# Patient Record
Sex: Female | Born: 1960
Health system: Southern US, Community
[De-identification: ages and names within clinical notes are randomized; demographics above are authoritative.]

## PROBLEM LIST (undated history)

## (undated) DIAGNOSIS — E785 Hyperlipidemia, unspecified: Secondary | ICD-10-CM

## (undated) DIAGNOSIS — I4891 Unspecified atrial fibrillation: Secondary | ICD-10-CM

## (undated) DIAGNOSIS — R202 Paresthesia of skin: Secondary | ICD-10-CM

## (undated) DIAGNOSIS — K219 Gastro-esophageal reflux disease without esophagitis: Secondary | ICD-10-CM

## (undated) DIAGNOSIS — M797 Fibromyalgia: Secondary | ICD-10-CM

## (undated) DIAGNOSIS — E119 Type 2 diabetes mellitus without complications: Secondary | ICD-10-CM

## (undated) DIAGNOSIS — R200 Anesthesia of skin: Secondary | ICD-10-CM

## (undated) DIAGNOSIS — K449 Diaphragmatic hernia without obstruction or gangrene: Secondary | ICD-10-CM

## (undated) DIAGNOSIS — F988 Other specified behavioral and emotional disorders with onset usually occurring in childhood and adolescence: Secondary | ICD-10-CM

## (undated) DIAGNOSIS — E079 Disorder of thyroid, unspecified: Secondary | ICD-10-CM

## (undated) DIAGNOSIS — E279 Disorder of adrenal gland, unspecified: Secondary | ICD-10-CM

## (undated) DIAGNOSIS — D509 Iron deficiency anemia, unspecified: Secondary | ICD-10-CM

## (undated) DIAGNOSIS — E669 Obesity, unspecified: Secondary | ICD-10-CM

## (undated) HISTORY — DX: Paresthesia of skin: R20.0

## (undated) HISTORY — DX: Obesity, unspecified: E66.9

## (undated) HISTORY — PX: KNEE SURGERY: SHX244

## (undated) HISTORY — PX: TONSILLECTOMY: SUR1361

## (undated) HISTORY — DX: Hyperlipidemia, unspecified: E78.5

## (undated) HISTORY — DX: Fibromyalgia: M79.7

## (undated) HISTORY — PX: ABDOMINAL HYSTERECTOMY: SHX81

## (undated) HISTORY — DX: Anesthesia of skin: R20.2

## (undated) HISTORY — PX: SHOULDER SURGERY: SHX246

## (undated) HISTORY — DX: Iron deficiency anemia, unspecified: D50.9

## (undated) HISTORY — DX: Gastro-esophageal reflux disease without esophagitis: K21.9

## (undated) HISTORY — PX: COLONOSCOPY: SHX174

## (undated) HISTORY — DX: Other specified behavioral and emotional disorders with onset usually occurring in childhood and adolescence: F98.8

## (undated) HISTORY — DX: Diaphragmatic hernia without obstruction or gangrene: K44.9

---

## 1999-02-17 ENCOUNTER — Emergency Department (HOSPITAL_COMMUNITY): Admission: EM | Admit: 1999-02-17 | Discharge: 1999-02-17 | Payer: Self-pay | Admitting: Internal Medicine

## 1999-10-19 ENCOUNTER — Emergency Department (HOSPITAL_COMMUNITY): Admission: EM | Admit: 1999-10-19 | Discharge: 1999-10-19 | Payer: Self-pay | Admitting: Emergency Medicine

## 1999-12-26 ENCOUNTER — Encounter: Admission: RE | Admit: 1999-12-26 | Discharge: 1999-12-26 | Payer: Self-pay | Admitting: Obstetrics & Gynecology

## 1999-12-26 ENCOUNTER — Encounter: Payer: Self-pay | Admitting: Obstetrics & Gynecology

## 2000-01-09 ENCOUNTER — Encounter: Payer: Self-pay | Admitting: Internal Medicine

## 2000-01-09 ENCOUNTER — Ambulatory Visit (HOSPITAL_COMMUNITY): Admission: RE | Admit: 2000-01-09 | Discharge: 2000-01-09 | Payer: Self-pay | Admitting: Internal Medicine

## 2000-02-06 ENCOUNTER — Encounter: Payer: Self-pay | Admitting: Internal Medicine

## 2000-02-06 ENCOUNTER — Encounter: Admission: RE | Admit: 2000-02-06 | Discharge: 2000-02-06 | Payer: Self-pay | Admitting: Internal Medicine

## 2000-02-13 ENCOUNTER — Encounter: Payer: Self-pay | Admitting: Internal Medicine

## 2000-02-13 ENCOUNTER — Encounter: Admission: RE | Admit: 2000-02-13 | Discharge: 2000-02-13 | Payer: Self-pay | Admitting: Internal Medicine

## 2000-06-09 ENCOUNTER — Encounter: Payer: Self-pay | Admitting: Emergency Medicine

## 2000-06-09 ENCOUNTER — Inpatient Hospital Stay (HOSPITAL_COMMUNITY): Admission: EM | Admit: 2000-06-09 | Discharge: 2000-06-11 | Payer: Self-pay | Admitting: Emergency Medicine

## 2000-07-19 ENCOUNTER — Emergency Department (HOSPITAL_COMMUNITY): Admission: EM | Admit: 2000-07-19 | Discharge: 2000-07-19 | Payer: Self-pay | Admitting: Emergency Medicine

## 2000-07-19 ENCOUNTER — Encounter: Payer: Self-pay | Admitting: Emergency Medicine

## 2000-12-17 ENCOUNTER — Other Ambulatory Visit: Admission: RE | Admit: 2000-12-17 | Discharge: 2000-12-17 | Payer: Self-pay | Admitting: Family Medicine

## 2004-12-05 ENCOUNTER — Encounter (INDEPENDENT_AMBULATORY_CARE_PROVIDER_SITE_OTHER): Payer: Self-pay | Admitting: Specialist

## 2004-12-05 ENCOUNTER — Observation Stay (HOSPITAL_COMMUNITY): Admission: RE | Admit: 2004-12-05 | Discharge: 2004-12-06 | Payer: Self-pay | Admitting: Obstetrics & Gynecology

## 2004-12-09 ENCOUNTER — Inpatient Hospital Stay (HOSPITAL_COMMUNITY): Admission: AD | Admit: 2004-12-09 | Discharge: 2004-12-09 | Payer: Self-pay | Admitting: Obstetrics and Gynecology

## 2005-02-04 ENCOUNTER — Ambulatory Visit: Payer: Self-pay | Admitting: Family Medicine

## 2005-03-13 ENCOUNTER — Encounter: Admission: RE | Admit: 2005-03-13 | Discharge: 2005-03-13 | Payer: Self-pay | Admitting: Family Medicine

## 2005-03-13 ENCOUNTER — Ambulatory Visit: Payer: Self-pay | Admitting: Family Medicine

## 2005-09-12 ENCOUNTER — Ambulatory Visit: Payer: Self-pay | Admitting: Family Medicine

## 2006-03-13 ENCOUNTER — Ambulatory Visit: Payer: Self-pay | Admitting: Family Medicine

## 2006-03-20 ENCOUNTER — Ambulatory Visit: Payer: Self-pay | Admitting: Family Medicine

## 2006-04-17 ENCOUNTER — Ambulatory Visit: Payer: Self-pay | Admitting: Family Medicine

## 2006-04-17 LAB — CONVERTED CEMR LAB
ALT: 10 units/L (ref 0–40)
AST: 15 units/L (ref 0–37)
Albumin: 3.7 g/dL (ref 3.5–5.2)
Alkaline Phosphatase: 73 units/L (ref 39–117)
BUN: 7 mg/dL (ref 6–23)
Basophils Absolute: 0 10*3/uL (ref 0.0–0.1)
Basophils Relative: 0.9 % (ref 0.0–1.0)
Bilirubin, Direct: 0.1 mg/dL (ref 0.0–0.3)
CO2: 30 meq/L (ref 19–32)
Calcium: 9.1 mg/dL (ref 8.4–10.5)
Chloride: 104 meq/L (ref 96–112)
Cholesterol: 208 mg/dL (ref 0–200)
Creatinine, Ser: 0.7 mg/dL (ref 0.4–1.2)
Direct LDL: 143.6 mg/dL
Eosinophils Absolute: 0.2 10*3/uL (ref 0.0–0.6)
Eosinophils Relative: 4 % (ref 0.0–5.0)
GFR calc Af Amer: 116 mL/min
GFR calc non Af Amer: 96 mL/min
Glucose, Bld: 96 mg/dL (ref 70–99)
HCT: 41.4 % (ref 36.0–46.0)
HDL: 43.9 mg/dL (ref >39.0)
Hemoglobin: 14.3 g/dL (ref 12.0–15.0)
Lymphocytes Relative: 36.2 % (ref 12.0–46.0)
MCHC: 34.5 g/dL (ref 30.0–36.0)
MCV: 88.5 fL (ref 78.0–100.0)
Monocytes Absolute: 0.3 10*3/uL (ref 0.2–0.7)
Monocytes Relative: 8.7 % (ref 3.0–11.0)
Neutro Abs: 2 10*3/uL (ref 1.4–7.7)
Neutrophils Relative %: 50.2 % (ref 43.0–77.0)
Platelets: 289 10*3/uL (ref 150–400)
Potassium: 3.6 meq/L (ref 3.5–5.1)
RBC: 4.68 M/uL (ref 3.87–5.11)
RDW: 13.2 % (ref 11.5–14.6)
Sodium: 139 meq/L (ref 135–145)
TSH: 1.94 microintl units/mL (ref 0.35–5.50)
Total Bilirubin: 0.8 mg/dL (ref 0.3–1.2)
Total CHOL/HDL Ratio: 4.7
Total Protein: 6.7 g/dL (ref 6.0–8.3)
Triglycerides: 74 mg/dL (ref 0–149)
VLDL: 15 mg/dL (ref 0–40)
WBC: 3.9 10*3/uL — ABNORMAL LOW (ref 4.5–10.5)

## 2006-06-03 ENCOUNTER — Ambulatory Visit: Payer: Self-pay | Admitting: Family Medicine

## 2006-06-25 ENCOUNTER — Ambulatory Visit: Payer: Self-pay | Admitting: Family Medicine

## 2006-12-15 ENCOUNTER — Telehealth: Payer: Self-pay | Admitting: Family Medicine

## 2006-12-18 ENCOUNTER — Encounter: Payer: Self-pay | Admitting: Family Medicine

## 2007-01-13 DIAGNOSIS — J4530 Mild persistent asthma, uncomplicated: Secondary | ICD-10-CM | POA: Insufficient documentation

## 2007-01-13 DIAGNOSIS — D509 Iron deficiency anemia, unspecified: Secondary | ICD-10-CM | POA: Insufficient documentation

## 2007-01-19 ENCOUNTER — Telehealth: Payer: Self-pay | Admitting: *Deleted

## 2007-11-09 ENCOUNTER — Telehealth: Payer: Self-pay | Admitting: *Deleted

## 2007-11-30 ENCOUNTER — Ambulatory Visit: Payer: Self-pay | Admitting: Family Medicine

## 2007-11-30 DIAGNOSIS — F988 Other specified behavioral and emotional disorders with onset usually occurring in childhood and adolescence: Secondary | ICD-10-CM | POA: Insufficient documentation

## 2007-12-07 ENCOUNTER — Telehealth: Payer: Self-pay | Admitting: Family Medicine

## 2007-12-23 ENCOUNTER — Encounter: Payer: Self-pay | Admitting: Family Medicine

## 2008-07-19 ENCOUNTER — Telehealth: Payer: Self-pay | Admitting: Family Medicine

## 2008-08-05 ENCOUNTER — Telehealth: Payer: Self-pay | Admitting: Family Medicine

## 2008-09-07 ENCOUNTER — Ambulatory Visit: Payer: Self-pay | Admitting: Family Medicine

## 2008-09-07 LAB — CONVERTED CEMR LAB
ALT: 10 units/L (ref 0–35)
AST: 17 units/L (ref 0–37)
Albumin: 3.6 g/dL (ref 3.5–5.2)
Alkaline Phosphatase: 69 units/L (ref 39–117)
BUN: 9 mg/dL (ref 6–23)
Basophils Absolute: 0 10*3/uL (ref 0.0–0.1)
Basophils Relative: 0.3 % (ref 0.0–3.0)
Bilirubin Urine: NEGATIVE
Bilirubin, Direct: 0.1 mg/dL (ref 0.0–0.3)
Blood in Urine, dipstick: NEGATIVE
CO2: 31 meq/L (ref 19–32)
Calcium: 8.9 mg/dL (ref 8.4–10.5)
Chloride: 105 meq/L (ref 96–112)
Cholesterol: 192 mg/dL (ref 0–200)
Creatinine, Ser: 0.7 mg/dL (ref 0.4–1.2)
Eosinophils Absolute: 0.2 10*3/uL (ref 0.0–0.7)
Eosinophils Relative: 4.9 % (ref 0.0–5.0)
GFR calc non Af Amer: 114.63 mL/min (ref >60)
Glucose, Bld: 93 mg/dL (ref 70–99)
Glucose, Urine, Semiquant: NEGATIVE
HCT: 40.2 % (ref 36.0–46.0)
HDL: 46.1 mg/dL (ref >39.00)
Hemoglobin: 13.8 g/dL (ref 12.0–15.0)
Ketones, urine, test strip: NEGATIVE
LDL Cholesterol: 131 mg/dL — ABNORMAL HIGH (ref 0–99)
Lymphocytes Relative: 39.8 % (ref 12.0–46.0)
Lymphs Abs: 2 10*3/uL (ref 0.7–4.0)
MCHC: 34.5 g/dL (ref 30.0–36.0)
MCV: 90.2 fL (ref 78.0–100.0)
Monocytes Absolute: 0.4 10*3/uL (ref 0.1–1.0)
Monocytes Relative: 7.4 % (ref 3.0–12.0)
Neutro Abs: 2.5 10*3/uL (ref 1.4–7.7)
Neutrophils Relative %: 47.6 % (ref 43.0–77.0)
Nitrite: NEGATIVE
Platelets: 249 10*3/uL (ref 150.0–400.0)
Potassium: 3.8 meq/L (ref 3.5–5.1)
RBC: 4.45 M/uL (ref 3.87–5.11)
RDW: 13.6 % (ref 11.5–14.6)
Sodium: 142 meq/L (ref 135–145)
Specific Gravity, Urine: 1.02
TSH: 1.73 microintl units/mL (ref 0.35–5.50)
Total Bilirubin: 1.1 mg/dL (ref 0.3–1.2)
Total CHOL/HDL Ratio: 4
Total Protein: 6.7 g/dL (ref 6.0–8.3)
Triglycerides: 76 mg/dL (ref 0.0–149.0)
Urobilinogen, UA: 4
VLDL: 15.2 mg/dL (ref 0.0–40.0)
WBC Urine, dipstick: NEGATIVE
WBC: 5.1 10*3/uL (ref 4.5–10.5)
pH: 7

## 2008-09-20 ENCOUNTER — Ambulatory Visit: Payer: Self-pay | Admitting: Internal Medicine

## 2008-10-05 ENCOUNTER — Ambulatory Visit: Payer: Self-pay | Admitting: Family Medicine

## 2008-10-11 ENCOUNTER — Ambulatory Visit: Payer: Self-pay | Admitting: Internal Medicine

## 2008-10-20 ENCOUNTER — Telehealth: Payer: Self-pay | Admitting: *Deleted

## 2008-11-04 ENCOUNTER — Ambulatory Visit: Payer: Self-pay | Admitting: Internal Medicine

## 2008-11-04 ENCOUNTER — Encounter: Payer: Self-pay | Admitting: Family Medicine

## 2009-01-06 ENCOUNTER — Encounter: Payer: Self-pay | Admitting: *Deleted

## 2009-03-03 ENCOUNTER — Encounter (INDEPENDENT_AMBULATORY_CARE_PROVIDER_SITE_OTHER): Payer: Self-pay | Admitting: *Deleted

## 2009-03-06 ENCOUNTER — Telehealth: Payer: Self-pay | Admitting: Family Medicine

## 2009-04-21 DIAGNOSIS — M5020 Other cervical disc displacement, unspecified cervical region: Secondary | ICD-10-CM | POA: Insufficient documentation

## 2009-04-25 ENCOUNTER — Encounter: Payer: Self-pay | Admitting: Family Medicine

## 2009-04-26 ENCOUNTER — Ambulatory Visit: Payer: Self-pay | Admitting: Family Medicine

## 2009-04-26 ENCOUNTER — Telehealth: Payer: Self-pay | Admitting: Family Medicine

## 2009-05-01 ENCOUNTER — Telehealth: Payer: Self-pay | Admitting: Family Medicine

## 2009-05-02 ENCOUNTER — Telehealth: Payer: Self-pay | Admitting: Family Medicine

## 2009-05-03 ENCOUNTER — Ambulatory Visit: Payer: Self-pay | Admitting: Family Medicine

## 2009-05-15 ENCOUNTER — Telehealth (INDEPENDENT_AMBULATORY_CARE_PROVIDER_SITE_OTHER): Payer: Self-pay | Admitting: *Deleted

## 2009-06-06 ENCOUNTER — Encounter: Payer: Self-pay | Admitting: Family Medicine

## 2009-06-23 ENCOUNTER — Telehealth: Payer: Self-pay | Admitting: Family Medicine

## 2009-07-13 ENCOUNTER — Emergency Department (HOSPITAL_BASED_OUTPATIENT_CLINIC_OR_DEPARTMENT_OTHER): Admission: EM | Admit: 2009-07-13 | Discharge: 2009-07-13 | Payer: Self-pay | Admitting: Emergency Medicine

## 2009-07-28 ENCOUNTER — Ambulatory Visit: Payer: Self-pay | Admitting: Family Medicine

## 2009-07-28 ENCOUNTER — Telehealth: Payer: Self-pay | Admitting: Internal Medicine

## 2009-07-31 ENCOUNTER — Telehealth: Payer: Self-pay | Admitting: Family Medicine

## 2009-08-01 ENCOUNTER — Ambulatory Visit: Payer: Self-pay | Admitting: Family Medicine

## 2009-10-26 ENCOUNTER — Telehealth: Payer: Self-pay | Admitting: Family Medicine

## 2009-11-24 ENCOUNTER — Ambulatory Visit: Payer: Self-pay | Admitting: Family Medicine

## 2009-11-24 DIAGNOSIS — R1013 Epigastric pain: Secondary | ICD-10-CM | POA: Insufficient documentation

## 2009-12-05 ENCOUNTER — Encounter: Admission: RE | Admit: 2009-12-05 | Discharge: 2009-12-05 | Payer: Self-pay | Admitting: Family Medicine

## 2009-12-07 ENCOUNTER — Telehealth: Payer: Self-pay | Admitting: Family Medicine

## 2009-12-12 ENCOUNTER — Ambulatory Visit: Payer: Self-pay | Admitting: Family Medicine

## 2010-01-03 ENCOUNTER — Telehealth: Payer: Self-pay | Admitting: Family Medicine

## 2010-01-13 ENCOUNTER — Emergency Department (HOSPITAL_BASED_OUTPATIENT_CLINIC_OR_DEPARTMENT_OTHER): Admission: EM | Admit: 2010-01-13 | Discharge: 2010-01-13 | Payer: Self-pay | Admitting: Emergency Medicine

## 2010-01-13 ENCOUNTER — Ambulatory Visit: Payer: Self-pay | Admitting: Diagnostic Radiology

## 2010-02-01 ENCOUNTER — Ambulatory Visit: Payer: Self-pay | Admitting: Family Medicine

## 2010-02-01 DIAGNOSIS — M2390 Unspecified internal derangement of unspecified knee: Secondary | ICD-10-CM | POA: Insufficient documentation

## 2010-02-05 ENCOUNTER — Encounter (INDEPENDENT_AMBULATORY_CARE_PROVIDER_SITE_OTHER): Payer: Self-pay | Admitting: *Deleted

## 2010-02-05 ENCOUNTER — Encounter: Payer: Self-pay | Admitting: Family Medicine

## 2010-02-23 ENCOUNTER — Encounter: Payer: Self-pay | Admitting: Family Medicine

## 2010-03-02 ENCOUNTER — Encounter: Payer: Self-pay | Admitting: Family Medicine

## 2010-04-10 ENCOUNTER — Telehealth: Payer: Self-pay | Admitting: Family Medicine

## 2010-04-11 ENCOUNTER — Telehealth: Payer: Self-pay | Admitting: Family Medicine

## 2010-04-17 NOTE — Assessment & Plan Note (Signed)
Summary: asthma/dm   Vital Signs:  Patient profile:   50 year old female Height:      63 inches Weight:      172 pounds BMI:     30.58 Temp:     98.2 degrees F oral BP sitting:   110 / 78  (left arm) Cuff size:   regular  Vitals Entered By: Kern Reap CMA (September 07, 2008 9:35 AM)  Reason for Visit asthma  History of Present Illness: Carrie [name] is a 50 year old female, nonsmoker, with a history of severe asthma requiring multiple admissions in her teenage years with 3 or more episodes of ventilator care.  The comes in today with a 7-day history of wheezing.  Her current medications are Singulair 10 mg daily and Provera, one or two puffs b.i.d., p.r.n.  She, says she's not changed her medicine, except she we started Ventolin.  She feels much better than she did last week.  Her last severe episode was over a year ago.  That episode.  We treated with oral prednisone.  She is due for complete physical examination.  Allergies: 1)  Penicillin G Potassium (Penicillin G Potassium) 2)  Aspirin (Aspirin) 3)  Codeine Phosphate (Codeine Phosphate)  Past History:  Past medical, surgical, family and social histories (including risk factors) reviewed, and no changes noted (except as noted below).  Past Medical History: Reviewed history from 01/13/2007 and no changes required. ADD Fibromyalgia MHA Obese Anemia-iron deficiency Asthma  Past Surgical History: Reviewed history from 01/13/2007 and no changes required. CB x2 Colonoscopy  Family History: Reviewed history from 01/13/2007 and no changes required. Family History of Asthma Family History of Colon CA 1st degree relative <60 Family History Lung cancer Family History of Respiratory disease  Social History: Reviewed history from 01/13/2007 and no changes required. Occupation: Married Never Smoked Alcohol use-yes  Review of Systems      See HPI  Physical Exam  General:  Well-developed,well-nourished,in no acute distress;  alert,appropriate and cooperative throughout examination Head:  Normocephalic and atraumatic without obvious abnormalities. No apparent alopecia or balding. Eyes:  No corneal or conjunctival inflammation noted. EOMI. Perrla. Funduscopic exam benign, without hemorrhages, exudates or papilledema. Vision grossly normal. Ears:  External ear exam shows no significant lesions or deformities.  Otoscopic examination reveals clear canals, tympanic membranes are intact bilaterally without bulging, retraction, inflammation or discharge. Hearing is grossly normal bilaterally. Nose:  External nasal examination shows no deformity or inflammation. Nasal mucosa are pink and moist without lesions or exudates. Mouth:  Oral mucosa and oropharynx without lesions or exudates.  Teeth in good repair. Neck:  No deformities, masses, or tenderness noted. Chest Wall:  No deformities, masses, or tenderness noted. Lungs:  symmetrical breath sounds, late expiratory wheezing   Impression & Recommendations:  Problem # 1:  ASTHMA (ICD-493.90) Assessment Deteriorated  Her updated medication list for this problem includes:    Singulair 10 Mg Tabs (Montelukast sodium) .Marland Kitchen... Take 1 tablet by mouth every morning    Proair Hfa 108 (90 Base) Mcg/act Aers (Albuterol sulfate) .Marland Kitchen..Marland Kitchen Two puffs as needed for asthma    Qvar 80 Mcg/act Aers (Beclomethasone dipropionate) .Marland Kitchen... 2 ps two times a day  Orders: Prescription Created Electronically [phone]) Venipuncture (60454) UA Dipstick w/o Micro (automated)  (81003) TLB-Lipid Panel (80061-LIPID) TLB-BMP (Basic Metabolic Panel-BMET) (80048-METABOL) TLB-CBC Platelet - w/Differential (85025-CBCD) TLB-Hepatic/Liver Function Pnl (80076-HEPATIC) TLB-TSH (Thyroid Stimulating Hormone) (84443-TSH)  Complete Medication List: 1)  Detrol La 2 Mg Cp24 (Tolterodine tartrate) .... Take 1 capsule by mouth  once a day 2)  Hydrochlorothiazide 25 Mg Tabs (Hydrochlorothiazide) .... Take 1 tablet by mouth  every morning 3)  Adderall 10 Mg Tabs (Amphetamine-dextroamphetamine) .... Take by mouth @ 1 pm 4)  Adderall Xr 20 Mg Cp24 (Amphetamine-dextroamphetamine) .... Take 2 tabs every am 5)  Adderall Xr 20 Mg Cp24 (Amphetamine-dextroamphetamine) .... Take 2 tabs every am  fill in one month 6)  Singulair 10 Mg Tabs (Montelukast sodium) .... Take 1 tablet by mouth every morning 7)  Proair Hfa 108 (90 Base) Mcg/act Aers (Albuterol sulfate) .... Two puffs as needed for asthma 8)  Adderall 10 Mg Tabs (Amphetamine-dextroamphetamine) .... Take one tab at 1 pm  fill in one month 9)  Adderall 10 Mg Tabs (Amphetamine-dextroamphetamine) .... Take one tab at 1 pm  fill in two months 10)  Adderall Xr 20 Mg Xr24h-cap (Amphetamine-dextroamphetamine) .... Take 2 tabs every morning  fill in two months 11)  Qvar 80 Mcg/act Aers (Beclomethasone dipropionate) .... 2 ps two times a day  Other Orders: Gastroenterology Referral (GI)  Patient Instructions: 1)  add Qvar two puffs b.i.d.Marland Kitchen 2)  Call Dr. Reather Converse for an appointment for consultation. 3)  We will get your lab work today.  Set up a time, the third or fourth week in July for a 30 minute appointment. 4)  I will also put in a request for GI concerning your colonoscopy  Prescriptions: QVAR 80 MCG/ACT AERS (BECLOMETHASONE DIPROPIONATE) 2 ps two times a day  #3 x 3   Entered and Authorized by:   Roderick Pee MD   Signed by:   Roderick Pee MD on 09/07/2008   Method used:   Electronically to        [facility]       [address]       [place], [place]  [address]       Ph: [phone] or [phone]       Fax: [phone]   RxID:   [id number]   Laboratory Results   Urine Tests    Routine Urinalysis   Color: orange Appearance: Cloudy Glucose: negative   (Normal Range: Negative) Bilirubin: negative   (Normal Range: Negative) Ketone: negative   (Normal Range: Negative) Spec. Gravity: 1.020   (Normal Range: 1.003-1.035) Blood:  negative   (Normal Range: Negative) pH: 7.0   (Normal Range: 5.0-8.0) Protein: 1+   (Normal Range: Negative) Urobilinogen: 4.0   (Normal Range: 0-1) Nitrite: negative   (Normal Range: Negative) Leukocyte Esterace: negative   (Normal Range: Negative)    Comments: Joanne Chars CMA  September 07, 2008 1:32 PM

## 2010-04-17 NOTE — Miscellaneous (Signed)
Summary: mammogram update  Clinical Lists Changes  Observations: Added new observation of MAMMOGRAM: normal (01/06/2009 16:43)      Preventive Care Screening  Mammogram:    Date:  01/06/2009    Results:  normal

## 2010-04-17 NOTE — Letter (Signed)
Summary: Generic Letter  [facility]  [address] [place], [place] [address]   Phone: [phone]  Fax: [phone]    02/05/2010  [facility] [address] [place], [place]  [address]  To Whom it May Concern,  Our patient was seen in our office 02-01-2010.  She has been medically cleared for surgery. If you have any questions or concerns please feel free to call our office.           Sincerely,   Kelle Darting, MD

## 2010-04-17 NOTE — Progress Notes (Signed)
Summary: Call-A-Nurse Report    Call-A-Nurse Triage Call Report Triage Record Num: [id number] Operator: Jacques Navy Patient Name: Lynda Wanninger Call Date & Time: 07/28/2009 7:09:05PM Patient Phone: [phone] PCP: Eugenio Hoes. Tayra Dawe Patient Gender: Female PCP Fax : [phone] Patient DOB: Nov 21, 1960 [facility] Name: [facility] Reason for Call: Seen Dr. Tawanna Cooler 07-28-09 and 4 scripts were suppose to be called to pharmacy and one was missed. Needs the QVar inhaler 2puffs daily still. Paged Dr. Alphonsus Sias @ [phone] 07-28-09.Orders received to fill. [facility]ses [facility] [phone] @ [phone] 07-28-09 and spoke to Liborio Nixon, Teacher, early years/pre. Protocol(s) Used: PCP Calls, No Triage (Adult) Recommended Outcome per Protocol: Call Provider Immediately Reason for Outcome: [1] Follow-up call from parent regarding patient's clinical status AND [2] information urgent Care Advice:  ~ 07/28/2009 7:19:25PM Page 1 of 1 [email or web address]

## 2010-04-17 NOTE — Progress Notes (Signed)
Summary: please call  Phone Note Call from Patient   Caller: Patient Call For: Roderick Pee MD Summary of Call: pt is requesting rachel to call her at cell # [phone] Initial call taken by: Heron Sabins,  May 02, 2009 5:08 PM  Follow-up for Phone Call        Called pt at [phone] and left msg to call back..... WIll attempt to contact pt again,  later today. Follow-up by: Debbra Riding,  May 03, 2009 9:34 AM  Additional Follow-up for Phone Call Additional follow up Details #1::        Phone Call Completed-----Pt called and wanted to ck status of disability paperwork to [facility] Claims.... Pt wanted to know about her RTW date... Pt was adv that same was being left open at this time.... Pt acknowledged same. Additional Follow-up by: Debbra Riding,  May 03, 2009 10:03 AM

## 2010-04-17 NOTE — Letter (Signed)
Summary: Physician's Certification for Disability Benefits  Physician's Certification for Disability Benefits   Imported By: Maryln Gottron 05/09/2009 12:32:07  _____________________________________________________________________  External Attachment:    Type:   Image     Comment:   External Document

## 2010-04-17 NOTE — Progress Notes (Signed)
  Phone Note Call from Patient Call back at Work Phone [phone]   Caller: Patient Call For: Roderick Pee MD Reason for Call: Lab or Test Results Action Taken: Provider Notified Summary of Call: Pt is asking for [place] results, please. [phone] Initial call taken by: Lynann Beaver CMA,  December 07, 2009 3:11 PM  Follow-up for Phone Call        I reviewed her test results.  Ultrasound the gallbladder, and the abdomen is normal except for a cystic lesion left kidney.  Recommend follow-up in 12 months.  Patient informed of this fact.  Return next week for follow-up on the asthma Follow-up by: Roderick Pee MD,  December 08, 2009 1:42 PM

## 2010-04-17 NOTE — Progress Notes (Signed)
Summary: rxs pt will be out of meds monday  Phone Note Call from Patient Call back at Home Phone [phone] Call back at [phone]   Caller: Patient Call For: dr todd Summary of Call: pt will be out of meds on monday. adderall xr 20 mg and adderall 10 mg Initial call taken by: Heron Sabins,  Aug 05, 2008 10:58 AM  Follow-up for Phone Call        rx ready. left message on machine for patient  Follow-up by: Kern Reap CMA,  Aug 08, 2008 9:32 AM    New/Updated Medications: ADDERALL XR 20 MG  CP24 (AMPHETAMINE-DEXTROAMPHETAMINE) Take 2 tabs every AM  fill in one month ADDERALL 10 MG TABS (AMPHETAMINE-DEXTROAMPHETAMINE) take one tab at 1 pm  fill in one month ADDERALL 10 MG TABS (AMPHETAMINE-DEXTROAMPHETAMINE) take one tab at 1 pm  fill in two months ADDERALL XR 20 MG XR24H-CAP (AMPHETAMINE-DEXTROAMPHETAMINE) take 2 tabs every morning  fill in two months   Prescriptions: ADDERALL XR 20 MG XR24H-CAP (AMPHETAMINE-DEXTROAMPHETAMINE) take 2 tabs every morning  fill in two months  #60 x 0   Entered by:   Kern Reap CMA   Authorized by:   Roderick Pee MD   Signed by:   Kern Reap CMA on 08/08/2008   Method used:   Print then Give to Patient   RxID:   [id number] ADDERALL XR 20 MG  CP24 (AMPHETAMINE-DEXTROAMPHETAMINE) Take 2 tabs every AM  fill in one month  #60 x 0   Entered by:   Kern Reap CMA   Authorized by:   Roderick Pee MD   Signed by:   Kern Reap CMA on 08/08/2008   Method used:   Print then Give to Patient   RxID:   [id number] ADDERALL XR 20 MG  CP24 (AMPHETAMINE-DEXTROAMPHETAMINE) Take 2 tabs every AM  #60 x 0   Entered by:   Kern Reap CMA   Authorized by:   Roderick Pee MD   Signed by:   Kern Reap CMA on 08/08/2008   Method used:   Print then Give to Patient   RxID:   [id number] ADDERALL 10 MG TABS (AMPHETAMINE-DEXTROAMPHETAMINE) take one tab at 1 pm  fill in two months  #30 x 0   Entered by:   Kern Reap CMA  Authorized by:   Roderick Pee MD   Signed by:   Kern Reap CMA on 08/08/2008   Method used:   Print then Give to Patient   RxID:   [id number] ADDERALL 10 MG TABS (AMPHETAMINE-DEXTROAMPHETAMINE) take one tab at 1 pm  fill in one month  #30 x 0   Entered by:   Kern Reap CMA   Authorized by:   Roderick Pee MD   Signed by:   Kern Reap CMA on 08/08/2008   Method used:   Print then Give to Patient   RxID:   [id number] ADDERALL 10 MG  TABS (AMPHETAMINE-DEXTROAMPHETAMINE) TAKE by mouth @ 1 PM  #30 x 0   Entered by:   Kern Reap CMA   Authorized by:   Roderick Pee MD   Signed by:   Kern Reap CMA on 08/08/2008   Method used:   Print then Give to Patient   RxID:   [id number]

## 2010-04-17 NOTE — Progress Notes (Signed)
  Phone Note Other Incoming   [facility] Summary of Call: concentrated albuterol ordered that they don't stock okay to change to nebulizer vials Initial call taken by: Cindee Salt MD,  Jul 28, 2009 5:43 PM    New/Updated Medications: ALBUTEROL SULFATE (2.5 MG/3ML) 0.083% NEBU (ALBUTEROL SULFATE) 1 via nebulizer up to 4 times daily as needed Prescriptions: ALBUTEROL SULFATE (2.5 MG/3ML) 0.083% NEBU (ALBUTEROL SULFATE) 1 via nebulizer up to 4 times daily as needed  #120 x 0   Entered and Authorized by:   Cindee Salt MD   Signed by:   Cindee Salt MD on 07/28/2009   Method used:   Telephoned to ...       [facility]       [address]       [place], [place]  [address]       Ph: [phone] or [phone]       Fax: [phone]   RxID:   [id number]

## 2010-04-17 NOTE — Assessment & Plan Note (Signed)
Summary: cpx/mhf perdrtodde   Vital Signs:  Patient profile:   50 year old female Menstrual status:  hysterectomy Height:      64.5 inches Weight:      173 pounds Temp:     98.2 degrees F oral BP sitting:   110 / 80  (left arm)  Vitals Entered By: Kern Reap CMA (October 05, 2008 8:28 AM)  Reason for Visit cpx  History of Present Illness: Carrie [name] is a 50 year old female, who comes in today for physical evaluation of many issues.  She has a long-standing history of severe asthma.  She said it over 100 hospitalizations for asthma.  She currently takes Singulair 10 mg daily Qvar 80 2 ps  b.i.d. and states she is doing well.  She was counseled to see Dr. Tenny Craw in [place] for an immunological evaluation.  She hasn't gone yet.  Again, encouraged to see Dr. Reather Converse, ASAP.  She also has a history of underlying urinary incontinence.  She takes Detrol 2 mg daily working well.  She also has a history of venous insufficiency.  She is on HCTZ 25 mg daily.  No edema.  BP good 110/80.  She also has a history of ADD.  She currently takes 40 mg of long-acting Adderall in the morning and 10 mg at 1 p.m.Marland Kitchen  She gets routine eye care, yearly excess of the long-term steroid use.  She currently has braces on.  She does BSE monthly and gets annual mammography.  She had her uterus removed in 2006 for fibroids.  Ovaries were left intact.  Tetanus Versed 2003, seasonal flu every fall, pneumonia shot, 08,  She has a colonoscopy set up next week because her mother died of colon cancer.  Allergies: 1)  Penicillin G Potassium (Penicillin G Potassium) 2)  Aspirin (Aspirin) 3)  Codeine Phosphate (Codeine Phosphate)  Past History:  Past medical, surgical, family and social histories (including risk factors) reviewed, and no changes noted (except as noted below).  Past Medical History: Reviewed history from 01/13/2007 and no changes required. ADD Fibromyalgia MHA Obese Anemia-iron  deficiency Asthma  Past Surgical History: Reviewed history from 01/13/2007 and no changes required. CB x2 Colonoscopy  Family History: Reviewed history from 01/13/2007 and no changes required. Family History of Asthma Family History of Colon CA 1st degree relative <60 Family History Lung cancer Family History of Respiratory disease  Social History: Reviewed history from 01/13/2007 and no changes required. Occupation: Married Never Smoked Alcohol use-yes  Review of Systems      See HPI  Physical Exam  General:  Well-developed,well-nourished,in no acute distress; alert,appropriate and cooperative throughout examination Head:  Normocephalic and atraumatic without obvious abnormalities. No apparent alopecia or balding. Eyes:  No corneal or conjunctival inflammation noted. EOMI. Perrla. Funduscopic exam benign, without hemorrhages, exudates or papilledema. Vision grossly normal. Ears:  External ear exam shows no significant lesions or deformities.  Otoscopic examination reveals clear canals, tympanic membranes are intact bilaterally without bulging, retraction, inflammation or discharge. Hearing is grossly normal bilaterally. Nose:  External nasal examination shows no deformity or inflammation. Nasal mucosa are pink and moist without lesions or exudates. Mouth:  Oral mucosa and oropharynx without lesions or exudates.  Teeth in good repair. Neck:  No deformities, masses, or tenderness noted. Chest Wall:  No deformities, masses, or tenderness noted. Breasts:  No mass, nodules, thickening, tenderness, bulging, retraction, inflamation, nipple discharge or skin changes noted.   Lungs:  Normal respiratory effort, chest expands symmetrically. Lungs are clear to  auscultation, no crackles or wheezes. Heart:  Normal rate and regular rhythm. S1 and S2 normal without gallop, murmur, click, rub or other extra sounds. Abdomen:  Bowel sounds positive,abdomen soft and non-tender without masses,  organomegaly or hernias noted. Genitalia:  Pelvic Exam:        External: normal female genitalia without lesions or masses        Vagina: normal without lesions or masses        Cervix: normal without lesions or masses        Adnexa: normal bimanual exam without masses or fullness        Uterus: normal by palpation        Pap smear: not performed Msk:  No deformity or scoliosis noted of thoracic or lumbar spine.   Pulses:  R and L carotid,radial,femoral,dorsalis pedis and posterior tibial pulses are full and equal bilaterally Extremities:  No clubbing, cyanosis, edema, or deformity noted with normal full range of motion of all joints.   Neurologic:  No cranial nerve deficits noted. Station and gait are normal. Plantar reflexes are down-going bilaterally. DTRs are symmetrical throughout. Sensory, motor and coordinative functions appear intact. Skin:  Intact without suspicious lesions or rashestattoo posterior upper left shoulder Cervical Nodes:  No lymphadenopathy noted Axillary Nodes:  No palpable lymphadenopathy Inguinal Nodes:  No significant adenopathy Psych:  Cognition and judgment appear intact. Alert and cooperative with normal attention span and concentration. No apparent delusions, illusions, hallucinations   Impression & Recommendations:  Problem # 1:  ATTENTION DEFICIT DISORDER, ADULT (ICD-314.00) Assessment Improved  Orders: Prescription Created Electronically [phone])  Problem # 2:  ASTHMA (ICD-493.90) Assessment: Improved  Her updated medication list for this problem includes:    Singulair 10 Mg Tabs (Montelukast sodium) .Marland Kitchen... Take 1 tablet by mouth every morning    Proair Hfa 108 (90 Base) Mcg/act Aers (Albuterol sulfate) .Marland Kitchen..Marland Kitchen Two puffs as needed for asthma    Qvar 80 Mcg/act Aers (Beclomethasone dipropionate) .Marland Kitchen... 2 ps two times a day  Orders: Prescription Created Electronically [id number]) T-Bone Densitometry [id number])  Problem # 3:  Preventive Health Care  (ICD-V70.0) Assessment: Comment Only  Complete Medication List: 1)  Detrol La 2 Mg Cp24 (Tolterodine tartrate) .... Take 1 capsule by mouth once a day 2)  Hydrochlorothiazide 25 Mg Tabs (Hydrochlorothiazide) .... Take 1 tablet by mouth every morning 3)  Adderall 10 Mg Tabs (Amphetamine-dextroamphetamine) .... Take by mouth @ 1 pm 4)  Adderall Xr 20 Mg Cp24 (Amphetamine-dextroamphetamine) .... Take 2 tabs every am 5)  Adderall Xr 20 Mg Cp24 (Amphetamine-dextroamphetamine) .... Take 2 tabs every am  fill in one month 6)  Singulair 10 Mg Tabs (Montelukast sodium) .... Take 1 tablet by mouth every morning 7)  Proair Hfa 108 (90 Base) Mcg/act Aers (Albuterol sulfate) .... Two puffs as needed for asthma 8)  Adderall 10 Mg Tabs (Amphetamine-dextroamphetamine) .... Take one tab at 1 pm  fill in one month 9)  Adderall 10 Mg Tabs (Amphetamine-dextroamphetamine) .... Take one tab at 1 pm  fill in two months 10)  Adderall Xr 20 Mg Xr24h-cap (Amphetamine-dextroamphetamine) .... Take 2 tabs every morning  fill in two months 11)  Qvar 80 Mcg/act Aers (Beclomethasone dipropionate) .... 2 ps two times a day 12)  Miralax Powd (Polyethylene glycol 3350) .... As per prep  instructions. 13)  Reglan 10 Mg Tabs (Metoclopramide hcl) .... As per prep instructions. 14)  Dulcolax 5 Mg Tbec (Bisacodyl) .... Day before procedure take 2 at 3pm and 2 at  8pm.  Other Orders: EKG w/ Interpretation (93000)  Patient Instructions: 1)  Please schedule a follow-up appointment in 1 year. 2)  Schedule your mammogram. 3)  Schedule a colonoscopy/sigmoidoscopy to help detect colon cancer. 4)  Take calcium +Vitamin D daily. 5)  Take an Aspirin every day. Prescriptions: ADDERALL XR 20 MG  CP24 (AMPHETAMINE-DEXTROAMPHETAMINE) Take 2 tabs every AM  #60 x 0   Entered and Authorized by:   Roderick Pee MD   Signed by:   Roderick Pee MD on 10/05/2008   Method used:   Print then Give to Patient   RxID:   [id number] ADDERALL  XR 20 MG  CP24 (AMPHETAMINE-DEXTROAMPHETAMINE) Take 2 tabs every AM  #60 x 0   Entered and Authorized by:   Roderick Pee MD   Signed by:   Roderick Pee MD on 10/05/2008   Method used:   Print then Give to Patient   RxID:   [id number] ADDERALL XR 20 MG  CP24 (AMPHETAMINE-DEXTROAMPHETAMINE) Take 2 tabs every AM  #60 x 0   Entered and Authorized by:   Roderick Pee MD   Signed by:   Roderick Pee MD on 10/05/2008   Method used:   Print then Give to Patient   RxID:   [id number] ADDERALL 10 MG  TABS (AMPHETAMINE-DEXTROAMPHETAMINE) TAKE by mouth @ 1 PM  #30 x 0   Entered and Authorized by:   Roderick Pee MD   Signed by:   Roderick Pee MD on 10/05/2008   Method used:   Print then Give to Patient   RxID:   [id number] ADDERALL 10 MG  TABS (AMPHETAMINE-DEXTROAMPHETAMINE) TAKE by mouth @ 1 PM  #30 x 0   Entered and Authorized by:   Roderick Pee MD   Signed by:   Roderick Pee MD on 10/05/2008   Method used:   Print then Give to Patient   RxID:   [id number] ADDERALL 10 MG  TABS (AMPHETAMINE-DEXTROAMPHETAMINE) TAKE by mouth @ 1 PM  #30 x 0   Entered and Authorized by:   Roderick Pee MD   Signed by:   Roderick Pee MD on 10/05/2008   Method used:   Print then Give to Patient   RxID:   [id number] PROAIR HFA 108 (90 BASE) MCG/ACT AERS (ALBUTEROL SULFATE) two puffs as needed for asthma  #1 x 2   Entered and Authorized by:   Roderick Pee MD   Signed by:   Roderick Pee MD on 10/05/2008   Method used:   Electronically to        [facility]       [address]       [place], [place]  [address]       Ph: [phone] or [phone]       Fax: [phone]   RxID:   [id number] SINGULAIR 10 MG TABS (MONTELUKAST SODIUM) Take 1 tablet by mouth every morning  #100 x 3   Entered and Authorized by:   Roderick Pee MD   Signed by:   Roderick Pee MD on 10/05/2008   Method used:   Electronically to        [facility]        [address]       [place], [place]  [address]       Ph: [phone] or [phone]       Fax: [phone]  RxID:   [id number] HYDROCHLOROTHIAZIDE 25 MG TABS (HYDROCHLOROTHIAZIDE) Take 1 tablet by mouth every morning  #100 x 3   Entered and Authorized by:   Roderick Pee MD   Signed by:   Roderick Pee MD on 10/05/2008   Method used:   Electronically to        [facility]       [address]       [place], [place]  [address]       Ph: [phone] or [phone]       Fax: [phone]   RxID:   [id number] DETROL LA 2 MG CP24 (TOLTERODINE TARTRATE) Take 1 capsule by mouth once a day  #100 x 3   Entered and Authorized by:   Roderick Pee MD   Signed by:   Roderick Pee MD on 10/05/2008   Method used:   Electronically to        [facility]       [address]       [place], [place]  [address]       Ph: [phone] or [phone]       Fax: [phone]   RxID:   [id number]

## 2010-04-17 NOTE — Progress Notes (Signed)
Summary: rx refill adderall  Phone Note Call from Patient Call back at [phone]   Caller: patient live Call For: Tawanna Cooler Summary of Call: Patient needs Rx Refill for adderall XR 40mg , adderall 5 or 10mg .   Please call when ready to pickup. Initial call taken by: Celine Ahr,  November 09, 2007 11:24 AM  Follow-up for Phone Call        left message on machine for patient.  patient has not been seen in over a year. she will need an office visit and then be able to pick up rx Follow-up by: Kern Reap CMA,  November 09, 2007 12:30 PM

## 2010-04-17 NOTE — Progress Notes (Signed)
Summary: PT or neurosurgeon?  Phone Note Call from Patient   Summary of Call: patient is calling to see if dr Dhilan Brauer is aware that she is scheduled for PT before she goes to the neurosurgeon.  is this okay?  [phone] Initial call taken by: Kern Reap CMA Duncan Dull),  May 01, 2009 3:56 PM  Follow-up for Phone Call        yes Follow-up by: Roderick Pee MD,  May 01, 2009 5:10 PM  Additional Follow-up for Phone Call Additional follow up Details #1::        left message on machine for patient to return call Additional Follow-up by: Kern Reap CMA Duncan Dull),  May 02, 2009 8:42 AM    Additional Follow-up for Phone Call Additional follow up Details #2::    patient is aware Follow-up by: Kern Reap CMA Duncan Dull),  May 02, 2009 5:09 PM

## 2010-04-17 NOTE — Assessment & Plan Note (Signed)
Summary: medical clearance for knee surgery/njr   Vital Signs:  Patient profile:   50 year old female Menstrual status:  hysterectomy Weight:      201 pounds Temp:     98.3 degrees F oral BP sitting:   120 / 80  (left arm) Cuff size:   regular  Vitals Entered By: Kern Reap CMA Duncan Dull) (February 01, 2010 4:35 PM) CC: surgical clearance   CC:  surgical clearance.  History of Present Illness: Carrie [name] is a 50 year old, married female, nonsmoker, with a history of chronic allergic rhinitis and asthma who comes in for preop clearance because of impending surgery November the 29th by Dr. Thomasena Edis for torn cartilage in her left knee.  As a teenager and a young girl.  She had multiple hospital admissions, including 3 or 4 episodes where she had to be on a ventilator for severe asthma.  She currently takes Singulair 10 mg daily, Provera, p.r.n., Qvar 80 2 , puffs b.i.d..  She also takes Mevacor, thiazide q.a.m., 25 mg Detrol 4 mg because of some urinary leakage, and because of her underlying ADD.  She is on Adderall, however, there is a back order, and she is not been able to purchase it.  We discussed options will give her a prescription for vyvanse because of the severe childhood, asthma she's been on large doses of steroids in the past.  She also has an appointment to see Dr. Willa Rough.......... immunologist, November, the 23rd  Allergies: 1)  Penicillin G Potassium (Penicillin G Potassium) 2)  Aspirin (Aspirin) 3)  Codeine Phosphate (Codeine Phosphate)  Past History:  Past medical, surgical, family and social histories (including risk factors) reviewed, and no changes noted (except as noted below).  Past Medical History: Reviewed history from 01/13/2007 and no changes required. ADD Fibromyalgia MHA Obese Anemia-iron deficiency Asthma  Past Surgical History: Reviewed history from 11/24/2009 and no changes required. CB x2 Colonoscopy Hysterectomy  Family History: Reviewed history  from 01/13/2007 and no changes required. Family History of Asthma Family History of Colon CA 1st degree relative <60 Family History Lung cancer Family History of Respiratory disease  Social History: Reviewed history from 08/01/2009 and no changes required. 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 No apparent alopecia or balding. Eyes:  No corneal or conjunctival inflammation noted. EOMI. Perrla. Funduscopic exam benign, without hemorrhages, exudates or papilledema. Vision grossly normal. Ears:  External ear exam shows no significant lesions or deformities.  Otoscopic examination reveals clear canals, tympanic membranes are intact bilaterally without bulging, retraction, inflammation or discharge. Hearing is grossly normal bilaterally. Nose:  External nasal examination shows no deformity or inflammation. Nasal mucosa are pink and moist without lesions or exudates. Mouth:  Oral mucosa and oropharynx without lesions or exudates.  Teeth in good repair. Neck:  No deformities, masses, or tenderness noted. Chest Wall:  No deformities, masses, or tenderness noted. Breasts:  No mass, nodules, thickening, tenderness, bulging, retraction, inflamation, nipple discharge or skin changes noted.   Lungs:  Normal respiratory effort, chest expands symmetrically. Lungs are clear to auscultation, no crackles or wheezes. Heart:  Normal rate and regular rhythm. S1 and S2 normal without gallop, murmur, click, rub or other extra sounds. Abdomen:  Bowel sounds positive,abdomen soft and non-tender without masses, organomegaly or hernias noted. Msk:  No deformity  or scoliosis noted of thoracic or lumbar spine............Marland Kitchenexcept for swelling and tenderness of her left  knee   Pulses:  R and L carotid,radial,femoral,dorsalis pedis and posterior tibial pulses are full and equal bilaterally Extremities:  No clubbing, cyanosis, edema, or deformity noted with normal full range of motion of all joints.   Neurologic:  No cranial nerve deficits noted. Station and gait are normal. Plantar reflexes are down-going bilaterally. DTRs are symmetrical throughout. Sensory, motor and coordinative functions appear intact. Skin:  Intact without suspicious lesions or rashestattoo posterior upper left shoulder Cervical Nodes:  No lymphadenopathy noted Axillary Nodes:  No palpable lymphadenopathy Inguinal Nodes:  No significant adenopathy Psych:  Cognition and judgment appear intact. Alert and cooperative with normal attention span and concentration. No apparent delusions, illusions, hallucinations   Problems:  Medical Problems Added: 1)  Dx of Unspecified Internal Derangement of Knee  (ICD-717.9)  Impression & Recommendations:  Problem # 1:  UNSPECIFIED INTERNAL DERANGEMENT OF KNEE (ICD-717.9) Assessment New  Problem # 2:  ATTENTION DEFICIT DISORDER, ADULT (ICD-314.00) Assessment: Improved  Problem # 3:  ASTHMA (ICD-493.90) Assessment: Improved  Her updated medication list for this problem includes:    Singulair 10 Mg Tabs (Montelukast sodium) .Marland Kitchen... Take 1 tablet by mouth every morning    Proair Hfa 108 (90 Base) Mcg/act Aers (Albuterol sulfate) .Marland Kitchen..Marland Kitchen Two puffs as needed for asthma    Albuterol Sulfate (2.5 Mg/48ml) 0.083% Nebu (Albuterol sulfate) .Marland Kitchen... 1 via nebulizer up to 4 times daily as needed    Prednisone 20 Mg Tabs (Prednisone) ..... Uad    Qvar 80 Mcg/act Aers (Beclomethasone dipropionate) .Marland Kitchen... 2 ps two times a day  Complete Medication List: 1)  Hydrochlorothiazide 25 Mg Tabs (Hydrochlorothiazide) .... Take 1 tablet by mouth every morning 2)  Adderall 10 Mg Tabs (Amphetamine-dextroamphetamine) .... Take by mouth @ 1 pm 3)  Adderall Xr 20 Mg Cp24  (Amphetamine-dextroamphetamine) .... Take 2 tabs every am 4)  Adderall Xr 20 Mg Cp24 (Amphetamine-dextroamphetamine) .... Take 2 tabs every am  fill in one month 5)  Singulair 10 Mg Tabs (Montelukast sodium) .... Take 1 tablet by mouth every morning 6)  Proair Hfa 108 (90 Base) Mcg/act Aers (Albuterol sulfate) .... Two puffs as needed for asthma 7)  Adderall 10 Mg Tabs (Amphetamine-dextroamphetamine) .... Take one tab at 1 pm  fill in one month 8)  Adderall 10 Mg Tabs (Amphetamine-dextroamphetamine) .... Take one tab at 1 pm  fill in two months 9)  Adderall Xr 20 Mg Xr24h-cap (Amphetamine-dextroamphetamine) .... Take 2 tabs every morning  fill in two months 10)  Detrol La 4 Mg Xr24h-cap (Tolterodine tartrate) .... Take one tab once daily 11)  Albuterol Sulfate (2.5 Mg/53ml) 0.083% Nebu (Albuterol sulfate) .Marland Kitchen.. 1 via nebulizer up to 4 times daily as needed 12)  Prednisone 20 Mg Tabs (Prednisone) .... Uad 13)  Qvar 80 Mcg/act Aers (Beclomethasone dipropionate) .... 2 ps two times a day 14)  Concerta 27 Mg Cr-tabs (Methylphenidate hcl) .... Take one tab by mouth once daily 15)  Vyvanse 50 Mg Caps (Lisdexamfetamine dimesylate) .... Take one tab by mouth once daily  Patient Instructions: 1)  let's try the vynanse one daily.  Call in two weeks to give [place] some feedback on how is working for you Prescriptions: VYVANSE 50 MG CAPS (LISDEXAMFETAMINE DIMESYLATE) take one tab by mouth once daily  #30 x 0   Entered by:   Kern Reap CMA (AAMA)   Authorized by:   Roderick Pee MD  Signed by:   Kern Reap CMA (AAMA) on 02/01/2010   Method used:   Print then Give to Patient   RxID:   [id number]    Orders Added: 1)  Est. Patient Level III [13086]   Immunization History:  Influenza Immunization History:    Influenza:  historical (12/16/2009)   Immunization History:  Influenza Immunization History:    Influenza:  Historical (12/16/2009)

## 2010-04-17 NOTE — Assessment & Plan Note (Signed)
Summary: follow up/cjr   Vital Signs:  Patient profile:   50 year old female Menstrual status:  hysterectomy Weight:      193 pounds Temp:     98.6 degrees F oral BP sitting:   120 / 80  (left arm) Cuff size:   regular  Vitals Entered By: Kern Reap CMA Duncan Dull) (December 12, 2009 2:45 PM) CC: follow-up visit   CC:  follow-up visit.  History of Present Illness: Carrie [name] is a 50 year old, married female, nonsmoker, who comes in today for follow-up of abdominal pain.  We saw her last week for evaluation of abdominal pain.  Examination at the time was negative.  Her ultrasound showed no evidence of gallstones et Karie Soda.  She says her discomfort, bloating, is still the same however, she thinks is related to her weight.  She gained 20 pounds in the last couple months.  In the past when she gains weight.  She feels this way and then when she loses weight.  She feels better.  We discussed further diagnostic studies this time.  However, she likes to try the diet and exercise and weight loss.  First.  I also emphasized the fact she needs to see Dr. Rollene Fare  evaluation of her asthma.  Her  Allergies: 1)  Penicillin G Potassium (Penicillin G Potassium) 2)  Aspirin (Aspirin) 3)  Codeine Phosphate (Codeine Phosphate)  Past History:  Past medical, surgical, family and social histories (including risk factors) reviewed, and no changes noted (except as noted below).  Past Medical History: Reviewed history from 01/13/2007 and no changes required. ADD Fibromyalgia MHA Obese Anemia-iron deficiency Asthma  Past Surgical History: Reviewed history from 11/24/2009 and no changes required. CB x2 Colonoscopy Hysterectomy  Family History: Reviewed history from 01/13/2007 and no changes required. Family History of Asthma Family History of Colon CA 1st degree relative <60 Family History Lung cancer Family History of Respiratory disease  Social History: Reviewed history from 08/01/2009 and no  changes required. 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 Take 1 tablet by mouth every morning 2)  Adderall 10 Mg Tabs (Amphetamine-dextroamphetamine) .... Take by mouth @ 1 pm 3)  Adderall Xr 20 Mg Cp24 (Amphetamine-dextroamphetamine) .... Take 2 tabs every am 4)  Adderall Xr 20 Mg Cp24 (Amphetamine-dextroamphetamine) .... Take 2 tabs every am  fill in one month 5)  Singulair 10 Mg Tabs (Montelukast sodium) .... Take 1 tablet by mouth every morning 6)  Proair Hfa 108 (90 Base) Mcg/act Aers (Albuterol sulfate) .... Two puffs as needed for asthma 7)  Adderall 10 Mg Tabs (Amphetamine-dextroamphetamine) .... Take one tab at 1 pm  fill in one month 8)  Adderall 10 Mg Tabs (Amphetamine-dextroamphetamine) .... Take one tab at 1 pm  fill in two months 9)  Adderall Xr 20 Mg Xr24h-cap (Amphetamine-dextroamphetamine) .... Take 2 tabs every morning  fill in two months 10)  Detrol La 4 Mg Xr24h-cap (Tolterodine tartrate) .... Take one tab once daily 11)  Albuterol Sulfate (2.5 Mg/54ml) 0.083% Nebu (Albuterol sulfate) .Marland Kitchen.. 1 via nebulizer up to 4 times daily as needed 12)  Prednisone 20 Mg Tabs (Prednisone) .... Uad 13)  Qvar 80 Mcg/act Aers (Beclomethasone dipropionate) .Marland KitchenMarland KitchenMarland Kitchen  2 ps two times a day  Patient Instructions: 1)  called Dr. Reather Converse to make an appointment to be seen. 2)  Let's taper off the prednisone ASAP Prescriptions: SINGULAIR 10 MG TABS (MONTELUKAST SODIUM) Take 1 tablet by mouth every morning  #100 x 3   Entered and Authorized by:   Roderick Pee MD   Signed by:   Roderick Pee MD on 12/12/2009    Method used:   Print then Give to Patient   RxID:   [id number]

## 2010-04-17 NOTE — Letter (Signed)
Summary: Medical Clearance for Surgery  Medical Clearance for Surgery   Imported By: Maryln Gottron 02/07/2010 09:57:42  _____________________________________________________________________  External Attachment:    Type:   Image     Comment:   External Document

## 2010-04-17 NOTE — Assessment & Plan Note (Signed)
Summary: 4 day rov/njr   Vital Signs:  Patient profile:   50 year old female Menstrual status:  hysterectomy Temp:     98.4 degrees F oral BP sitting:   124 / 88  (left arm) Cuff size:   regular  Vitals Entered By: Kern Reap CMA Duncan Dull) (Aug 01, 2009 4:42 PM) CC: follow-up visit   CC:  follow-up visit.  History of Present Illness: Carrie [name] is a 50 year old, married female, nonsmoker, who comes in today for reevaluation of asthma.  She is currently on 40 mg of prednisone and daily Qvar 82 puffs b.i.d. program two puffs b.i.d., and she has the albuterol nebulizer to use q.i.d., p.r.n.  She states she feels much much better.  She can sleep at night.  As noted in a previous note.  She said multiple, multiple admissions for asthma when she was a teenager.  She's been on a ventilator.  Many times.  The question at this point in time.  This is just a flare when she has had a problem in many years or as is the beginning of her asthma acting up.  We discussed options.  We will taper the prednisone as quickly as possible and I will ask her to call Reather Converse for consult.  Allergies: 1)  Penicillin G Potassium (Penicillin G Potassium) 2)  Aspirin (Aspirin) 3)  Codeine Phosphate (Codeine Phosphate)  Social History: Reviewed history from 01/13/2007 and no changes required. 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 Take 1 tablet by mouth every morning    Proair Hfa 108 (90 Base) Mcg/act Aers (Albuterol sulfate) .Marland Kitchen..Marland Kitchen Two puffs as needed for  asthma    Qvar 80 Mcg/act Aers (Beclomethasone dipropionate) .Marland Kitchen... 2 ps two times a day    Prednisone 20 Mg Tabs (Prednisone) ..... Uad    Albuterol Sulfate (2.5 Mg/27ml) 0.083% Nebu (Albuterol sulfate) .Marland Kitchen... 1 via nebulizer up to 4 times daily as needed  Complete Medication List: 1)  Hydrochlorothiazide 25 Mg Tabs (Hydrochlorothiazide) .... Take 1 tablet by mouth every morning 2)  Adderall 10 Mg Tabs (Amphetamine-dextroamphetamine) .... Take by mouth @ 1 pm 3)  Adderall Xr 20 Mg Cp24 (Amphetamine-dextroamphetamine) .... Take 2 tabs every am 4)  Adderall Xr 20 Mg Cp24 (Amphetamine-dextroamphetamine) .... Take 2 tabs every am  fill in one month 5)  Singulair 10 Mg Tabs (Montelukast sodium) .... Take 1 tablet by mouth every morning 6)  Proair Hfa 108 (90 Base) Mcg/act Aers (Albuterol sulfate) .... Two puffs as needed for asthma 7)  Adderall 10 Mg Tabs (Amphetamine-dextroamphetamine) .... Take one tab at 1 pm  fill in one month 8)  Adderall 10 Mg Tabs (Amphetamine-dextroamphetamine) .... Take one tab at 1 pm  fill in two months 9)  Adderall Xr 20 Mg Xr24h-cap (Amphetamine-dextroamphetamine) .... Take 2 tabs every morning  fill in two months 10)  Qvar 80 Mcg/act Aers (Beclomethasone dipropionate) .... 2 ps two times a day 11)  Detrol La 4 Mg Xr24h-cap (Tolterodine tartrate) .... Take one tab once daily 12)  Prednisone 20 Mg Tabs (Prednisone) .... Uad 13)  Albuterol Sulfate (  2.5 Mg/63ml) 0.083% Nebu (Albuterol sulfate) .Marland Kitchen.. 1 via nebulizer up to 4 times daily as needed  Other Orders: Nutrition Referral (Nutrition)  Patient Instructions: 1)  stop the nebulizer, and the pro air.  Continue the Qvar two puffs twice daily. 2)  Taper the prednisone as we discussed. 3)  Call Dr. Reather Converse, allergist immunologist for consult

## 2010-04-17 NOTE — Miscellaneous (Signed)
Summary: mammogram  Clinical Lists Changes  Observations: Added new observation of MAMMOGRAM: normal (06/15/2002 11:23)      Preventive Care Screening  Mammogram:    Date:  06/15/2002    Results:  normal

## 2010-04-17 NOTE — Medication Information (Signed)
Summary: PRIOR AUTHORIZATION REQUEST  PRIOR AUTHORIZATION REQUEST Provider: This provider was preselected by the workflow.  Signature: The signature status of this document was preset by the workflow  Processed by [employer] Local Indexer Client @ Thursday, March 09, 2009 11:30:19 AM using version:2010.1.2.11(2.4)   Manually Indexed By: [id number]  idlBatchDetail: [id number]   _____________________________________________________________________  External Attachment:    Type:   Image     Comment:   External Document

## 2010-04-17 NOTE — Assessment & Plan Note (Signed)
Summary: fu on meds/njr   Vital Signs:  Patient Profile:   50 Years Old Female Weight:      180 pounds Temp:     98.5 degrees F oral Pulse rate:   64 / minute Pulse rhythm:   regular BP sitting:   124 / 84  (left arm) Cuff size:   regular  Vitals Entered By: Kern Reap CMA (November 30, 2007 4:26 PM)                 Chief Complaint:  follow up meds.  History of Present Illness: Carrie [name] is a 50 year old female, who comes in today for yearly evaluation because of underlying allergic rhinitis, asthma, and a history of ADD.  Allergic rhinitis is quiet.  She is currently not taking her Singulair 10 mg daily for chronic asthma.  She takes albuterol p.r.n.  Uses less than two canisters a year.  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  She takes Adderall 20 mg long acting two tablets daily, along with a regular Adderall 10 mg tablet around 1 p.m. this controls her ADD.  She is up on all her health maintenance.  Activities.  Last tetanus shot 2003.  Last Pneumovax 08.  Last colonoscopy by Dr. Dickie La in 04 they will give her a recall card    Current Allergies (reviewed today): PENICILLIN G POTASSIUM (PENICILLIN G POTASSIUM) ASPIRIN (ASPIRIN) CODEINE PHOSPHATE (CODEINE PHOSPHATE)  Past Medical History:    Reviewed history from 01/13/2007 and no changes required:       ADD       Fibromyalgia       MHA       Obese       Anemia-iron deficiency       Asthma   Social History:    Reviewed history from 01/13/2007 and no changes required:       Occupation:       Married       Never Smoked       Alcohol use-yes    Review of Systems      See HPI   Physical Exam  General:     Well-developed,well-nourished,in no acute distress; alert,appropriate and cooperative throughout examination Head:     Normocephalic and atraumatic without obvious abnormalities. No apparent alopecia or balding. Eyes:     No corneal or conjunctival inflammation noted. EOMI. Perrla. Funduscopic exam benign,  without hemorrhages, exudates or papilledema. Vision grossly normal. Ears:     External ear exam shows no significant lesions or deformities.  Otoscopic examination reveals clear canals, tympanic membranes are intact bilaterally without bulging, retraction, inflammation or discharge. Hearing is grossly normal bilaterally. Nose:     External nasal examination shows no deformity or inflammation. Nasal mucosa are pink and moist without lesions or exudates. Mouth:     Oral mucosa and oropharynx without lesions or exudates.  Teeth in good repair. Neck:     No deformities, masses, or tenderness noted. Chest Wall:     No deformities, masses, or tenderness noted. Lungs:     Normal respiratory effort, chest expands symmetrically. Lungs are clear to auscultation, no crackles or wheezes. Heart:     Normal rate and regular rhythm. S1 and S2 normal without gallop, murmur, click, rub or other extra sounds. Psych:     Cognition and judgment appear intact. Alert and cooperative with normal attention span and concentration. No apparent delusions, illusions, hallucinations    Impression & Recommendations:  Problem # 1:  ASTHMA (ICD-493.90) Assessment: Improved  The following medications were removed from the medication list:    Singulair 10 Mg Tabs (Montelukast sodium) .Marland Kitchen... Take 1 tablet by mouth once a day  Her updated medication list for this problem includes:    Albuterol 90 Mcg/act Aers (Albuterol)    Singulair 10 Mg Tabs (Montelukast sodium) .Marland Kitchen... Take 1 tablet by mouth every morning   Problem # 2:  ATTENTION DEFICIT DISORDER, ADULT (ICD-314.00) Assessment: Improved  Complete Medication List: 1)  Albuterol 90 Mcg/act Aers (Albuterol) 2)  Detrol La 2 Mg Cp24 (Tolterodine tartrate) .... Take 1 capsule by mouth once a day 3)  Hydrochlorothiazide 25 Mg Tabs (Hydrochlorothiazide) .... Take 1 tablet by mouth every morning 4)  Adderall 10 Mg Tabs (Amphetamine-dextroamphetamine) .... Take by  mouth @ 1 pm 5)  Adderall Xr 20 Mg Cp24 (Amphetamine-dextroamphetamine) .... Take 2 tabs every am 6)  Adderall Xr 20 Mg Cp24 (Amphetamine-dextroamphetamine) .... Take 2 tabs every am 7)  Singulair 10 Mg Tabs (Montelukast sodium) .... Take 1 tablet by mouth every morning   Patient Instructions: 1)  Please schedule a follow-up appointment in 1 year.   Prescriptions: ADDERALL XR 20 MG  CP24 (AMPHETAMINE-DEXTROAMPHETAMINE) Take 2 tabs every AM  #60 x 0   Entered and Authorized by:   Roderick Pee MD   Signed by:   Roderick Pee MD on 11/30/2007   Method used:   Print then Give to Patient   RxID:   [id number] ADDERALL 10 MG  TABS (AMPHETAMINE-DEXTROAMPHETAMINE) TAKE by mouth @ 1 PM  #30 x 0   Entered and Authorized by:   Roderick Pee MD   Signed by:   Roderick Pee MD on 11/30/2007   Method used:   Print then Give to Patient   RxID:   [id number] ADDERALL XR 20 MG  CP24 (AMPHETAMINE-DEXTROAMPHETAMINE) Take 2 tabs every AM  #60 x 0   Entered and Authorized by:   Roderick Pee MD   Signed by:   Roderick Pee MD on 11/30/2007   Method used:   Print then Give to Patient   RxID:   [id number] ADDERALL 10 MG  TABS (AMPHETAMINE-DEXTROAMPHETAMINE) TAKE by mouth @ 1 PM  #30 x 0   Entered and Authorized by:   Roderick Pee MD   Signed by:   Roderick Pee MD on 11/30/2007   Method used:   Print then Give to Patient   RxID:   [id number] ADDERALL XR 20 MG  CP24 (AMPHETAMINE-DEXTROAMPHETAMINE) Take 2 tabs every AM  #60 x 0   Entered and Authorized by:   Roderick Pee MD   Signed by:   Roderick Pee MD on 11/30/2007   Method used:   Print then Give to Patient   RxID:   [id number] ADDERALL 10 MG  TABS (AMPHETAMINE-DEXTROAMPHETAMINE) TAKE by mouth @ 1 PM  #30 x 0   Entered and Authorized by:   Roderick Pee MD   Signed by:   Roderick Pee MD on 11/30/2007   Method used:   Print then Give to Patient   RxID:   [id number] HYDROCHLOROTHIAZIDE 25 MG TABS  (HYDROCHLOROTHIAZIDE) Take 1 tablet by mouth every morning  #100 x 3   Entered and Authorized by:   Roderick Pee MD   Signed by:   Roderick Pee MD on 11/30/2007   Method used:   Print then Give to Patient   RxID:   [id number] DETROL LA 2 MG CP24 (TOLTERODINE  TARTRATE) Take 1 capsule by mouth once a day  #100 x 3   Entered and Authorized by:   Roderick Pee MD   Signed by:   Roderick Pee MD on 11/30/2007   Method used:   Print then Give to Patient   RxID:   [id number] ALBUTEROL 90 MCG/ACT AERS (ALBUTEROL)   #2 x 1   Entered and Authorized by:   Roderick Pee MD   Signed by:   Roderick Pee MD on 11/30/2007   Method used:   Print then Give to Patient   RxID:   [id number] SINGULAIR 10 MG TABS (MONTELUKAST SODIUM) Take 1 tablet by mouth every morning  #100 x 3   Entered and Authorized by:   Roderick Pee MD   Signed by:   Roderick Pee MD on 11/30/2007   Method used:   Print then Give to Patient   RxID:   [id number]  ]
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
## 2010-04-17 NOTE — Progress Notes (Signed)
  Phone Note Outgoing Call   Call placed by: jat Call placed to: Patient Summary of Call: I called and left a message with Carrie [name], recommending she see Dr. Reather Converse, allergy immunology for evaluation because of her severe asthma Initial call taken by: Roderick Pee MD,  December 07, 2007 8:28 AM

## 2010-04-17 NOTE — Miscellaneous (Signed)
Summary: BONE DENSITY  Clinical Lists Changes  Orders: Added new Test order of T-Bone Densitometry (77080) - Signed Added new Test order of T-Lumbar Vertebral Assessment (77082) - Signed 
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
## 2010-04-17 NOTE — Progress Notes (Signed)
Summary: RF  Medications Added ALBUTEROL 90 MCG/ACT AERS (ALBUTEROL)  DETROL LA 2 MG CP24 (TOLTERODINE TARTRATE) Take 1 capsule by mouth once a day HYDROCHLOROTHIAZIDE 25 MG TABS (HYDROCHLOROTHIAZIDE) Take 1 tablet by mouth every morning MEPERIDINE HCL 50 MG TABS (MEPERIDINE HCL) Take 1 tablet by mouth three times a day SINGULAIR 10 MG TABS (MONTELUKAST SODIUM) Take 1 tablet by mouth once a day ZOMIG ZMT 2.5 MG TBDP (ZOLMITRIPTAN) Take 1 tablet by mouth ADDERALL 20 MG  TABS (AMPHETAMINE-DEXTROAMPHETAMINE) two times a day ADDERALL 10 MG  TABS (AMPHETAMINE-DEXTROAMPHETAMINE) TAKE by mouth @ 1 PM      Allergies Added: PENICILLIN G POTASSIUM (PENICILLIN G POTASSIUM) ASPIRIN (ASPIRIN) CODEINE PHOSPHATE (CODEINE PHOSPHATE) Phone Note Call from Patient Call back at Work Phone [phone]   Caller: Patient Call For: DR TODD Reason for Call: Refill Medication Summary of Call: RF ADDERALL 20MG  two times a day RF ADDERALL 10MG  1PM Initial call taken by: Warnell Forester,  December 15, 2006 2:07 PM  Follow-up for Phone Call        Phone Call Completed, Rx Called In, Provider Notified  PT SAYS THAT SHE TAKES 40MG (2 20MG  TABS) IN THE AM AND 10MG  @ 1PM Follow-up by: Arcola Jansky, RN,  December 16, 2006 12:07 PM   New Allergies: PENICILLIN G POTASSIUM (PENICILLIN G POTASSIUM) ASPIRIN (ASPIRIN) CODEINE PHOSPHATE (CODEINE PHOSPHATE) New/Updated Medications: ALBUTEROL 90 MCG/ACT AERS (ALBUTEROL)  DETROL LA 2 MG CP24 (TOLTERODINE TARTRATE) Take 1 capsule by mouth once a day HYDROCHLOROTHIAZIDE 25 MG TABS (HYDROCHLOROTHIAZIDE) Take 1 tablet by mouth every morning MEPERIDINE HCL 50 MG TABS (MEPERIDINE HCL) Take 1 tablet by mouth three times a day SINGULAIR 10 MG TABS (MONTELUKAST SODIUM) Take 1 tablet by mouth once a day ZOMIG ZMT 2.5 MG TBDP (ZOLMITRIPTAN) Take 1 tablet by mouth ADDERALL 20 MG  TABS (AMPHETAMINE-DEXTROAMPHETAMINE) two times a day ADDERALL 10 MG  TABS  (AMPHETAMINE-DEXTROAMPHETAMINE) TAKE by mouth @ 1 PM New Allergies: PENICILLIN G POTASSIUM (PENICILLIN G POTASSIUM) ASPIRIN (ASPIRIN) CODEINE PHOSPHATE (CODEINE PHOSPHATE)  Prescriptions: ADDERALL 10 MG  TABS (AMPHETAMINE-DEXTROAMPHETAMINE) TAKE by mouth @ 1 PM  #30 x 0   Entered by:   Arcola Jansky, RN   Authorized by:   Roderick Pee MD   Signed by:   Arcola Jansky, RN on 12/16/2006   Method used:   Print then Give to Patient   RxID:   [id number] ADDERALL 10 MG  TABS (AMPHETAMINE-DEXTROAMPHETAMINE) TAKE by mouth @ 1 PM  #30 x 0   Entered by:   Arcola Jansky, RN   Authorized by:   Roderick Pee MD   Signed by:   Arcola Jansky, RN on 12/16/2006   Method used:   Print then Give to Patient   RxID:   [id number] ADDERALL 10 MG  TABS (AMPHETAMINE-DEXTROAMPHETAMINE) TAKE by mouth @ 1 PM  #30 x 0   Entered by:   Arcola Jansky, RN   Authorized by:   Roderick Pee MD   Signed by:   Arcola Jansky, RN on 12/16/2006   Method used:   Print then Give to Patient   RxID:   [id number] ADDERALL 20 MG  TABS (AMPHETAMINE-DEXTROAMPHETAMINE) two times a day  #60 x 0   Entered by:   Arcola Jansky, RN   Authorized by:   Roderick Pee MD   Signed by:   Arcola Jansky, RN on 12/15/2006   Method used:   Print then [employer]  to Patient   RxID:   [id number] ADDERALL 20 MG  TABS (AMPHETAMINE-DEXTROAMPHETAMINE) two times a day  #60 x 0   Entered by:   Arcola Jansky, RN   Authorized by:   Roderick Pee MD   Signed by:   Arcola Jansky, RN on 12/15/2006   Method used:   Print then Give to Patient   RxID:   [id number] ADDERALL 20 MG  TABS (AMPHETAMINE-DEXTROAMPHETAMINE) two times a day  #60 x 0   Entered by:   Arcola Jansky, RN   Authorized by:   Roderick Pee MD   Signed by:   Arcola Jansky, RN on 12/15/2006   Method used:   Print then Give to Patient   RxID:    [id number]

## 2010-04-17 NOTE — Miscellaneous (Signed)
Summary: [facility]  Clinical Lists Changes  Medications: Added new medication of MIRALAX   POWD (POLYETHYLENE GLYCOL 3350) As per prep  instructions. - Signed Added new medication of REGLAN 10 MG  TABS (METOCLOPRAMIDE HCL) As per prep instructions. - Signed Added new medication of DULCOLAX 5 MG  TBEC (BISACODYL) Day before procedure take 2 at 3pm and 2 at 8pm. - Signed Rx of MIRALAX   POWD (POLYETHYLENE GLYCOL 3350) As per prep  instructions.;  #255gm x 0;  Signed;  Entered by: Ezra Sites RN;  Authorized by: Hart Carwin;  Method used: Electronically to [facility] [address] [place], [place]  [address], Ph: [phone] or [phone], Fax: [phone] Rx of REGLAN 10 MG  TABS (METOCLOPRAMIDE HCL) As per prep instructions.;  #2 x 0;  Signed;  Entered by: Ezra Sites RN;  Authorized by: Hart Carwin;  Method used: Electronically to [facility] [address], [place], [place]  [address], Ph: [phone] or [phone], Fax: [phone] Rx of DULCOLAX 5 MG  TBEC (BISACODYL) Day before procedure take 2 at 3pm and 2 at 8pm.;  #4 x 0;  Signed;  Entered by: Ezra Sites RN;  Authorized by: Hart Carwin;  Method used: Electronically to [facility] [address] [place], [place]  [address], Ph: [phone] or [phone], Fax: [phone] Allergies: Changed allergy or adverse reaction from PENICILLIN G POTASSIUM (PENICILLIN G POTASSIUM) to PENICILLIN G POTASSIUM (PENICILLIN G POTASSIUM) Changed allergy or adverse reaction from ASPIRIN (ASPIRIN) to ASPIRIN (ASPIRIN) Changed allergy or adverse reaction from CODEINE PHOSPHATE (CODEINE PHOSPHATE) to CODEINE PHOSPHATE (CODEINE PHOSPHATE)    Prescriptions: DULCOLAX 5 MG  TBEC (BISACODYL) Day before procedure take 2 at 3pm and 2 at 8pm.  #4 x 0   Entered by:   Ezra Sites RN   Authorized by:   Hart Carwin   Signed by:   Ezra Sites RN on 09/20/2008   Method used:   Electronically to        [facility] (retail)       [address]       [place], [place]  [address]       Ph: [phone] or [phone]       Fax: [phone]   RxID:   [id number] REGLAN 10 MG  TABS (METOCLOPRAMIDE HCL) As per prep instructions.  #2 x 0   Entered by:   Ezra Sites RN   Authorized by:   Hart Carwin   Signed by:   Ezra Sites RN on 09/20/2008   Method used:   Electronically to        [facility] (retail)       [address]       [place], [place]  [address]       Ph: [phone] or [phone]       Fax: [phone]   RxID:   [id number] MIRALAX   POWD (POLYETHYLENE GLYCOL 3350) As per prep  instructions.  #255gm x 0   Entered by:   Ezra Sites RN   Authorized by:   Hart Carwin   Signed by:   Ezra Sites RN on 09/20/2008   Method used:   Electronically to        [facility] (retail)       [address]       [place], [place]  [address]       Ph: [phone] or [phone]  Fax: [phone]   RxID:   [id number]

## 2010-04-17 NOTE — Letter (Signed)
Summary: medical information  medical information   Imported By: Kassie Mends 12/08/2007 10:41:36  _____________________________________________________________________  External Attachment:    Type:   Image     Comment:   medical information

## 2010-04-17 NOTE — Miscellaneous (Signed)
Summary: colonoscopy  Clinical Lists Changes  Observations: Added new observation of COLONOSCOPY: normal (07/01/2002 11:22)      Preventive Care Screening  Colonoscopy:    Date:  07/01/2002    Results:  normal

## 2010-04-17 NOTE — Progress Notes (Signed)
Summary: Adderall XR  (2 rx) picked up.  Phone Note Call from Patient Call back at [phone]   Caller: Patient Call For: todd Summary of Call: Dr Tawanna Cooler wrote rx for  adderall 20mg   tablets take 2 times a day  this should have been XR has 2 rx that have not yet been filled  needs two correct rx  Initial call taken by: Roselle Locus,  January 19, 2007 9:32 AM  Follow-up for Phone Call        call patient apologized .  I signed the prescriptions as they were written for me.  Tell her to drain the prescriptions and tomorrow Thursday or Friday and I will rewrite them in her presence.  Daughter bring back your prescription, so we can dispose of them. Follow-up by: Roderick Pee MD,  January 19, 2007 6:03 PM  Additional Follow-up for Phone Call Additional follow up Details #1::        Left a detailed message on pt personal phone to return to our office, bring back incorrect Adderall prescriptions and Dr Tawanna Cooler will write new RX for her when she returns. Additional Follow-up by: Sid Falcon LPN,  January 21, 2007 9:13 AM    Additional Follow-up for Phone Call Additional follow up Details #2::    Pt called back and asked if her husband Carrie [name] could pick up new meds as he works nearby and she works in [employer].  Two new RX written for Adderall XR 20 mg, 2 every AM, #60 RF 0.  Pt husband has the 2 Adderall incorrect prescriptions for [place] to obtain and discard.   ..................................................................Marland KitchenSid Falcon LPN  January 21, 2007 9:36 AM  Earlyne Iba stopped by office with 2 plain Adderall RX, these were shreded.  New RX Adderall XR picked up, one can be filled right away, 2nd on can be filled in one month.  Follow-up by: Sid Falcon LPN,  January 21, 2007 10:01 AM

## 2010-04-17 NOTE — Progress Notes (Signed)
Summary: status of meds to [facility] Note Call from Patient Call back at [phone] cell   Caller: Patient Call For: Roderick Pee MD Summary of Call: Pt called and said the the scripts you wrote during her cpx were suppose to be sent electronically to [facility]. Pt said she hasnt recvd any of her meds.  Initial call taken by: Lucy Antigua,  October 20, 2008 4:39 PM  Follow-up for Phone Call        patient is aware that rx were sent to [facility] Follow-up by: Kern Reap CMA Duncan Dull),  October 21, 2008 8:46 AM    Prescriptions: PROAIR HFA 108 (90 BASE) MCG/ACT AERS (ALBUTEROL SULFATE) two puffs as needed for asthma  #1 x 2   Entered by:   Kern Reap CMA (AAMA)   Authorized by:   Roderick Pee MD   Signed by:   Kern Reap CMA (AAMA) on 10/20/2008   Method used:   Electronically to        [facility]* (mail-order)             ,          Ph: [phone]       Fax: [phone]   RxID:   [id number] SINGULAIR 10 MG TABS (MONTELUKAST SODIUM) Take 1 tablet by mouth every morning  #90 x 3   Entered by:   Kern Reap CMA (AAMA)   Authorized by:   Roderick Pee MD   Signed by:   Kern Reap CMA (AAMA) on 10/20/2008   Method used:   Electronically to        [facility]* (mail-order)             ,          Ph: [phone]       Fax: [phone]   RxID:   [id number] HYDROCHLOROTHIAZIDE 25 MG TABS (HYDROCHLOROTHIAZIDE) Take 1 tablet by mouth every morning  #90 x 3   Entered by:   Kern Reap CMA (AAMA)   Authorized by:   Roderick Pee MD   Signed by:   Kern Reap CMA (AAMA) on 10/20/2008   Method used:   Electronically to        [facility]* (mail-order)             ,          Ph: [phone]       Fax: [phone]   RxID:   [id number] DETROL LA 2 MG CP24 (TOLTERODINE TARTRATE) Take 1 capsule by mouth once a day  #90 x 3   Entered by:   Kern Reap CMA (AAMA)   Authorized by:   Roderick Pee MD   Signed by:   Kern Reap CMA (AAMA) on 10/20/2008   Method used:   Electronically to        [facility]* (mail-order)             ,          Ph: [phone]       Fax: [phone]   RxID:   [id number]

## 2010-04-17 NOTE — Progress Notes (Signed)
Summary: [employer] Forms  Phone Note Call from Patient   Caller: Patient [phone] Reason for Call: Talk to Nurse Summary of Call: Pt called to adv that employer is stating that someone from [employer] will need to call their Short-term Disability Rep... ([employer]) at [phone].... Pt was adv that paperwork would need to be sent to [employer] to be completed and pt adv that she was told someone would have to call [employer] Rep to give info concerning claim.... Pt can be reached at [phone] with any questions or concerns.  Initial call taken by: Debbra Riding,  April 26, 2009 4:07 PM  Follow-up for Phone Call        called pt and told her that they needed to the forms over and I would fill out and fax back.  pt was concerned that they were [employer] was waiting on a call.  I called [employer] and asked them to fax forms and gave them our fax number. Follow-up by: Alfred Levins, CMA,  April 26, 2009 4:26 PM  Additional Follow-up for Phone Call Additional follow up Details #1::        Forms received via fax, given to [facility], CMA. Additional Follow-up by: Debbra Riding,  April 26, 2009 4:52 PM

## 2010-04-17 NOTE — Medication Information (Signed)
Summary: Coverage Approval for Prescription Drug  Coverage Approval for Prescription Drug Keena Dinse: This Chimamanda Siegfried was preselected by the workflow.  Signature: The signature status of this document was preset by the workflow  Processed by [employer] Local Indexer Client @ Thursday, March 09, 2009 10:19:05 AM using version:2010.1.2.11(2.4)   Manually Indexed By: [id number]  idlBatchDetail: [id number]   _____________________________________________________________________  External Attachment:    Type:   Image     Comment:   External Document

## 2010-04-17 NOTE — Progress Notes (Signed)
Summary: RESCUE INHALER  Phone Note Call from Patient Call back at [phone]   Caller: PT LIVE Call For: TODD Summary of Call: HER ASHTMA IS UNDER CONTROL.  SHE IS GOING TO SOFTBALL GAMES TO WATCH HER DAUGHTER PLAY.  SHE NOTICES THAT SITTING IN THE POLLEN AIR AT THE BALL FIELD FOR SO LONG SHE FEELS A LITTLE SHORT OF BREATH.  COULD YOU CALL IN A RESCUE INHALER FOR HER JUST IN CASE.  [facility]  Initial call taken by: Roselle Locus,  Jul 19, 2008 12:04 PM  Follow-up for Phone Call        rx sent Follow-up by: Kern Reap CMA,  Jul 19, 2008 12:42 PM

## 2010-04-17 NOTE — Miscellaneous (Signed)
Summary: Progress Note/[facility]  Progress Note/[facility]   Imported By: Maryln Gottron 06/13/2009 13:06:07  _____________________________________________________________________  External Attachment:    Type:   Image     Comment:   External Document

## 2010-04-17 NOTE — Procedures (Signed)
Summary: Colonoscopy   Colonoscopy  Procedure date:  10/11/2008  Findings:      Location:  [facility].    Procedures Next Due Date:    Colonoscopy: 10/2013 COLONOSCOPY PROCEDURE REPORT  PATIENT:  Carrie [name], Carrie [name]  MR#:  [id number] BIRTHDATE:   1960/12/29, 48 yrs. old   GENDER:   female  ENDOSCOPIST:   Hedwig Morton. Juanda Chance, MD Referred by:   PROCEDURE DATE:  10/11/2008 PROCEDURE:  Higher-risk screening colonoscopy  colonoscopy via colostomy ASA CLASS:   Class I INDICATIONS: family history of colon cancer in pt's mother  last colonoscopy 2005 was normal  MEDICATIONS:    Versed 10 mg, Fentanyl 100 mcg  DESCRIPTION OF PROCEDURE:   After the risks benefits and alternatives of the procedure were thoroughly explained, informed consent was obtained.  Digital rectal exam was performed and revealed no rectal masses.   The LB [id number] endoscope was introduced through the anus and advanced to the cecum, which was identified by the ileocecal valve, unable to see the appendiceal opening  The quality of the prep was good, using MiraLax.  The instrument was then slowly withdrawn as the colon was fully examined. <<PROCEDUREIMAGES>>      <<OLD IMAGES>>  FINDINGS:  No polyps or cancers were seen (see image1, image2, and image3).   Retroflexed views in the rectum revealed no abnormalities.    The scope was then withdrawn from the patient and the procedure completed.  COMPLICATIONS:   None  ENDOSCOPIC IMPRESSION:  1) No polyps or cancers  2) Normal colonoscopy RECOMMENDATIONS:  1) high fiber diet  REPEAT EXAM:   In 5 year(s) for.   _______________________________ Hedwig Morton. Juanda Chance, MD  CC: Roderick Pee, MD

## 2010-04-17 NOTE — Assessment & Plan Note (Signed)
Summary: fu on asthma /njr   Vital Signs:  Patient profile:   50 year old female Menstrual status:  hysterectomy Weight:      193 pounds BMI:     32.73 Temp:     98.6 degrees F oral BP sitting:   120 / 80  (left arm) Cuff size:   regular  Vitals Entered By: Kern Reap CMA Duncan Dull) (November 24, 2009 5:04 PM) CC: cough, asthma   CC:  cough and asthma.  History of Present Illness: Carrie [name] is a 50 year old female, who comes in today for a flareup of her asthma and abdominal discomfort unrelated to the asthma.  She has a history of severe asthma and has had multiple hospitalizations as a teenager, young, adult.  We had set her up to see Dr. Reather Converse, however, they never got together.  I encouraged him to go back and see Dr. Willa Rough.  For the last couple, weeks.  She's had a flareup of her asthma and has been using Gerlach, but it hasn't helped.  She stopped her Singulair, and she stopped her inhaled steroid.  The last couple, weeks.  She's also had some fullness and points to the midepigastric area.  Some of her symptoms occur at night and make her think she may have a hiatal hernia.  Some of her symptoms are related to meals.  Three of her family members have had gallbladder disease.  She does not have the classic right upper quadrant pain p.c.  No urinary or bowel problems.  Uterus removed for nonmalignant reasons  Allergies: 1)  Penicillin G Potassium (Penicillin G Potassium) 2)  Aspirin (Aspirin) 3)  Codeine Phosphate (Codeine Phosphate)  Past History:  Past medical, surgical, family and social histories (including risk factors) reviewed for relevance to current acute and chronic problems.  Past Medical History: Reviewed history from 01/13/2007 and no changes required. ADD Fibromyalgia MHA Obese Anemia-iron deficiency Asthma  Past Surgical History: CB x2 Colonoscopy Hysterectomy  Family History: Reviewed history from 01/13/2007 and no changes required. Family  History of Asthma Family History of Colon CA 1st degree relative <60 Family History Lung cancer Family History of Respiratory disease  Social History: Reviewed history from 08/01/2009 and no changes required. 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 No apparent alopecia or balding. Eyes:  No corneal or conjunctival inflammation noted. EOMI. Perrla. Funduscopic exam benign, without hemorrhages, exudates or papilledema. Vision grossly normal. Ears:  External ear exam shows no significant lesions or deformities.  Otoscopic examination reveals clear canals, tympanic membranes are intact bilaterally without bulging, retraction, inflammation or discharge. Hearing is grossly normal bilaterally. Nose:  External nasal examination shows no deformity or inflammation. Nasal mucosa are pink and moist without lesions or exudates. Mouth:  Oral mucosa and oropharynx without lesions or exudates.  Teeth in good repair. Neck:  No deformities, masses, or tenderness noted. Chest Wall:  No deformities, masses, or tenderness noted. Lungs:  symmetrical breath sounds delayed expiratory 1+ wheezing Abdomen:  Bowel sounds positive,abdomen soft and non-tender without masses, organomegaly or hernias noted.   Problems:  Medical Problems Added: 1)  Dx of Abdominal Pain, Epigastric  (ICD-789.06)  Impression & Recommendations:  Problem # 1:  ABDOMINAL PAIN, EPIGASTRIC (ICD-789.06) Assessment New  Orders: Radiology Referral (Radiology) Prescription Created Electronically [phone])  Problem # 2:  ASTHMA (ICD-493.90) Assessment: Comment Only  The following medications were removed from the medication list:    Qvar 80 Mcg/act Aers (Beclomethasone dipropionate)  .Marland Kitchen... 2 ps two times a day    Prednisone 20 Mg Tabs (Prednisone) ..... Uad Her updated medication list for this problem includes:    Singulair 10 Mg Tabs (Montelukast sodium) .Marland Kitchen... Take 1 tablet by mouth every morning    Proair Hfa 108 (90 Base) Mcg/act Aers (Albuterol sulfate) .Marland Kitchen..Marland Kitchen Two puffs as needed for asthma    Albuterol Sulfate (2.5 Mg/89ml) 0.083% Nebu (Albuterol sulfate) .Marland Kitchen... 1 via nebulizer up to 4 times daily as needed    Prednisone 20 Mg Tabs (Prednisone) ..... Uad    Qvar 80 Mcg/act Aers (Beclomethasone dipropionate) .Marland Kitchen... 2 ps two times a day  Orders: Prescription Created Electronically [phone])  Complete Medication List: 1)  Hydrochlorothiazide 25 Mg Tabs (Hydrochlorothiazide) .... Take 1 tablet by mouth every morning 2)  Adderall 10 Mg Tabs (Amphetamine-dextroamphetamine) .... Take by mouth @ 1 pm 3)  Adderall Xr 20 Mg Cp24 (Amphetamine-dextroamphetamine) .... Take 2 tabs every am 4)  Adderall Xr 20 Mg Cp24 (Amphetamine-dextroamphetamine) .... Take 2 tabs every am  fill in one month 5)  Singulair 10 Mg Tabs (Montelukast sodium) .... Take 1 tablet by mouth every morning 6)  Proair Hfa 108 (90 Base) Mcg/act Aers (Albuterol sulfate) .... Two puffs as needed for asthma 7)  Adderall 10 Mg Tabs (Amphetamine-dextroamphetamine) .... Take one tab at 1 pm  fill in one month 8)  Adderall 10 Mg Tabs (Amphetamine-dextroamphetamine) .... Take one tab at 1 pm  fill in two months 9)  Adderall Xr 20 Mg Xr24h-cap (Amphetamine-dextroamphetamine) .... Take 2 tabs every morning  fill in two months 10)  Detrol La 4 Mg Xr24h-cap (Tolterodine tartrate) .... Take one tab once daily 11)  Albuterol Sulfate (2.5 Mg/53ml) 0.083% Nebu (Albuterol sulfate) .Marland Kitchen.. 1 via nebulizer up to 4 times daily as needed 12)  Prednisone 20 Mg Tabs (Prednisone) .... Uad 13)  Qvar 80 Mcg/act Aers (Beclomethasone dipropionate) .... 2 ps two times a day  Patient Instructions: 1)  take 10 mg of Singulair daily, Qvar 80..2  puffs b.i.d., prednisone two tabs x 3 days, one x 3 days, a half x 3 days, then half a tablet Monday, Wednesday, Friday, for two week taper and 30 ounces of water daily. 2)  Stay on a complete fat free diet and start D. Prilosec 20 mg b.i.d. pain.  We will get u  set up for an ultrasound of y  gallbladder.  re- turned next Friday for follow-up Prescriptions: QVAR 80 MCG/ACT AERS (BECLOMETHASONE DIPROPIONATE) 2 ps two times a day  #2 x 6   Entered and Authorized by:   Roderick Pee MD   Signed by:   Roderick Pee MD on 11/24/2009   Method used:   Electronically to        [facility] ([facility]       [address]       [place], [place]  [address]       Ph: [phone] or [phone]       Fax: [phone]   RxID:   [id number] PREDNISONE 20 MG TABS (PREDNISONE) UAD  #50 x 1   Entered and Authorized by:   Roderick Pee MD   Signed by:   Roderick Pee MD on 11/24/2009   Method used:   Electronically to  [facility] (retail)       [address]       [place], [place]  [address]       Ph: [phone] or [phone]       Fax: [phone]   RxID:   [id number] UVOZ 36 MCG/ACT AERS (BECLOMETHASONE DIPROPIONATE) 2 ps two times a day  #2 x 6   Entered and Authorized by:   Roderick Pee MD   Signed by:   Roderick Pee MD on 11/24/2009   Method used:   Print then Give to Patient   RxID:   [id number] PREDNISONE 20 MG TABS (PREDNISONE) UAD  #50 x 1   Entered and Authorized by:   Roderick Pee MD   Signed by:   Roderick Pee MD on 11/24/2009   Method used:   Print then Give to Patient   RxID:   [id number] SINGULAIR 10 MG TABS (MONTELUKAST SODIUM) Take 1 tablet by mouth every morning  #100 x 3   Entered and Authorized by:   Roderick Pee MD   Signed by:   Roderick Pee MD on 11/24/2009   Method used:   Print then Give to Patient   RxID:   [id number] ADDERALL XR 20 MG XR24H-CAP (AMPHETAMINE-DEXTROAMPHETAMINE) take 2 tabs every morning  fill in  two months  #60 x 0   Entered by:   Kern Reap CMA (AAMA)   Authorized by:   Roderick Pee MD   Signed by:   Kern Reap CMA (AAMA) on 11/24/2009   Method used:   Print then Give to Patient   RxID:   [id number] ADDERALL 10 MG TABS (AMPHETAMINE-DEXTROAMPHETAMINE) take one tab at 1 pm  fill in two months  #30 x 0   Entered by:   Kern Reap CMA (AAMA)   Authorized by:   Roderick Pee MD   Signed by:   Kern Reap CMA (AAMA) on 11/24/2009   Method used:   Print then Give to Patient   RxID:   [id number] ADDERALL 10 MG TABS (AMPHETAMINE-DEXTROAMPHETAMINE) take one tab at 1 pm  fill in one month  #30 x 0   Entered by:   Kern Reap CMA (AAMA)   Authorized by:   Roderick Pee MD   Signed by:   Kern Reap CMA (AAMA) on 11/24/2009   Method used:   Print then Give to Patient   RxID:   [id number] ADDERALL XR 20 MG  CP24 (AMPHETAMINE-DEXTROAMPHETAMINE) Take 2 tabs every AM  fill in one month  #60 x 0   Entered by:   Kern Reap CMA (AAMA)   Authorized by:   Roderick Pee MD   Signed by:   Kern Reap CMA (AAMA) on 11/24/2009   Method used:   Print then Give to Patient   RxID:   [id number] ADDERALL XR 20 MG  CP24 (AMPHETAMINE-DEXTROAMPHETAMINE) Take 2 tabs every AM  #60 x 0   Entered by:   Kern Reap CMA (AAMA)   Authorized by:   Roderick Pee MD   Signed by:   Kern Reap CMA (AAMA) on 11/24/2009   Method used:   Print then Give to Patient   RxID:   [id number] ADDERALL 10 MG  TABS (AMPHETAMINE-DEXTROAMPHETAMINE) TAKE by mouth @ 1 PM  #30 x 0   Entered by:   Kern Reap CMA (AAMA)   Authorized by:   Roderick Pee MD   Signed by:   Fleet Contras  Vereen CMA (AAMA) on 11/24/2009   Method used:   Print then Give to Patient   RxID:   [id number]

## 2010-04-17 NOTE — Progress Notes (Signed)
Summary: Records request from Attorney Estil Daft  Request for records received from [facility]. Request forwarded to [employer]. Wilder Glade  May 15, 2009 12:22 PM

## 2010-04-17 NOTE — Progress Notes (Signed)
Summary: adderall refill  Phone Note Call from Patient   Summary of Call: pt request adderall refill Initial call taken by: Kern Reap CMA Duncan Dull),  March 06, 2009 11:27 AM  Follow-up for Phone Call        rx ready for pick up Follow-up by: Kern Reap CMA Duncan Dull),  March 06, 2009 2:16 PM    Prescriptions: ADDERALL XR 20 MG XR24H-CAP (AMPHETAMINE-DEXTROAMPHETAMINE) take 2 tabs every morning  fill in two months  #60 x 0   Entered by:   Kern Reap CMA (AAMA)   Authorized by:   Roderick Pee MD   Signed by:   Kern Reap CMA (AAMA) on 03/06/2009   Method used:   Print then Give to Patient   RxID:   [id number] ADDERALL XR 20 MG  CP24 (AMPHETAMINE-DEXTROAMPHETAMINE) Take 2 tabs every AM  fill in one month  #60 x 0   Entered by:   Kern Reap CMA (AAMA)   Authorized by:   Roderick Pee MD   Signed by:   Kern Reap CMA (AAMA) on 03/06/2009   Method used:   Print then Give to Patient   RxID:   [id number] ADDERALL XR 20 MG  CP24 (AMPHETAMINE-DEXTROAMPHETAMINE) Take 2 tabs every AM  #60 x 0   Entered by:   Kern Reap CMA (AAMA)   Authorized by:   Roderick Pee MD   Signed by:   Kern Reap CMA (AAMA) on 03/06/2009   Method used:   Print then Give to Patient   RxID:   [id number] ADDERALL 10 MG TABS (AMPHETAMINE-DEXTROAMPHETAMINE) take one tab at 1 pm  fill in two months  #30 x 0   Entered by:   Kern Reap CMA (AAMA)   Authorized by:   Roderick Pee MD   Signed by:   Kern Reap CMA (AAMA) on 03/06/2009   Method used:   Print then Give to Patient   RxID:   [id number] ADDERALL 10 MG TABS (AMPHETAMINE-DEXTROAMPHETAMINE) take one tab at 1 pm  fill in one month  #30 x 0   Entered by:   Kern Reap CMA (AAMA)   Authorized by:   Roderick Pee MD   Signed by:   Kern Reap CMA (AAMA) on 03/06/2009   Method used:   Print then Give to Patient   RxID:   [id number] ADDERALL 10 MG  TABS (AMPHETAMINE-DEXTROAMPHETAMINE) TAKE by  mouth @ 1 PM  #30 x 0   Entered by:   Kern Reap CMA (AAMA)   Authorized by:   Roderick Pee MD   Signed by:   Kern Reap CMA (AAMA) on 03/06/2009   Method used:   Print then Give to Patient   RxID:   [id number]

## 2010-04-17 NOTE — Progress Notes (Signed)
Summary: Pt req higher dosage for Detrol  Phone Note Call from Patient Call back at [phone] cell   Caller: Patient Summary of Call: Pt called and is req change of dosage to Detrol. Pls change from 2mg  to higher dosage amount and call in to [facility]   Initial call taken by: Lucy Antigua,  June 23, 2009 11:15 AM  Follow-up for Phone Call        increase detroll 4 mg daily, dispense 100 tablets, directions one daily refills x 2 Follow-up by: Roderick Pee MD,  June 23, 2009 3:45 PM  Additional Follow-up for Phone Call Additional follow up Details #1::        Phone Call Completed, Rx Called In Additional Follow-up by: Kern Reap CMA Duncan Dull),  June 23, 2009 5:28 PM    New/Updated Medications: DETROL LA 4 MG XR24H-CAP (TOLTERODINE TARTRATE) take one tab once daily Prescriptions: DETROL LA 4 MG XR24H-CAP (TOLTERODINE TARTRATE) take one tab once daily  #100 x 2   Entered by:   Kern Reap CMA (AAMA)   Authorized by:   Roderick Pee MD   Signed by:   Kern Reap CMA (AAMA) on 06/23/2009   Method used:   Electronically to        [id number] (retail)       [address]       [place], [place]  [address]       Ph: [phone] or [phone]       Fax: [phone]   RxID:   [id number]

## 2010-04-17 NOTE — Progress Notes (Signed)
Summary: Refill Proair HFA  Phone Note Refill Request Message from:  Pharmacy  Refills Requested: Medication #1:  PROAIR HFA 108 (90 BASE) MCG/ACT AERS two puffs as needed for asthma   Dosage confirmed as above?Dosage Confirmed Initial call taken by: Kathrynn Speed CMA,  October 26, 2009 3:02 PM    Prescriptions: PROAIR HFA 108 (90 BASE) MCG/ACT AERS (ALBUTEROL SULFATE) two puffs as needed for asthma  #1 x 2   Entered by:   Kathrynn Speed CMA   Authorized by:   Roderick Pee MD   Signed by:   Kathrynn Speed CMA on 10/26/2009   Method used:   Electronically to        [id number] (retail)       [address]       [place], [place]  [address]       Ph: [phone] or [phone]       Fax: [phone]   RxID:   [id number]

## 2010-04-17 NOTE — Progress Notes (Signed)
Summary: info & adderall not avail   Phone Note Call from Patient Call back at [phone]   Caller: vm Summary of Call: 1)Re ruptured disc neck - is it considered soft tissue?   2) Rx that is no longer available, generic adderall.  What to do?  Initial call taken by: Rudy Jew, RN,  January 03, 2010 5:12 PM  Follow-up for Phone Call         Fleet Contras please call Follow-up by: Roderick Pee MD,  January 04, 2010 8:13 AM  Additional Follow-up for Phone Call Additional follow up Details #1::        the cost is to much for name brand adderall can she try something else?   Additional Follow-up by: Kern Reap CMA Duncan Dull),  January 04, 2010 10:36 AM    Additional Follow-up for Phone Call Additional follow up Details #2::    have her call her insurance company and find out from their pharmacy benefit manager, what medications they will cover Follow-up by: Roderick Pee MD,  January 04, 2010 1:01 PM  Additional Follow-up for Phone Call Additional follow up Details #3:: Details for Additional Follow-up Action Taken: patient states insurance will pay for anything generic. daughter takes concerta.list I can certainly 27 mg, dispense 30, 1 daily Additional Follow-up by: Kern Reap CMA Duncan Dull),  January 04, 2010 3:09 PM  New/Updated Medications: CONCERTA 27 MG CR-TABS (METHYLPHENIDATE HCL) take one tab by mouth once daily Prescriptions: CONCERTA 27 MG CR-TABS (METHYLPHENIDATE HCL) take one tab by mouth once daily  #30 x 0   Entered by:   Kern Reap CMA (AAMA)   Authorized by:   Roderick Pee MD   Signed by:   Kern Reap CMA (AAMA) on 01/04/2010   Method used:   Print then Mail to Patient   RxID:   [id number]

## 2010-04-19 NOTE — Progress Notes (Signed)
Summary: refill request  Phone Note From Pharmacy   Summary of Call: refill request     New/Updated Medications: ALBUTEROL SULFATE (2.5 MG/3ML) 0.083% NEBU (ALBUTEROL SULFATE) inhale contents of 1 nebule 4 times a day as needed Prescriptions: ALBUTEROL SULFATE (2.5 MG/3ML) 0.083% NEBU (ALBUTEROL SULFATE) inhale contents of 1 nebule 4 times a day as needed  #300.0 ml x 3   Entered by:   Kern Reap CMA (AAMA)   Authorized by:   Roderick Pee MD   Signed by:   Kern Reap CMA (AAMA) on 04/11/2010   Method used:   Electronically to        [id number] (retail)       [address]       [place], [place]  [address]       Ph: [phone] or [phone]       Fax: [phone]   RxID:   [id number]

## 2010-04-19 NOTE — Medication Information (Signed)
Summary: Prior Authorization & Approval for Vyvanse/[employer]  Prior Authorization & Approval for Vyvanse/[employer]   Imported By: Lanelle Bal 03/09/2010 09:44:37  _____________________________________________________________________  External Attachment:    Type:   Image     Comment:   External Document

## 2010-04-19 NOTE — Progress Notes (Signed)
Summary: refill request  Phone Note Refill Request Message from:  Fax from Pharmacy on April 10, 2010 5:11 PM  Refills Requested: Medication #1:  PROAIR HFA 108 (90 BASE) MCG/ACT AERS two puffs as needed for asthma Initial call taken by: Kern Reap CMA Duncan Dull),  April 10, 2010 5:11 PM    Prescriptions: PROAIR HFA 108 (90 BASE) MCG/ACT AERS (ALBUTEROL SULFATE) two puffs as needed for asthma  #90 days x 2   Entered by:   Kern Reap CMA (AAMA)   Authorized by:   Roderick Pee MD   Signed by:   Kern Reap CMA (AAMA) on 04/10/2010   Method used:   Electronically to        [id number] (retail)       [address]       [place], [place]  [address]       Ph: [phone] or [phone]       Fax: [phone]   RxID:   [id number]

## 2010-07-04 ENCOUNTER — Other Ambulatory Visit: Payer: Self-pay | Admitting: *Deleted

## 2010-07-04 DIAGNOSIS — F988 Other specified behavioral and emotional disorders with onset usually occurring in childhood and adolescence: Secondary | ICD-10-CM

## 2010-07-04 MED ORDER — AMPHETAMINE-DEXTROAMPHETAMINE 10 MG PO TABS: 10.0000 mg | ORAL_TABLET | Freq: Every day | ORAL | Status: DC

## 2010-07-04 MED ORDER — AMPHETAMINE-DEXTROAMPHET ER 20 MG PO CP24: 20.0000 mg | ORAL_CAPSULE | Freq: Two times a day (BID) | ORAL | Status: DC

## 2010-07-04 MED ORDER — TOLTERODINE TARTRATE ER 4 MG PO CP24: 4.0000 mg | ORAL_CAPSULE | Freq: Every day | ORAL | Status: DC

## 2010-07-04 MED ORDER — BECLOMETHASONE DIPROPIONATE 80 MCG/ACT IN AERS: 1.0000 | INHALATION_SPRAY | RESPIRATORY_TRACT | Status: DC | PRN

## 2010-07-04 MED ORDER — MONTELUKAST SODIUM 10 MG PO TABS: 10.0000 mg | ORAL_TABLET | Freq: Every day | ORAL | Status: DC

## 2010-07-04 MED ORDER — PREDNISONE 20 MG PO TABS: 20.0000 mg | ORAL_TABLET | Freq: Every day | ORAL | Status: DC

## 2010-07-04 MED ORDER — HYDROCHLOROTHIAZIDE 25 MG PO TABS: 25.0000 mg | ORAL_TABLET | Freq: Every day | ORAL | Status: DC

## 2010-07-04 MED ORDER — ALBUTEROL SULFATE (2.5 MG/3ML) 0.083% IN NEBU: 2.5000 mg | INHALATION_SOLUTION | RESPIRATORY_TRACT | Status: DC | PRN

## 2010-07-04 MED ORDER — AMPHETAMINE-DEXTROAMPHET ER 20 MG PO CP24: 20.0000 mg | ORAL_CAPSULE | ORAL | Status: DC

## 2010-07-04 MED ORDER — ALBUTEROL SULFATE HFA 108 (90 BASE) MCG/ACT IN AERS: 2.0000 | INHALATION_SPRAY | Freq: Four times a day (QID) | RESPIRATORY_TRACT | Status: DC | PRN

## 2010-08-03 NOTE — Consult Note (Signed)
NAMEMIRTIE, BASTYR              ACCOUNT NO.:  [id number]   MEDICAL RECORD NO.:  [id number]          PATIENT TYPE:  MAT   LOCATION:  [facility]                          FACILITY:  [facility]   PHYSICIAN:  Lenoard Aden, M.D.DATE OF BIRTH:  [birth year]   DATE OF CONSULTATION:  12/09/2004  DATE OF DISCHARGE:                                   CONSULTATION   CHIEF COMPLAINT:  Abdominal pain.   HISTORY OF PRESENT ILLNESS:  The patient is a 50 year old African-American  female, gravida 2, para 2, status post laparoscopic supracervical  hysterectomy on December 05, 2004, who presents with 12-hour onset of  abdominal discomfort.   ALLERGIES:  PENICILLIN, CODEINE, ASPIRIN.   MEDICATIONS:  1.  Singulair.  2.  Adoral.  3.  Albuterol inhaler.  4.  Advair inhaler.  5.  Darvocet.  6.  Tylenol.   She stated that the Darvocet made her feel nauseous.  She tried Tylenol and  did not have any pain relief.  Had nausea with taking the Darvocet on one  occasion, but otherwise has felt well.   PHYSICAL EXAMINATION:  GENERAL:  She is a well-developed, well-nourished,  African-American female in no acute distress.  VITAL SIGNS:  Temperature 98.5, pulse 95, respirations 20, blood pressure  84/41.  HEENT:  Normal.  LUNGS:  Clear.  HEART:  Regular rate and rhythm.  ABDOMEN:  Soft and nontender.  Incision is well-healed.  No evidence of  rebound or guarding.  No evidence of any ecchymosis or wound dehiscence.  No  evidence of erythema or induration.  PELVIC:  Deferred.  EXTREMITIES:  No cords.  NEUROLOGY:  Nonfocal.   IMPRESSION:  Normal postoperative, post surgical discomfort, no evidence of  acute abdomen, no evidence of obstruction.   PLAN:  She will be discharged home and will be given a prescription for  Zofran that she can take in conjunction with her Darvocet.  She has follow-  up scheduled in the office within the next 5 days.  She is to follow up  sooner if no improvement.      Lenoard Aden, M.D.  Electronically Signed     RJT/MEDQ  D:  12/09/2004  T:  12/09/2004  Job:  [id number]

## 2010-08-03 NOTE — Op Note (Signed)
NAMEDODY, SMARTT              ACCOUNT NO.:  [id number]   MEDICAL RECORD NO.:  [id number]          PATIENT TYPE:  OBV   LOCATION:  9399                          [facility]ILITY:  [facility]   PHYSICIAN:  Gerri Spore B. Earlene Plater, M.D.  DATE OF BIRTH:  Sep 25, 1960   DATE OF PROCEDURE:  12/05/2004  DATE OF DISCHARGE:                                 OPERATIVE REPORT   PREOPERATIVE DIAGNOSIS:  Uterine fibroids.   POSTOPERATIVE DIAGNOSIS:  Uterine fibroids.   PROCEDURE:  Laparoscopic supracervical hysterectomy.   ANESTHESIA:  General.   SPECIMENS:  Uterus and upper portion of the cervix.   BLOOD LOSS:  100.   COMPLICATIONS:  None.   FINDINGS:  230 g fibroid uterus.  Normal-appearing tubes, ovaries, and upper  abdomen.   INDICATION:  A patient with a history of heavy menstrual bleeding, not  responding to medical management or endometrial ablation.  Preoperative  evaluation included history of normal Paps and a normal preoperative  endometrial biopsy.  The patient was advised of the risks of surgery  including infection, bleeding, damage to the surrounding organs as well as a  7% occasional staining or spotting rate and a 1% regular menstrual rate  after this procedure.  In addition, she was advised she will need to  continue to have yearly Pap smears.   PROCEDURE:  The patient was taken to the operating room and general  anesthesia obtained.  She was prepped and draped in the standard fashion.  A  Foley catheter was inserted into the bladder.   A 10 mm incision was placed in the umbilicus and carried sharply to the  fascia.  The fascia was divided sharply.  The posterior peritoneum was  elevated and entered sharply.  Purse-string suture of 0 Vicryl was placed in  the fascial defect.  The Hasson cannula was inserted and secured.  Pneumoperitoneum was obtained with CO2 gas.  Abdominal incision obtained and  11 mm port placed in the left lower quadrant under direct laparoscopic  visualization.  A  5 mm port was placed in the right lower quadrant in a  similar manner.   The bowel was mobilized superiorly and pelvis inspected.  The uterus was  very mobile and consisted of fibroids.  The course of each ureter was  identified and found to be well away from the area of interest.   The left round ligament was identified, sealed, and divided with the  Harmonic ACE.  The utero-ovarian ligament and the tube were similarly sealed  and divided with the Harmonic.  The bladder flap was created with a Harmonic  and the peritoneum skeletonized around the uterine artery. The bladder was  mobilized inferiorly bluntly.  The uterine artery was then grasped with the  Harmonic, sealed, and divided.  Hemostasis obtained.  The procedure was  repeated on the right side in the exact same manner.  The cervix was then  entered in a reversed cone fashion with the Harmonic.  This mobilized the  uterus and upper cervix.   The cervical canal was cauterized with the Harmonic on maximal setting for  20 seconds.  Gas was taken down to below 6 mm and the cervical stump was  hemostatic as were the uretero=ovarian pedicles and remaining areas of  resection.   The uterus was then morcellated in standard fashion.  At one point, the very  calcified fibroids were encountered.  This obstructed the tube of the  morcellator.  It was removed to be cleared, and on reinsertion, apparently a  branch of the epigastric artery was lacerated.  This was identified  immediately and ligated with a suture passer with 0 Vicryl with hemostasis  obtained.  The remainder of the morcellation went without incident.  The  debris was cleared from the abdomen and pelvis and the pelvis irrigated and  inspected.  It was hemostatic and no fragments were identified.  Interceed  was placed over the cervical stump.    The morcellator was removed and the fascial defect reapproximated with the  suture that had been placed previously.  The area was  inspected  laparoscopically and was hemostatic.  With deflation of the abdomen, again  hemostasis was noted.  The other port was removed and the site hemostatic.   The scope was removed, gas released, and Hasson cannula removed.  The  abdominal wall was elevated at the incision of the umbilicus without any  traction.  The purse-string suture was snugged down.  This closed the  fascial defect.  No intra-abdominal contents herniated through prior to  closure.  The skin was closed at the umbilicus with subcuticular 4-0 Vicryl.  The subcutaneous tissues were reapproximated with a single stitch of 4-0  Vicryl in the left lower quadrant and the skin reapproximated in each lower  quadrant with Dermabond.   The patient tolerated the procedure well, no complications.  She was taken  to the recovery room awake, alert, and in stable condition.  All counts were  correct per the operating room staff.      Gerri Spore B. Earlene Plater, M.D.  Electronically Signed     WBD/MEDQ  D:  12/05/2004  T:  12/05/2004  Job:  [id number]

## 2011-01-06 ENCOUNTER — Other Ambulatory Visit: Payer: Self-pay | Admitting: Family Medicine

## 2011-01-09 ENCOUNTER — Telehealth: Payer: Self-pay | Admitting: *Deleted

## 2011-01-09 NOTE — Telephone Encounter (Signed)
Requesting refills of adderall

## 2011-01-10 MED ORDER — AMPHETAMINE-DEXTROAMPHETAMINE 10 MG PO TABS: 10.0000 mg | ORAL_TABLET | Freq: Every day | ORAL | Status: DC

## 2011-01-10 NOTE — Telephone Encounter (Signed)
rx ready for pickup 

## 2011-01-18 ENCOUNTER — Other Ambulatory Visit: Payer: Self-pay | Admitting: Family Medicine

## 2011-01-18 MED ORDER — MONTELUKAST SODIUM 10 MG PO TABS: 10.0000 mg | ORAL_TABLET | Freq: Every day | ORAL | Status: DC

## 2011-01-22 NOTE — Telephone Encounter (Signed)
Walk in-----refill Adderall. Thanks. Call [phone]. Also checking on PA status of her Detrol.

## 2011-01-23 NOTE — Telephone Encounter (Signed)
rx ready for pickup 

## 2011-02-03 ENCOUNTER — Other Ambulatory Visit: Payer: Self-pay | Admitting: Family Medicine

## 2011-02-05 ENCOUNTER — Other Ambulatory Visit: Payer: Self-pay | Admitting: Family Medicine

## 2011-02-05 ENCOUNTER — Encounter: Payer: Self-pay | Admitting: *Deleted

## 2011-02-05 DIAGNOSIS — R32 Unspecified urinary incontinence: Secondary | ICD-10-CM | POA: Insufficient documentation

## 2011-02-05 MED ORDER — AMPHETAMINE-DEXTROAMPHET ER 20 MG PO CP24: 20.0000 mg | ORAL_CAPSULE | ORAL | Status: DC

## 2011-02-05 NOTE — Telephone Encounter (Signed)
Pt called and said that she needs to get refills on amphetamine-dextroamphetamine (ADDERALL XR, 20MG ,) 20 MG 24 hr capsule. Pls call when ready for pick up. Pt is completely out and needs to pick up script today.

## 2011-02-06 NOTE — Telephone Encounter (Signed)
rx ready for pick up. Left message on machine for patient. 

## 2011-03-22 ENCOUNTER — Telehealth: Payer: Self-pay | Admitting: Family Medicine

## 2011-03-22 NOTE — Telephone Encounter (Signed)
Pt picked up her 3 rxs for generic Adderall 20mg  1qd. Patient stated that she should be taking either 40mg  1qd or 20mg  bid. Since she was out of meds, she took 1 of the rxs to be filled now and left the 2 others here. Please call patient when new rxs are ready. Thanks.

## 2011-03-25 MED ORDER — AMPHETAMINE-DEXTROAMPHETAMINE 20 MG PO TABS: 20.0000 mg | ORAL_TABLET | Freq: Two times a day (BID) | ORAL | Status: DC

## 2011-03-25 MED ORDER — AMPHETAMINE-DEXTROAMPHETAMINE 20 MG PO TABS: 20.0000 mg | ORAL_TABLET | Freq: Every day | ORAL | Status: DC

## 2011-03-25 NOTE — Telephone Encounter (Signed)
rx ready for pickup 

## 2011-03-29 ENCOUNTER — Ambulatory Visit (INDEPENDENT_AMBULATORY_CARE_PROVIDER_SITE_OTHER): Payer: BC Managed Care – PPO | Admitting: Family Medicine

## 2011-03-29 ENCOUNTER — Encounter: Payer: Self-pay | Admitting: Family Medicine

## 2011-03-29 DIAGNOSIS — J40 Bronchitis, not specified as acute or chronic: Secondary | ICD-10-CM

## 2011-03-29 DIAGNOSIS — J45909 Unspecified asthma, uncomplicated: Secondary | ICD-10-CM

## 2011-03-29 MED ORDER — HYDROCODONE-HOMATROPINE 5-1.5 MG/5ML PO SYRP: 5.0000 mL | ORAL_SOLUTION | ORAL | Status: AC | PRN

## 2011-03-29 MED ORDER — METHYLPREDNISOLONE ACETATE 80 MG/ML IJ SUSP
160.0000 mg | Freq: Once | INTRAMUSCULAR | Status: AC
  Administered 2011-03-29: 160 mg via INTRAMUSCULAR

## 2011-03-29 MED ORDER — AZITHROMYCIN 250 MG PO TABS: ORAL_TABLET | ORAL | Status: AC

## 2011-03-29 MED ORDER — ALBUTEROL SULFATE (2.5 MG/3ML) 0.083% IN NEBU: 2.5000 mg | INHALATION_SOLUTION | RESPIRATORY_TRACT | Status: DC | PRN

## 2011-03-29 MED ORDER — IPRATROPIUM-ALBUTEROL 0.5-2.5 (3) MG/3ML IN SOLN
3.0000 mL | Freq: Once | RESPIRATORY_TRACT | Status: AC
  Administered 2011-03-29: 3 mL via RESPIRATORY_TRACT

## 2011-03-29 NOTE — Progress Notes (Signed)
  Subjective:    Patient ID: Carrie [name], female    DOB: May 22, 1960, 51 y.o.   MRN: [id number]  HPI Here for 2 days of coughing up green sputum, SOB, wheezing, ST , and PND. No fever. She has used her nebulizer at home twice, last night and this am. She has not used her Qvar for a long while.    Review of Systems  Constitutional: Negative.   HENT: Positive for postnasal drip.   Eyes: Negative.   Respiratory: Positive for cough, chest tightness, shortness of breath and wheezing.   Cardiovascular: Negative.        Objective:   Physical Exam  Constitutional: She appears well-developed and well-nourished. No distress.  HENT:  Right Ear: External ear normal.  Left Ear: External ear normal.  Nose: Nose normal.  Mouth/Throat: Oropharynx is clear and moist. No oropharyngeal exudate.  Eyes: Conjunctivae are normal.  Pulmonary/Chest: Effort normal. She has no rales.       Scattered wheezes and rhonchi  Lymphadenopathy:    She has no cervical adenopathy.          Assessment & Plan:  Acute bronchitis with an asthma exacerbation. Out of work today until 04-01-11.

## 2011-03-29 NOTE — Progress Notes (Signed)
Addended by: Aniceto Boss A on: 03/29/2011 01:01 PM   Modules accepted: Orders

## 2011-04-04 ENCOUNTER — Ambulatory Visit: Payer: Self-pay | Admitting: Family Medicine

## 2011-09-20 ENCOUNTER — Other Ambulatory Visit: Payer: Self-pay | Admitting: Family Medicine

## 2011-09-20 NOTE — Telephone Encounter (Signed)
Pt called req 3 month scripts for amphetamine-dextroamphetamine (ADDERALL XR, 20MG ,) 20 MG 24 hr capsule and amphetamine-dextroamphetamine (ADDERALL) 10 MG tablet

## 2011-09-23 MED ORDER — AMPHETAMINE-DEXTROAMPHETAMINE 10 MG PO TABS: 10.0000 mg | ORAL_TABLET | Freq: Every day | ORAL | Status: DC

## 2011-09-23 MED ORDER — AMPHETAMINE-DEXTROAMPHETAMINE 20 MG PO TABS: 20.0000 mg | ORAL_TABLET | Freq: Two times a day (BID) | ORAL | Status: DC

## 2011-09-23 NOTE — Telephone Encounter (Signed)
rx ready for pick up and Left message on machine for patient 

## 2011-10-01 ENCOUNTER — Encounter: Payer: Self-pay | Admitting: Family Medicine

## 2011-10-01 ENCOUNTER — Ambulatory Visit (INDEPENDENT_AMBULATORY_CARE_PROVIDER_SITE_OTHER): Payer: BC Managed Care – PPO | Admitting: Family Medicine

## 2011-10-01 VITALS — BP 118/78 | Temp 98.6°F | Wt 184.0 lb

## 2011-10-01 DIAGNOSIS — J45909 Unspecified asthma, uncomplicated: Secondary | ICD-10-CM

## 2011-10-01 MED ORDER — PREDNISONE 20 MG PO TABS: ORAL_TABLET | ORAL | Status: DC

## 2011-10-01 NOTE — Patient Instructions (Signed)
Take the prednisone as directed return in 3 weeks for followup

## 2011-10-01 NOTE — Progress Notes (Signed)
  Subjective:    Patient ID: Carrie [name], female    DOB: 1960-07-28, 51 y.o.   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 It sugar asthma over the last couple weeks. A week ago it got worse. She's been using her nebulizer at bedtime and sleeping okay. She uses the inhaler Qvar 80 one puff twice a day Singulair 10 mg daily albuterol when necessary    Review of Systems    general and pulmonary review of systems otherwise negative Objective:   Physical Exam  Well-developed well-nourished female no acute distress HEENT negative neck was supple lungs showed symmetrical breath sounds and mild late expiratory wheezing      Assessment & Plan:

## 2011-11-28 ENCOUNTER — Other Ambulatory Visit: Payer: BC Managed Care – PPO

## 2011-12-12 ENCOUNTER — Encounter: Payer: BC Managed Care – PPO | Admitting: Family Medicine

## 2012-02-08 ENCOUNTER — Other Ambulatory Visit: Payer: Self-pay | Admitting: Family Medicine

## 2012-02-09 ENCOUNTER — Other Ambulatory Visit: Payer: Self-pay | Admitting: Family Medicine

## 2012-03-20 ENCOUNTER — Other Ambulatory Visit: Payer: Self-pay | Admitting: Family Medicine

## 2012-03-20 NOTE — Telephone Encounter (Signed)
Pt needs new rxs generic adderall 10 mg  Once a day and generic adderall 20 mg twice a day.

## 2012-03-20 NOTE — Telephone Encounter (Signed)
Please refill her Adderall prescriptions for 3 months

## 2012-04-04 ENCOUNTER — Other Ambulatory Visit: Payer: Self-pay | Admitting: Family Medicine

## 2012-04-06 ENCOUNTER — Other Ambulatory Visit: Payer: Self-pay | Admitting: Family Medicine

## 2012-04-06 MED ORDER — AMPHETAMINE-DEXTROAMPHETAMINE 20 MG PO TABS: 20.0000 mg | ORAL_TABLET | Freq: Two times a day (BID) | ORAL | Status: DC

## 2012-04-06 MED ORDER — AMPHETAMINE-DEXTROAMPHETAMINE 10 MG PO TABS: 10.0000 mg | ORAL_TABLET | Freq: Every day | ORAL | Status: DC

## 2012-05-04 ENCOUNTER — Other Ambulatory Visit (INDEPENDENT_AMBULATORY_CARE_PROVIDER_SITE_OTHER): Payer: BC Managed Care – PPO

## 2012-05-04 DIAGNOSIS — Z0000 Encounter for general adult medical examination without abnormal findings: Secondary | ICD-10-CM

## 2012-05-04 LAB — POCT URINALYSIS DIPSTICK
Bilirubin, UA: NEGATIVE
Glucose, UA: NEGATIVE
Ketones, UA: NEGATIVE
Leukocytes, UA: NEGATIVE
Nitrite, UA: NEGATIVE
Protein, UA: NEGATIVE
Spec Grav, UA: 1.025
Urobilinogen, UA: 1
pH, UA: 6.5

## 2012-05-04 LAB — BASIC METABOLIC PANEL
BUN: 12 mg/dL (ref 6–23)
CO2: 31 mEq/L (ref 19–32)
Calcium: 8.7 mg/dL (ref 8.4–10.5)
Chloride: 104 mEq/L (ref 96–112)
Creatinine, Ser: 0.7 mg/dL (ref 0.4–1.2)
GFR: 120.89 mL/min (ref >60.00)
Glucose, Bld: 83 mg/dL (ref 70–99)
Potassium: 3.3 mEq/L — ABNORMAL LOW (ref 3.5–5.1)
Sodium: 142 mEq/L (ref 135–145)

## 2012-05-04 LAB — HEPATIC FUNCTION PANEL
ALT: 19 U/L (ref 0–35)
AST: 15 U/L (ref 0–37)
Albumin: 3.7 g/dL (ref 3.5–5.2)
Alkaline Phosphatase: 61 U/L (ref 39–117)
Bilirubin, Direct: 0.1 mg/dL (ref 0.0–0.3)
Total Bilirubin: 0.8 mg/dL (ref 0.3–1.2)
Total Protein: 6.3 g/dL (ref 6.0–8.3)

## 2012-05-04 LAB — CBC WITH DIFFERENTIAL/PLATELET
Basophils Absolute: 0 10*3/uL (ref 0.0–0.1)
Basophils Relative: 0.4 % (ref 0.0–3.0)
Eosinophils Absolute: 0.2 10*3/uL (ref 0.0–0.7)
Eosinophils Relative: 2.2 % (ref 0.0–5.0)
HCT: 40.2 % (ref 36.0–46.0)
Hemoglobin: 13.3 g/dL (ref 12.0–15.0)
Lymphocytes Relative: 38.8 % (ref 12.0–46.0)
Lymphs Abs: 2.8 10*3/uL (ref 0.7–4.0)
MCHC: 33.1 g/dL (ref 30.0–36.0)
MCV: 91.7 fl (ref 78.0–100.0)
Monocytes Absolute: 0.4 10*3/uL (ref 0.1–1.0)
Monocytes Relative: 5.9 % (ref 3.0–12.0)
Neutro Abs: 3.8 10*3/uL (ref 1.4–7.7)
Neutrophils Relative %: 52.7 % (ref 43.0–77.0)
Platelets: 254 10*3/uL (ref 150.0–400.0)
RBC: 4.38 Mil/uL (ref 3.87–5.11)
RDW: 14 % (ref 11.5–14.6)
WBC: 7.2 10*3/uL (ref 4.5–10.5)

## 2012-05-04 LAB — LIPID PANEL
Cholesterol: 195 mg/dL (ref 0–200)
HDL: 63.4 mg/dL (ref >39.00)
LDL Cholesterol: 120 mg/dL — ABNORMAL HIGH (ref 0–99)
Total CHOL/HDL Ratio: 3
Triglycerides: 57 mg/dL (ref 0.0–149.0)
VLDL: 11.4 mg/dL (ref 0.0–40.0)

## 2012-05-04 LAB — TSH: TSH: 2.47 u[IU]/mL (ref 0.35–5.50)

## 2012-05-11 ENCOUNTER — Encounter: Payer: Self-pay | Admitting: Family Medicine

## 2012-05-11 ENCOUNTER — Ambulatory Visit (INDEPENDENT_AMBULATORY_CARE_PROVIDER_SITE_OTHER): Payer: BC Managed Care – PPO | Admitting: Family Medicine

## 2012-05-11 VITALS — BP 120/80 | Temp 98.3°F | Ht 63.0 in | Wt 197.0 lb

## 2012-05-11 DIAGNOSIS — M5020 Other cervical disc displacement, unspecified cervical region: Secondary | ICD-10-CM

## 2012-05-11 DIAGNOSIS — F988 Other specified behavioral and emotional disorders with onset usually occurring in childhood and adolescence: Secondary | ICD-10-CM

## 2012-05-11 DIAGNOSIS — R32 Unspecified urinary incontinence: Secondary | ICD-10-CM

## 2012-05-11 DIAGNOSIS — R319 Hematuria, unspecified: Secondary | ICD-10-CM

## 2012-05-11 DIAGNOSIS — J45909 Unspecified asthma, uncomplicated: Secondary | ICD-10-CM

## 2012-05-11 DIAGNOSIS — H6123 Impacted cerumen, bilateral: Secondary | ICD-10-CM

## 2012-05-11 DIAGNOSIS — R9389 Abnormal findings on diagnostic imaging of other specified body structures: Secondary | ICD-10-CM

## 2012-05-11 DIAGNOSIS — Z23 Encounter for immunization: Secondary | ICD-10-CM

## 2012-05-11 DIAGNOSIS — H6120 Impacted cerumen, unspecified ear: Secondary | ICD-10-CM

## 2012-05-11 DIAGNOSIS — R93429 Abnormal radiologic findings on diagnostic imaging of unspecified kidney: Secondary | ICD-10-CM

## 2012-05-11 LAB — POCT URINALYSIS DIPSTICK
Bilirubin, UA: NEGATIVE
Glucose, UA: NEGATIVE
Ketones, UA: NEGATIVE
Leukocytes, UA: NEGATIVE
Nitrite, UA: NEGATIVE
Spec Grav, UA: 1.01
Urobilinogen, UA: 2
pH, UA: 6

## 2012-05-11 MED ORDER — PREDNISONE 5 MG PO TABS: 5.0000 mg | ORAL_TABLET | Freq: Every day | ORAL | Status: DC

## 2012-05-11 MED ORDER — TOLTERODINE TARTRATE ER 4 MG PO CP24: ORAL_CAPSULE | ORAL | Status: DC

## 2012-05-11 MED ORDER — HYDROCHLOROTHIAZIDE 25 MG PO TABS: ORAL_TABLET | ORAL | Status: DC

## 2012-05-11 MED ORDER — PREDNISONE 5 MG PO TABS: ORAL_TABLET | ORAL | Status: DC

## 2012-05-11 MED ORDER — PREDNISONE 10 MG PO TABS: 10.0000 mg | ORAL_TABLET | Freq: Every day | ORAL | Status: DC

## 2012-05-11 MED ORDER — MONTELUKAST SODIUM 10 MG PO TABS: ORAL_TABLET | ORAL | Status: DC

## 2012-05-11 MED ORDER — PREDNISONE 20 MG PO TABS: ORAL_TABLET | ORAL | Status: DC

## 2012-05-11 NOTE — Patient Instructions (Addendum)
Taper your prednisone by 1 mg per month,,,,,,,,,,,,9 milligrams in the morning,,,,,,, 10 mg at bedtime  In one month go to 9 mg in the morning and 9 mg at bedtime  Return in 3 months for followup sooner if any problems

## 2012-05-11 NOTE — Progress Notes (Signed)
  Subjective:    Patient ID: Carrie [name], female    DOB: 15-Oct-1960, 52 y.o.   MRN: [id number]  HPI Carrie [name] is a 52 year old married female nonsmoker who comes in today for general physical examination because of a history of adult ADD, asthma, and urinary incontinence. Her current dose of prednisone is 20 mg twice a day. When she drops lower than that her asthma flares up. She's also on Qvar 82 puffs twice a day, Singulair 10 mg daily, albuterol when necessary, and nebulizer when necessary  She'll to takes Detrol 4 mg daily for urinary incontinence.  She takes Adderall 20 mg twice a day for adult ADD  She gets routine eye care, dental care, BSE monthly, and you mammography, colonoscopy x3 because her mother died of colon cancer. She's had no history of polyps however she continues to go every 5 years  She'll uterus removed for fibroids ovaries were left intact   Review of Systems  Constitutional: Negative.   HENT: Negative.   Eyes: Negative.   Respiratory: Negative.   Cardiovascular: Negative.   Gastrointestinal: Negative.   Genitourinary: Negative.   Musculoskeletal: Negative.   Neurological: Negative.   Psychiatric/Behavioral: Negative.        Objective:   Physical Exam  Constitutional: She appears well-developed and well-nourished.  HENT:  Head: Normocephalic and atraumatic.  Right Ear: External ear normal.  Left Ear: External ear normal.  Nose: Nose normal.  Mouth/Throat: Oropharynx is clear and moist.  Eyes: EOM are normal. Pupils are equal, round, and reactive to light.  Neck: Normal range of motion. Neck supple. No thyromegaly present.  Cardiovascular: Normal rate, regular rhythm, normal heart sounds and intact distal pulses.  Exam reveals no gallop and no friction rub.   No murmur heard. Pulmonary/Chest: Effort normal and breath sounds normal.  Abdominal: Soft. Bowel sounds are normal. She exhibits no distension and no mass. There is no tenderness. There is no  rebound.  Genitourinary: Vagina normal. Guaiac negative stool. No vaginal discharge found.  Bilateral breast exam normal  Musculoskeletal: Normal range of motion.  Lymphadenopathy:    She has no cervical adenopathy.  Neurological: She is alert. She has normal reflexes. No cranial nerve deficit. She exhibits normal muscle tone. Coordination normal.  Skin: Skin is warm and dry.  Psychiatric: She has a normal mood and affect. Her behavior is normal. Judgment and thought content normal.          Assessment & Plan:  Healthy female  Adult ADD continue Adderall  Urinary incontinence continue Detrol  Asthma taper prednisone by 1 mg per month for followup in 3 months  Overweight encouraged diet exercise and weight loss

## 2012-05-15 ENCOUNTER — Telehealth: Payer: Self-pay | Admitting: Family Medicine

## 2012-05-15 MED ORDER — ALBUTEROL SULFATE (2.5 MG/3ML) 0.083% IN NEBU: INHALATION_SOLUTION | RESPIRATORY_TRACT | Status: DC

## 2012-05-15 MED ORDER — ALBUTEROL SULFATE HFA 108 (90 BASE) MCG/ACT IN AERS: INHALATION_SPRAY | RESPIRATORY_TRACT | Status: DC

## 2012-05-15 NOTE — Telephone Encounter (Signed)
Pt was seen recently and proventil inhaler was never received. [facility]

## 2012-05-20 ENCOUNTER — Telehealth: Payer: Self-pay | Admitting: Family Medicine

## 2012-05-20 NOTE — Telephone Encounter (Signed)
Insurance Co will not cover the albuterol (PROVENTIL HFA) 108 (90 BASE) MCG/ACT inhaler. But they will cover 2 alternates 1. Ventolin inhaler (by Glasco).  2.  ProAir inhaler (by teva) Time sensitive. Pls respond ASAP. [phone]  Ref no: [id number]

## 2012-05-21 NOTE — Telephone Encounter (Signed)
Sent new Rx -fax

## 2012-06-09 ENCOUNTER — Ambulatory Visit
Admission: RE | Admit: 2012-06-09 | Discharge: 2012-06-09 | Disposition: A | Discharge status: Home / Self Care | Payer: BC Managed Care – PPO | Source: Ambulatory Visit | Attending: Family Medicine | Admitting: Family Medicine

## 2012-06-09 DIAGNOSIS — R93429 Abnormal radiologic findings on diagnostic imaging of unspecified kidney: Secondary | ICD-10-CM

## 2012-06-10 ENCOUNTER — Telehealth: Payer: Self-pay | Admitting: Family Medicine

## 2012-06-10 DIAGNOSIS — R932 Abnormal findings on diagnostic imaging of liver and biliary tract: Secondary | ICD-10-CM

## 2012-06-10 NOTE — Telephone Encounter (Signed)
Pt would like abd ultrasound results done yesterday

## 2012-06-10 NOTE — Telephone Encounter (Signed)
Patient states that when she has had MRI's in the past she has an asthma flare up.  Can something for anxiety be called into pharmacy?

## 2012-06-10 NOTE — Telephone Encounter (Signed)
Message copied by Trenton Gammon on Wed Jun 10, 2012  2:57 PM ------      Message from: TODD, JEFFREY A      Created: Wed Jun 10, 2012  1:04 PM       Fleet Contras please call,,,,,,,,,,, her abdominal ultrasound looks normal except very is a report of a new cystic type lesion that was not previously present. The radiologist is recommending a MRI for followup. Tell her that this will be arranged and she will be called with the date and time ------

## 2012-06-10 NOTE — Telephone Encounter (Signed)
MRI ordered

## 2012-06-11 ENCOUNTER — Other Ambulatory Visit: Payer: Self-pay | Admitting: Family Medicine

## 2012-06-11 DIAGNOSIS — R932 Abnormal findings on diagnostic imaging of liver and biliary tract: Secondary | ICD-10-CM

## 2012-06-11 NOTE — Telephone Encounter (Signed)
Fleet Contras please have not call set up him for the open MRI that we'll not trigger her asthma

## 2012-06-18 ENCOUNTER — Ambulatory Visit
Admission: RE | Admit: 2012-06-18 | Discharge: 2012-06-18 | Disposition: A | Discharge status: Home / Self Care | Payer: BC Managed Care – PPO | Source: Ambulatory Visit | Attending: Family Medicine | Admitting: Family Medicine

## 2012-06-18 DIAGNOSIS — R932 Abnormal findings on diagnostic imaging of liver and biliary tract: Secondary | ICD-10-CM

## 2012-06-18 MED ORDER — GADOBENATE DIMEGLUMINE 529 MG/ML IV SOLN
18.0000 mL | Freq: Once | INTRAVENOUS | Status: AC | PRN
  Administered 2012-06-18: 18 mL via INTRAVENOUS

## 2012-07-07 ENCOUNTER — Ambulatory Visit (INDEPENDENT_AMBULATORY_CARE_PROVIDER_SITE_OTHER): Payer: BC Managed Care – PPO | Admitting: Family Medicine

## 2012-07-07 DIAGNOSIS — K7689 Other specified diseases of liver: Secondary | ICD-10-CM

## 2012-07-07 NOTE — Progress Notes (Signed)
  Subjective:    Patient ID: Arneta Cliche, female    DOB: 1960/04/21, 52 y.o.   MRN: [id number]  Carrie [name] is a 52 year old married female nonsmoker who comes in today for evaluation of abdominal pain  We saw her recently for evaluation of abdominal pain. Wheezes were suspicious it might be her gallbladder therefore ultrasound of her gallbladder was done which was normal however she had some lesions in her liver and one lesion or daily. Followup MRI was done and she's here to review the data    Review of Systems Review of systems negative    Objective:   Physical Exam Well-developed and nourished female no acute distress reviewing the MRI the abdomen shows the 2 lesions are benign       Assessment & Plan:  Benign lesions in the abdomen reassured return when necessary

## 2012-07-07 NOTE — Patient Instructions (Addendum)
The lesions that we see on the MRI are benign return when necessary

## 2012-07-23 ENCOUNTER — Encounter: Payer: Self-pay | Admitting: Family Medicine

## 2012-09-14 ENCOUNTER — Telehealth: Payer: Self-pay | Admitting: Family Medicine

## 2012-09-14 MED ORDER — AMPHETAMINE-DEXTROAMPHETAMINE 10 MG PO TABS: 10.0000 mg | ORAL_TABLET | Freq: Every day | ORAL | Status: DC

## 2012-09-14 MED ORDER — AMPHETAMINE-DEXTROAMPHETAMINE 20 MG PO TABS: 20.0000 mg | ORAL_TABLET | Freq: Two times a day (BID) | ORAL | Status: DC

## 2012-09-14 NOTE — Telephone Encounter (Signed)
Rx ready for pick up and patient is aware 

## 2012-09-14 NOTE — Telephone Encounter (Signed)
Pt requesting new rx on her Adderall 20mg  and Adderall 10mg . Please call when ready for pick up

## 2012-11-18 ENCOUNTER — Ambulatory Visit (INDEPENDENT_AMBULATORY_CARE_PROVIDER_SITE_OTHER): Payer: BC Managed Care – PPO

## 2012-11-18 DIAGNOSIS — Z23 Encounter for immunization: Secondary | ICD-10-CM

## 2013-04-01 ENCOUNTER — Telehealth: Payer: Self-pay | Admitting: Family Medicine

## 2013-04-01 MED ORDER — AMPHETAMINE-DEXTROAMPHETAMINE 20 MG PO TABS: 20.0000 mg | ORAL_TABLET | Freq: Two times a day (BID) | ORAL | Status: DC

## 2013-04-01 MED ORDER — AMPHETAMINE-DEXTROAMPHETAMINE 10 MG PO TABS: 10.0000 mg | ORAL_TABLET | Freq: Every day | ORAL | Status: DC

## 2013-04-01 NOTE — Telephone Encounter (Signed)
Rx ready for pick up.  Left message on machine for patient.

## 2013-04-01 NOTE — Telephone Encounter (Signed)
Pt needs new rxs generic adderall 20 mg and generic adderall 10 mg

## 2013-05-16 ENCOUNTER — Other Ambulatory Visit: Payer: Self-pay | Admitting: Family Medicine

## 2013-06-03 ENCOUNTER — Encounter: Payer: Self-pay | Admitting: Family Medicine

## 2013-06-03 ENCOUNTER — Ambulatory Visit (INDEPENDENT_AMBULATORY_CARE_PROVIDER_SITE_OTHER): Payer: BC Managed Care – PPO | Admitting: Family Medicine

## 2013-06-03 VITALS — BP 120/78 | Temp 98.4°F | Wt 198.0 lb

## 2013-06-03 DIAGNOSIS — M79609 Pain in unspecified limb: Secondary | ICD-10-CM

## 2013-06-03 DIAGNOSIS — M25512 Pain in left shoulder: Secondary | ICD-10-CM | POA: Insufficient documentation

## 2013-06-03 MED ORDER — HYDROCODONE-ACETAMINOPHEN 10-325 MG PO TABS: 1.0000 | ORAL_TABLET | Freq: Three times a day (TID) | ORAL | Status: DC | PRN

## 2013-06-03 NOTE — Patient Instructions (Signed)
Ice on your left shoulder tonight  Vicodin....... one half to one tablet every 4-6 hours for severe pain  Call the orthopedic office,,,,,,,,,,,,,,,, [phone]...... asked to see Dr. Lennette Bihari supple or one of his associates tomorrow

## 2013-06-03 NOTE — Progress Notes (Signed)
   Subjective:    Patient ID: Carrie [name], female    DOB: 1961/02/12, 53 y.o.   MRN: [id number]  HPI Carrie [name] is a 53 year old married female nonsmoker who comes in today for evaluation of pain in her left shoulder  She says at exactly 55 yesterday she had the sudden onset of severe pain in her left shoulder. It felt sharp like a knife. She had difficulty sleeping last night because of the pain. She said no history of trauma although she had that shoulder manipulated about 5 years ago under anesthesia when she had treatment to her right shoulder for frozen shoulder.   Review of Systems    review of systems otherwise negative well-developed well nourished Objective:   Physical Exam Well-developed and nourished female no acute distress vital signs stable she is afebrile examination of the shoulder shows tenderness to palpation superiorly and marked decreased external range of motion       Assessment & Plan:  Severe pain left shoulder.......... cover tonight with Vicodin.....Marland Kitchen or so consult tomorrow

## 2013-07-07 ENCOUNTER — Encounter: Payer: Self-pay | Admitting: Internal Medicine

## 2013-07-27 ENCOUNTER — Telehealth: Payer: Self-pay | Admitting: Family Medicine

## 2013-07-27 NOTE — Telephone Encounter (Signed)
Pt needs new rx generic adderall 10 mg °

## 2013-07-29 MED ORDER — AMPHETAMINE-DEXTROAMPHETAMINE 10 MG PO TABS: 10.0000 mg | ORAL_TABLET | Freq: Every day | ORAL | Status: DC

## 2013-07-29 NOTE — Telephone Encounter (Signed)
Rx ready for pick up and left message on machine 

## 2013-07-30 ENCOUNTER — Other Ambulatory Visit: Payer: Self-pay | Admitting: *Deleted

## 2013-07-30 MED ORDER — AMPHETAMINE-DEXTROAMPHETAMINE 20 MG PO TABS: 20.0000 mg | ORAL_TABLET | Freq: Two times a day (BID) | ORAL | Status: DC

## 2013-08-14 ENCOUNTER — Other Ambulatory Visit: Payer: Self-pay | Admitting: Family Medicine

## 2013-09-21 ENCOUNTER — Encounter: Payer: Self-pay | Admitting: Internal Medicine

## 2013-09-27 ENCOUNTER — Telehealth: Payer: Self-pay | Admitting: Family Medicine

## 2013-09-27 NOTE — Telephone Encounter (Signed)
Pt has an  appt 09/28/13 at 11:45 and she has a conflict and work that day and would like to know if she can call in at that time and talk to Dr Sherren Mocha and if so what number can she call him at.. She also would like to know if her FMLA paperwork has been filled out.

## 2013-09-28 ENCOUNTER — Encounter: Payer: Self-pay | Admitting: Family Medicine

## 2013-09-28 ENCOUNTER — Ambulatory Visit (INDEPENDENT_AMBULATORY_CARE_PROVIDER_SITE_OTHER): Payer: BC Managed Care – PPO | Admitting: Family Medicine

## 2013-09-28 VITALS — BP 130/90 | Temp 98.9°F | Wt 194.0 lb

## 2013-09-28 DIAGNOSIS — F988 Other specified behavioral and emotional disorders with onset usually occurring in childhood and adolescence: Secondary | ICD-10-CM

## 2013-09-28 DIAGNOSIS — J45909 Unspecified asthma, uncomplicated: Secondary | ICD-10-CM

## 2013-09-28 MED ORDER — METHYLPHENIDATE HCL ER (OSM) 36 MG PO TBCR: 36.0000 mg | EXTENDED_RELEASE_TABLET | Freq: Every day | ORAL | Status: DC

## 2013-09-28 NOTE — Telephone Encounter (Signed)
Patient coming in today for an office visit 

## 2013-09-28 NOTE — Patient Instructions (Signed)
Stopped the Adderall  Begin Concerta one daily  If your symptoms persist or the medication doesn't help I would recommend a consult with Dr. Letta Moynahan  Continue the current treatment program for your asthma,,,,,,,,,,,, I feel strongly that you should have a FMLA form

## 2013-09-28 NOTE — Progress Notes (Signed)
Pre visit review using our clinic review tool, if applicable. No additional management support is needed unless otherwise documented below in the visit note. 

## 2013-09-28 NOTE — Progress Notes (Signed)
   Subjective:    Patient ID: Carrie [name], female    DOB: 11-Jan-1961, 53 y.o.   MRN: [id number]  HPI Carrie [name] is a 53 year old married female nonsmoker who comes in today for evaluation 2 problems  She has a history of chronic asthma and and takes her medication faithfully. Despite that she occasionally has a flare. She therefore brings in FMLA form however the questions on the form don't make a lot of sense. She's to work with the HR people to see what we need to do about this form.  She also has history of adult ADD is taken 53 mg of Adderall daily and it doesn't seem to help. She would like to discuss her options   Review of Systems    review of systems otherwise negative Objective:   Physical Exam  Well-developed and nourished female no acute distress vital signs stable she is afebrile      Assessment & Plan:  Impression asthma under good control,,,,,,, chronic,,,,,,, lifelong,,,,,,,,,, patient compliant with medication,,,,,,,,,, I think she deserves an FMLA form  Adult ADD,,,,,,,,,,, trial of Concerta,,,,,,,,

## 2013-10-13 ENCOUNTER — Encounter: Payer: BC Managed Care – PPO | Admitting: Internal Medicine

## 2013-10-31 ENCOUNTER — Other Ambulatory Visit: Payer: Self-pay | Admitting: Family Medicine

## 2014-01-04 ENCOUNTER — Telehealth: Payer: Self-pay | Admitting: Family Medicine

## 2014-01-04 MED ORDER — METHYLPHENIDATE HCL ER (OSM) 54 MG PO TBCR: 54.0000 mg | EXTENDED_RELEASE_TABLET | ORAL | Status: DC

## 2014-01-04 NOTE — Telephone Encounter (Signed)
Pt needs new rx generic concerta 36mg  is not working and per pt md said he will go up to next mg. This med will need another PA

## 2014-01-04 NOTE — Telephone Encounter (Signed)
30 day Rx ready for pick up and patient is aware.

## 2014-01-29 ENCOUNTER — Other Ambulatory Visit: Payer: Self-pay | Admitting: Family Medicine

## 2014-02-04 ENCOUNTER — Telehealth: Payer: Self-pay | Admitting: Family Medicine

## 2014-02-04 MED ORDER — ALBUTEROL SULFATE (2.5 MG/3ML) 0.083% IN NEBU: INHALATION_SOLUTION | RESPIRATORY_TRACT | Status: DC

## 2014-02-04 MED ORDER — ALBUTEROL SULFATE HFA 108 (90 BASE) MCG/ACT IN AERS: 2.0000 | INHALATION_SPRAY | Freq: Four times a day (QID) | RESPIRATORY_TRACT | Status: DC | PRN

## 2014-02-04 NOTE — Telephone Encounter (Signed)
Pt is waiting on mailorder and needs sample of proair/

## 2014-02-17 ENCOUNTER — Telehealth: Payer: Self-pay | Admitting: Family Medicine

## 2014-02-17 MED ORDER — METHYLPHENIDATE HCL 5 MG PO TABS: 5.0000 mg | ORAL_TABLET | Freq: Every day | ORAL | Status: DC

## 2014-02-17 NOTE — Telephone Encounter (Signed)
Pt needs refill on concerta 54 mg however the pt states the med is not last all day please advise.

## 2014-02-17 NOTE — Telephone Encounter (Signed)
Add Ritalin 5 mg take at 3 pm per Dr Sherren Mocha.  Patient is aware.

## 2014-02-21 ENCOUNTER — Telehealth: Payer: Self-pay | Admitting: Family Medicine

## 2014-02-21 MED ORDER — METHYLPHENIDATE HCL ER (OSM) 54 MG PO TBCR: 54.0000 mg | EXTENDED_RELEASE_TABLET | ORAL | Status: DC

## 2014-02-21 NOTE — Telephone Encounter (Signed)
Rx ready for pick up and patient is aware 

## 2014-02-21 NOTE — Telephone Encounter (Signed)
Pt would like new rx concerta. Pt states she only received 1 month of concerta.

## 2014-03-02 ENCOUNTER — Telehealth: Payer: Self-pay | Admitting: Family Medicine

## 2014-03-02 NOTE — Telephone Encounter (Signed)
Pt called to say that the following med  methylphenidate (CONCERTA) 54 MG PO CR tablet make her feel irritable. She said this medicine is not for her. Would like a call back.

## 2014-03-03 NOTE — Telephone Encounter (Signed)
Per Dr Sherren Mocha, patient should call Dr Sim Boast McKinney's office.  Left detailed message on machine for patient.

## 2014-04-29 ENCOUNTER — Other Ambulatory Visit: Payer: Self-pay | Admitting: Family Medicine

## 2014-06-01 ENCOUNTER — Emergency Department (HOSPITAL_BASED_OUTPATIENT_CLINIC_OR_DEPARTMENT_OTHER): Payer: BLUE CROSS/BLUE SHIELD

## 2014-06-01 ENCOUNTER — Other Ambulatory Visit: Payer: Self-pay

## 2014-06-01 ENCOUNTER — Encounter (HOSPITAL_BASED_OUTPATIENT_CLINIC_OR_DEPARTMENT_OTHER): Payer: Self-pay | Admitting: *Deleted

## 2014-06-01 ENCOUNTER — Emergency Department (HOSPITAL_BASED_OUTPATIENT_CLINIC_OR_DEPARTMENT_OTHER)
Admission: EM | Admit: 2014-06-01 | Discharge: 2014-06-01 | Disposition: A | Discharge status: Home / Self Care | Payer: BLUE CROSS/BLUE SHIELD | Attending: Emergency Medicine | Admitting: Emergency Medicine

## 2014-06-01 DIAGNOSIS — J45909 Unspecified asthma, uncomplicated: Secondary | ICD-10-CM | POA: Diagnosis not present

## 2014-06-01 DIAGNOSIS — E669 Obesity, unspecified: Secondary | ICD-10-CM | POA: Diagnosis not present

## 2014-06-01 DIAGNOSIS — R0789 Other chest pain: Secondary | ICD-10-CM | POA: Insufficient documentation

## 2014-06-01 DIAGNOSIS — Z862 Personal history of diseases of the blood and blood-forming organs and certain disorders involving the immune mechanism: Secondary | ICD-10-CM | POA: Diagnosis not present

## 2014-06-01 DIAGNOSIS — Z79899 Other long term (current) drug therapy: Secondary | ICD-10-CM | POA: Insufficient documentation

## 2014-06-01 DIAGNOSIS — Z8659 Personal history of other mental and behavioral disorders: Secondary | ICD-10-CM | POA: Insufficient documentation

## 2014-06-01 DIAGNOSIS — R42 Dizziness and giddiness: Secondary | ICD-10-CM | POA: Insufficient documentation

## 2014-06-01 DIAGNOSIS — Z7952 Long term (current) use of systemic steroids: Secondary | ICD-10-CM | POA: Insufficient documentation

## 2014-06-01 DIAGNOSIS — Z880 Allergy status to penicillin: Secondary | ICD-10-CM | POA: Insufficient documentation

## 2014-06-01 DIAGNOSIS — R079 Chest pain, unspecified: Secondary | ICD-10-CM | POA: Diagnosis present

## 2014-06-01 HISTORY — DX: Unspecified atrial fibrillation: I48.91

## 2014-06-01 LAB — CBC WITH DIFFERENTIAL/PLATELET
Basophils Absolute: 0 10*3/uL (ref 0.0–0.1)
Basophils Relative: 0 % (ref 0–1)
Eosinophils Absolute: 0.2 10*3/uL (ref 0.0–0.7)
Eosinophils Relative: 3 % (ref 0–5)
HCT: 43.5 % (ref 36.0–46.0)
Hemoglobin: 14.2 g/dL (ref 12.0–15.0)
Lymphocytes Relative: 31 % (ref 12–46)
Lymphs Abs: 2.8 10*3/uL (ref 0.7–4.0)
MCH: 30.1 pg (ref 26.0–34.0)
MCHC: 32.6 g/dL (ref 30.0–36.0)
MCV: 92.4 fL (ref 78.0–100.0)
Monocytes Absolute: 0.6 10*3/uL (ref 0.1–1.0)
Monocytes Relative: 6 % (ref 3–12)
Neutro Abs: 5.4 10*3/uL (ref 1.7–7.7)
Neutrophils Relative %: 60 % (ref 43–77)
Platelets: 268 10*3/uL (ref 150–400)
RBC: 4.71 MIL/uL (ref 3.87–5.11)
RDW: 13.7 % (ref 11.5–15.5)
WBC: 9.1 10*3/uL (ref 4.0–10.5)

## 2014-06-01 LAB — BASIC METABOLIC PANEL
Anion gap: 10 (ref 5–15)
BUN: 15 mg/dL (ref 6–23)
CO2: 25 mmol/L (ref 19–32)
Calcium: 9 mg/dL (ref 8.4–10.5)
Chloride: 106 mmol/L (ref 96–112)
Creatinine, Ser: 0.81 mg/dL (ref 0.50–1.10)
GFR calc Af Amer: >90 mL/min (ref >90)
GFR calc non Af Amer: 81 mL/min — ABNORMAL LOW (ref >90)
Glucose, Bld: 158 mg/dL — ABNORMAL HIGH (ref 70–99)
Potassium: 3.2 mmol/L — ABNORMAL LOW (ref 3.5–5.1)
Sodium: 141 mmol/L (ref 135–145)

## 2014-06-01 LAB — TROPONIN I
Troponin I: <0.03 ng/mL (ref <0.031)
Troponin I: <0.03 ng/mL (ref <0.031)

## 2014-06-01 MED ORDER — LORAZEPAM 1 MG PO TABS
1.0000 mg | ORAL_TABLET | Freq: Once | ORAL | Status: AC
  Administered 2014-06-01: 1 mg via ORAL
  Filled 2014-06-01: qty 1

## 2014-06-01 NOTE — ED Notes (Signed)
Pt. returned from XR. 

## 2014-06-01 NOTE — ED Notes (Signed)
Pt reports feeling "heaviness and pressure" in central chest x 2 hours ago. Denies nausea, vomiting, diaphoresis.

## 2014-06-01 NOTE — ED Provider Notes (Signed)
CSN: [id number]     Arrival date & time 06/01/14  1319 History   First MD Initiated Contact with Patient 06/01/14 1329     Chief Complaint  Patient presents with  . Chest Pain     (Consider location/radiation/quality/duration/timing/severity/associated sxs/prior Treatment) HPI Comments: 54 year old female presenting with sudden onset midsternal chest pressure radiating up towards the center of her neck into bilateral shoulders beginning 2-3 hours prior to arrival but she was driving while at work after meeting with an employee. She works in Program[employer], applications. Pain has remained constant, however is starting to ease off over time. At onset, she felt slightly lightheaded which has since subsided. Denies nausea, vomiting or diaphoresis. Denies shortness of breath, cough or fever. Denies ever having symptoms like this in the past. No history of cardiac issues. No family history of early heart disease. Nonsmoker. Denies calf pain or swelling. Admits to being under stress recently with work. States her job is very stressful.  Patient is a 54 y.o. female presenting with chest pain. The history is provided by the patient.  Chest Pain   Past Medical History  Diagnosis Date  . ADD (attention deficit disorder)   . Fibromyalgia   . Obese   . Anemia, iron deficiency   . Asthma   . A-fib    Past Surgical History  Procedure Laterality Date  . Abdominal hysterectomy     Family History  Problem Relation Age of Onset  . Asthma    . Colon cancer    . Lung cancer    . Lung disease     History  Substance Use Topics  . Smoking status: Never Smoker   . Smokeless tobacco: Never Used  . Alcohol Use: 0.5 oz/week    1 Standard drinks or equivalent per week   OB History    No data available     Review of Systems  Cardiovascular: Positive for chest pain.  Neurological: Positive for light-headedness.  All other systems reviewed and are negative.     Allergies  Aspirin; Codeine phosphate; and  Penicillins  Home Medications   Prior to Admission medications   Medication Sig Start Date End Date Taking? Authorizing Provider  albuterol (PROAIR HFA) 108 (90 BASE) MCG/ACT inhaler Inhale 2 puffs into the lungs every 6 (six) hours as needed. 02/04/14   Dorena Cookey, MD  albuterol (PROVENTIL) (2.5 MG/3ML) 0.083% nebulizer solution USE 1 VIAL VIA NEBULIZER EVERY 4 HOURS AS NEEDED FOR WHEEZE/SHORTNESS OF BREATH 02/04/14   Dorena Cookey, MD  amphetamine-dextroamphetamine (ADDERALL) 20 MG tablet Take 1 tablet (20 mg total) by mouth 2 (two) times daily. 07/30/13   Ricard Dillon, MD  hydrochlorothiazide (HYDRODIURIL) 25 MG tablet TAKE 1 TABLET EVERY MORNING 01/31/14   Dorena Cookey, MD  HYDROcodone-acetaminophen [facility]) 10-325 MG per tablet Take 1 tablet by mouth every 8 (eight) hours as needed. 06/03/13   Dorena Cookey, MD  methylphenidate (CONCERTA) 54 MG PO CR tablet Take 1 tablet (54 mg total) by mouth every morning. 02/21/14   Dorena Cookey, MD  methylphenidate (CONCERTA) 54 MG PO CR tablet Take 1 tablet (54 mg total) by mouth every morning. 02/21/14   Dorena Cookey, MD  methylphenidate (CONCERTA) 54 MG PO CR tablet Take 1 tablet (54 mg total) by mouth every morning. 02/21/14   Dorena Cookey, MD  methylphenidate (RITALIN) 5 MG tablet Take 1 tablet (5 mg total) by mouth daily. 02/17/14   Dorena Cookey, MD  methylphenidate (  RITALIN) 5 MG tablet Take 1 tablet (5 mg total) by mouth daily. 02/17/14   Dorena Cookey, MD  methylphenidate (RITALIN) 5 MG tablet Take 1 tablet (5 mg total) by mouth daily. 02/17/14   Dorena Cookey, MD  montelukast (SINGULAIR) 10 MG tablet TAKE 1 TABLET AT BEDTIME    Dorena Cookey, MD  predniSONE (DELTASONE) 10 MG tablet TAKE 1 TABLET DAILY 01/31/14   Dorena Cookey, MD  predniSONE (DELTASONE) 20 MG tablet TAKE AS DIRECTED 11/01/13   Dorena Cookey, MD  predniSONE (DELTASONE) 5 MG tablet TAKE 1 TABLET DAILY    Dorena Cookey, MD  tolterodine (DETROL LA) 4 MG 24 hr capsule  TAKE 1 CAPSULE DAILY 04/29/14   Dorena Cookey, MD   BP 121/54 mmHg  Pulse 81  Temp(Src) 98.3 F (36.8 C) (Oral)  Resp 11  Ht 5' 3.5" (1.613 m)  Wt 192 lb (87.091 kg)  BMI 33.47 kg/m2  SpO2 94% Physical Exam  Constitutional: She is oriented to person, place, and time. She appears well-developed and well-nourished. No distress.  HENT:  Head: Normocephalic and atraumatic.  Mouth/Throat: Oropharynx is clear and moist.  Eyes: Conjunctivae and EOM are normal. Pupils are equal, round, and reactive to light.  Neck: Normal range of motion. Neck supple. No JVD present.  Cardiovascular: Normal rate, regular rhythm, normal heart sounds and intact distal pulses.   No extremity edema.  Pulmonary/Chest: Effort normal and breath sounds normal. No respiratory distress. She exhibits no tenderness.  Abdominal: Soft. Bowel sounds are normal. There is no tenderness.  Musculoskeletal: Normal range of motion. She exhibits no edema.  Neurological: She is alert and oriented to person, place, and time. She has normal strength. No sensory deficit.  Speech fluent, goal oriented. Moves limbs without ataxia. Equal grip strength bilateral.  Skin: Skin is warm and dry. She is not diaphoretic.  Psychiatric: She has a normal mood and affect. Her behavior is normal.  Nursing note and vitals reviewed.   ED Course  Procedures (including critical care time) Labs Review Labs Reviewed  BASIC METABOLIC PANEL - Abnormal; Notable for the following:    Potassium 3.2 (*)    Glucose, Bld 158 (*)    GFR calc non Af Amer 81 (*)    All other components within normal limits  CBC WITH DIFFERENTIAL/PLATELET  TROPONIN I  TROPONIN I    Imaging Review Dg Chest 2 View  06/01/2014   CLINICAL DATA:  Chest pain for 2 hr  EXAM: CHEST  2 VIEW  COMPARISON:  None.  FINDINGS: Normal cardiac silhouette and mediastinal contours given decreased lung volumes. No focal airspace opacities. No pleural effusion or pneumothorax. No evidence  of edema. No acute osseous abnormalities.  IMPRESSION: Hypoventilation without acute cardiopulmonary disease.   Electronically Signed   By: Sandi Mariscal M.D.   On: 06/01/2014 13:56     EKG Interpretation   Date/Time:  Wednesday June 01 2014 13:25:12 EDT Ventricular Rate:  93 PR Interval:  146 QRS Duration: 80 QT Interval:  342 QTC Calculation: 425 R Axis:   34 Text Interpretation:  Normal sinus rhythm Normal ECG No significant change  since No significant change since last tracing Confirmed by WARD,  DO,  KRISTEN ([id number]) on 06/01/2014 1:34:41 PM      MDM   Final diagnoses:  Atypical chest pain   Nontoxic appearing, NAD. AFVSS. Workup negative. Low suspicion for cardiac. Heart score 2. Possibly related to stress. Symptoms significantly improved with  Ativan. Delta troponin negative. Other than age, would PERC negative. Stable for discharge, advised follow-up with PCP by the end of this week or early next week. Return precautions given. Patient states understanding of treatment care plan and is agreeable.  Discussed with attending Dr. Leonides Schanz who agrees with plan of care.   Carman Ching, PA-C 06/01/14 [place], DO 06/07/14 1556

## 2014-06-01 NOTE — ED Notes (Signed)
PA at bedside.

## 2014-06-01 NOTE — Discharge Instructions (Signed)

## 2014-06-01 NOTE — ED Notes (Signed)
Pt reports she has been under a lot of stress recently.

## 2014-06-02 ENCOUNTER — Ambulatory Visit (INDEPENDENT_AMBULATORY_CARE_PROVIDER_SITE_OTHER): Payer: BLUE CROSS/BLUE SHIELD | Admitting: Family Medicine

## 2014-06-02 ENCOUNTER — Encounter: Payer: Self-pay | Admitting: Family Medicine

## 2014-06-02 VITALS — BP 122/94 | HR 93 | Temp 98.6°F | Ht 63.5 in | Wt 204.0 lb

## 2014-06-02 DIAGNOSIS — K449 Diaphragmatic hernia without obstruction or gangrene: Secondary | ICD-10-CM

## 2014-06-02 DIAGNOSIS — F909 Attention-deficit hyperactivity disorder, unspecified type: Secondary | ICD-10-CM

## 2014-06-02 DIAGNOSIS — K219 Gastro-esophageal reflux disease without esophagitis: Secondary | ICD-10-CM

## 2014-06-02 DIAGNOSIS — F988 Other specified behavioral and emotional disorders with onset usually occurring in childhood and adolescence: Secondary | ICD-10-CM

## 2014-06-02 MED ORDER — METHYLPHENIDATE HCL 5 MG PO TABS: 5.0000 mg | ORAL_TABLET | Freq: Every day | ORAL | Status: DC

## 2014-06-02 MED ORDER — AMPHETAMINE-DEXTROAMPHETAMINE 20 MG PO TABS: 20.0000 mg | ORAL_TABLET | Freq: Two times a day (BID) | ORAL | Status: DC

## 2014-06-02 NOTE — Progress Notes (Signed)
   Subjective:    Patient ID: Carrie [name], female    DOB: April 10, 1960, 54 y.o.   MRN: [id number]  HPI  Carrie [name] is a 54 year old female who comes in today for evaluation of reflux esophagitis and hiatal hernia  She was diagnosed in her 31s to have reflux esophagitis with a hiatal hernia. She's been on Prilosec 20 mg OTC for many years. Yesterday around 11 AM she noticed a burning sensation over chest and went up to her right arm. She went next door to the emergency clinic on [facility]. Exam chest x-ray EKG were all normal.  No difficulty swallowing  She is a nonsmoker nondrinker one cup of coffee a day and no peppermint   Review of Systems    review of systems otherwise negative Objective:   Physical Exam  Well-developed well-nourished female no acute distress vital signs stable she's afebrile      Assessment & Plan:  Reflux esophagitis with severe chest pain....... double the Prilosec.Marland Kitchen GI consult to evaluate her esophagus  History of adult ADD would prefer to go back on the Adderall 20 twice a day

## 2014-06-02 NOTE — Patient Instructions (Signed)
Double the Prilosec  I have a call in for GI for further evaluation

## 2014-06-02 NOTE — Progress Notes (Signed)
Pre visit review using our clinic review tool, if applicable. No additional management support is needed unless otherwise documented below in the visit note. 

## 2014-06-10 ENCOUNTER — Other Ambulatory Visit: Payer: Self-pay | Admitting: Family Medicine

## 2014-06-13 ENCOUNTER — Telehealth: Payer: Self-pay | Admitting: Family Medicine

## 2014-06-13 NOTE — Telephone Encounter (Signed)
Pt would like a referral to dr Chalmers Cater for poss DM. Pt's husband sees this dr and she would like to go.

## 2014-06-13 NOTE — Telephone Encounter (Signed)
Left message on machine for patient that there is no diagnosis for diabetes and she will need to be evaluated before referred.

## 2014-06-14 ENCOUNTER — Encounter: Payer: Self-pay | Admitting: Gastroenterology

## 2014-06-14 ENCOUNTER — Ambulatory Visit (INDEPENDENT_AMBULATORY_CARE_PROVIDER_SITE_OTHER): Payer: BLUE CROSS/BLUE SHIELD | Admitting: Gastroenterology

## 2014-06-14 VITALS — BP 112/84 | HR 84 | Ht 63.5 in | Wt 201.0 lb

## 2014-06-14 DIAGNOSIS — Z800 Family history of malignant neoplasm of digestive organs: Secondary | ICD-10-CM

## 2014-06-14 DIAGNOSIS — R0789 Other chest pain: Secondary | ICD-10-CM | POA: Insufficient documentation

## 2014-06-14 MED ORDER — NA SULFATE-K SULFATE-MG SULF 17.5-3.13-1.6 GM/177ML PO SOLN: ORAL | Status: DC

## 2014-06-14 MED ORDER — PANTOPRAZOLE SODIUM 40 MG PO TBEC: 40.0000 mg | DELAYED_RELEASE_TABLET | Freq: Every day | ORAL | Status: DC

## 2014-06-14 NOTE — Patient Instructions (Addendum)
You have been scheduled for an endoscopy and colonoscopy. Please follow the written instructions given to you at your visit today. Please pick up your prep supplies at the pharmacy within the next 1-3 days. If you use inhalers (even only as needed), please bring them with you on the day of your procedure. Your physician has requested that you go to www.startemmi.com and enter the access code given to you at your visit today. This web site gives a general overview about your procedure. However, you should still follow specific instructions given to you by our office regarding your preparation for the procedure.  We have sent the following medications to your pharmacy for you to pick up at your convenience: Pantoprazole  Please stop taking OTC prilosec

## 2014-06-14 NOTE — Progress Notes (Signed)
Reviewed and agree.

## 2014-06-14 NOTE — Progress Notes (Signed)
06/14/2014 Carrie [name] [id number] February 04, 1961   HISTORY OF PRESENT ILLNESS:  This is a 54 year old female who is previously known to Dr. Olevia Perches for colonoscopy in 09/2008 at which time the study was normal.  It was recommended that she have a repeat colonoscopy in 5 years from that time due to family history of colon cancer in her mother.  She had not yet undergone her repeat procedure.  She denies any lower GI issues.  She is here today, however, to discuss some upper GI complaints.  Has been on prilosec 20 mg daily for a while for reflux/heartburn.  Says that she does not feel like it really helps at all.  It was recently increased to twice a day just a couple of weeks ago after she had a visit to the ED for complaints of chest pain.  She says that she only had one episode of this, but the pain radiated from her chest and up into her throat.  Says that ED "ruled out heart attack".  Complains of upper abdominal bloating and discomfort as well.  A lot of belching even first thing in the mornings.  She does take Ibuprofen usually once a day for aches and pains related to her fibromyalgia, etc.  In the ED troponin x 2 were negative.  CBC normal.  BMP ok except for slightly low potassium.   Past Medical History  Diagnosis Date  . ADD (attention deficit disorder)   . Fibromyalgia   . Obese   . Anemia, iron deficiency   . Asthma   . A-fib   . Hiatal hernia    Past Surgical History  Procedure Laterality Date  . Abdominal hysterectomy      reports that she has never smoked. She has never used smokeless tobacco. She reports that she drinks about 0.5 oz of alcohol per week. She reports that she does not use illicit drugs. family history includes Asthma in her mother; Colon cancer in her mother; Diabetes in her sister; Esophageal cancer in her father; Lung cancer in her maternal uncle. Allergies  Allergen Reactions  . Aspirin     REACTION: asthma- on respirator for 28 days  .  Codeine Phosphate     REACTION: hives  . Penicillins     REACTION: wheezing and hives      Outpatient Encounter Prescriptions as of 06/14/2014  Medication Sig  . albuterol (PROAIR HFA) 108 (90 BASE) MCG/ACT inhaler Inhale 2 puffs into the lungs every 6 (six) hours as needed.  Marland Kitchen albuterol (PROVENTIL) (2.5 MG/3ML) 0.083% nebulizer solution USE 1 VIAL VIA NEBULIZER EVERY 4 HOURS AS NEEDED FOR WHEEZE/SHORTNESS OF BREATH  . amphetamine-dextroamphetamine (ADDERALL) 20 MG tablet Take 1 tablet (20 mg total) by mouth 2 (two) times daily.  . hydrochlorothiazide (HYDRODIURIL) 25 MG tablet TAKE 1 TABLET EVERY MORNING  . methylphenidate (RITALIN) 5 MG tablet Take 1 tablet (5 mg total) by mouth daily.  . montelukast (SINGULAIR) 10 MG tablet TAKE 1 TABLET AT BEDTIME  . predniSONE (DELTASONE) 20 MG tablet TAKE AS DIRECTED  . tolterodine (DETROL LA) 4 MG 24 hr capsule TAKE 1 CAPSULE DAILY  . Na Sulfate-K Sulfate-Mg Sulf (SUPREP BOWEL PREP) SOLN Take 1 kit by mouth as directed  . pantoprazole (PROTONIX) 40 MG tablet Take 1 tablet (40 mg total) by mouth daily.  . [DISCONTINUED] amphetamine-dextroamphetamine (ADDERALL) 20 MG tablet Take 1 tablet (20 mg total) by mouth 2 (two) times daily.  . [DISCONTINUED] amphetamine-dextroamphetamine (ADDERALL) 20  MG tablet Take 1 tablet (20 mg total) by mouth 2 (two) times daily.  . [DISCONTINUED] HYDROcodone-acetaminophen (NORCO) 10-325 MG per tablet Take 1 tablet by mouth every 8 (eight) hours as needed.  . [DISCONTINUED] methylphenidate (RITALIN) 5 MG tablet Take 1 tablet (5 mg total) by mouth daily.  . [DISCONTINUED] methylphenidate (RITALIN) 5 MG tablet Take 1 tablet (5 mg total) by mouth daily.     REVIEW OF SYSTEMS  : All other systems reviewed and negative except where noted in the History of Present Illness.   PHYSICAL EXAM: BP 112/84 mmHg  Pulse 84  Ht 5' 3.5" (1.613 m)  Wt 201 lb (91.173 kg)  BMI 35.04 kg/m2 General: Well developed black female in no  acute distress Head: Normocephalic and atraumatic Eyes:  Sclerae anicteric, conjunctiva pink. Ears: Normal auditory acuity Lungs:  Wheezing noted B/L. Heart: Regular rate and rhythm Abdomen: Soft, non-distended.  Normal bowel sounds.  Mild epigastric TTP without R/R/G. Rectal:  Will be done at the time of colonoscopy. Musculoskeletal: Symmetrical with no gross deformities  Skin: No lesions on visible extremities Extremities: No edema  Neurological: Alert oriented x 4, grossly non-focal Psychological:  Alert and cooperative. Normal mood and affect  ASSESSMENT AND PLAN: -Family history of colon cancer in her mother:  Was due for colonoscopy in 09/2013.  Will schedule with Dr. Brodie. -Atypical chest pain with upper abdominal bloating:  Will schedule EGD for evaluation.  ? Reflux vs esophagitis vs esophageal spasm, etc.  Will schedule for EGD for further evaluation.  Will discontinue prilosec and start pantoprazole 40 mg daily for now.    *The risks, benefits, and alternatives to EGD and colonoscopy were discussed with the patient and she consents to proceed.   CC:  Todd, Jeffrey A, MD    

## 2014-06-20 ENCOUNTER — Encounter: Payer: Self-pay | Admitting: Internal Medicine

## 2014-07-19 ENCOUNTER — Other Ambulatory Visit (INDEPENDENT_AMBULATORY_CARE_PROVIDER_SITE_OTHER): Payer: BLUE CROSS/BLUE SHIELD

## 2014-07-19 DIAGNOSIS — Z0000 Encounter for general adult medical examination without abnormal findings: Secondary | ICD-10-CM | POA: Diagnosis not present

## 2014-07-19 LAB — CBC WITH DIFFERENTIAL/PLATELET
Basophils Absolute: 0 10*3/uL (ref 0.0–0.1)
Basophils Relative: 0.5 % (ref 0.0–3.0)
Eosinophils Absolute: 0.1 10*3/uL (ref 0.0–0.7)
Eosinophils Relative: 1.2 % (ref 0.0–5.0)
HCT: 41.3 % (ref 36.0–46.0)
Hemoglobin: 13.8 g/dL (ref 12.0–15.0)
Lymphocytes Relative: 32.7 % (ref 12.0–46.0)
Lymphs Abs: 2.6 10*3/uL (ref 0.7–4.0)
MCHC: 33.5 g/dL (ref 30.0–36.0)
MCV: 88.7 fl (ref 78.0–100.0)
Monocytes Absolute: 0.5 10*3/uL (ref 0.1–1.0)
Monocytes Relative: 6.6 % (ref 3.0–12.0)
Neutro Abs: 4.6 10*3/uL (ref 1.4–7.7)
Neutrophils Relative %: 59 % (ref 43.0–77.0)
Platelets: 306 10*3/uL (ref 150.0–400.0)
RBC: 4.66 Mil/uL (ref 3.87–5.11)
RDW: 14.1 % (ref 11.5–15.5)
WBC: 7.9 10*3/uL (ref 4.0–10.5)

## 2014-07-19 LAB — LIPID PANEL
Cholesterol: 222 mg/dL — ABNORMAL HIGH (ref 0–200)
HDL: 58.1 mg/dL (ref >39.00)
LDL Cholesterol: 151 mg/dL — ABNORMAL HIGH (ref 0–99)
NonHDL: 163.9
Total CHOL/HDL Ratio: 4
Triglycerides: 64 mg/dL (ref 0.0–149.0)
VLDL: 12.8 mg/dL (ref 0.0–40.0)

## 2014-07-19 LAB — POCT URINALYSIS DIPSTICK
Bilirubin, UA: NEGATIVE
Glucose, UA: NEGATIVE
Ketones, UA: NEGATIVE
Nitrite, UA: NEGATIVE
Protein, UA: NEGATIVE
Spec Grav, UA: 1.02
Urobilinogen, UA: 0.2
pH, UA: 6.5

## 2014-07-19 LAB — BASIC METABOLIC PANEL
BUN: 12 mg/dL (ref 6–23)
CO2: 33 mEq/L — ABNORMAL HIGH (ref 19–32)
Calcium: 9.4 mg/dL (ref 8.4–10.5)
Chloride: 105 mEq/L (ref 96–112)
Creatinine, Ser: 0.75 mg/dL (ref 0.40–1.20)
GFR: 103.43 mL/min (ref >60.00)
Glucose, Bld: 84 mg/dL (ref 70–99)
Potassium: 3.2 mEq/L — ABNORMAL LOW (ref 3.5–5.1)
Sodium: 143 mEq/L (ref 135–145)

## 2014-07-19 LAB — HEPATIC FUNCTION PANEL
ALT: 14 U/L (ref 0–35)
AST: 13 U/L (ref 0–37)
Albumin: 3.7 g/dL (ref 3.5–5.2)
Alkaline Phosphatase: 78 U/L (ref 39–117)
Bilirubin, Direct: 0.1 mg/dL (ref 0.0–0.3)
Total Bilirubin: 0.4 mg/dL (ref 0.2–1.2)
Total Protein: 6.7 g/dL (ref 6.0–8.3)

## 2014-07-19 LAB — TSH: TSH: 2.32 u[IU]/mL (ref 0.35–4.50)

## 2014-07-26 ENCOUNTER — Ambulatory Visit (INDEPENDENT_AMBULATORY_CARE_PROVIDER_SITE_OTHER): Payer: BLUE CROSS/BLUE SHIELD | Admitting: Family Medicine

## 2014-07-26 ENCOUNTER — Encounter: Payer: Self-pay | Admitting: Family Medicine

## 2014-07-26 VITALS — BP 110/80 | Temp 98.1°F | Wt 203.0 lb

## 2014-07-26 DIAGNOSIS — Z800 Family history of malignant neoplasm of digestive organs: Secondary | ICD-10-CM

## 2014-07-26 DIAGNOSIS — K449 Diaphragmatic hernia without obstruction or gangrene: Secondary | ICD-10-CM

## 2014-07-26 DIAGNOSIS — F909 Attention-deficit hyperactivity disorder, unspecified type: Secondary | ICD-10-CM

## 2014-07-26 DIAGNOSIS — K219 Gastro-esophageal reflux disease without esophagitis: Secondary | ICD-10-CM | POA: Diagnosis not present

## 2014-07-26 DIAGNOSIS — J4540 Moderate persistent asthma, uncomplicated: Secondary | ICD-10-CM

## 2014-07-26 DIAGNOSIS — R32 Unspecified urinary incontinence: Secondary | ICD-10-CM

## 2014-07-26 DIAGNOSIS — F988 Other specified behavioral and emotional disorders with onset usually occurring in childhood and adolescence: Secondary | ICD-10-CM

## 2014-07-26 MED ORDER — PREDNISONE 20 MG PO TABS: ORAL_TABLET | ORAL | Status: DC

## 2014-07-26 MED ORDER — AMPHETAMINE-DEXTROAMPHETAMINE 20 MG PO TABS: 20.0000 mg | ORAL_TABLET | Freq: Two times a day (BID) | ORAL | Status: DC

## 2014-07-26 MED ORDER — TOLTERODINE TARTRATE ER 4 MG PO CP24: 4.0000 mg | ORAL_CAPSULE | Freq: Every day | ORAL | Status: DC

## 2014-07-26 MED ORDER — METHYLPHENIDATE HCL 5 MG PO TABS: 5.0000 mg | ORAL_TABLET | Freq: Every day | ORAL | Status: DC

## 2014-07-26 MED ORDER — MONTELUKAST SODIUM 10 MG PO TABS: 10.0000 mg | ORAL_TABLET | Freq: Every day | ORAL | Status: DC

## 2014-07-26 MED ORDER — HYDROCHLOROTHIAZIDE 25 MG PO TABS: 25.0000 mg | ORAL_TABLET | Freq: Every morning | ORAL | Status: DC

## 2014-07-26 NOTE — Progress Notes (Signed)
   Subjective:    Patient ID: Carrie [name], female    DOB: [birth year], 54 y.o.   MRN: [id number]  HPI Carrie [name] is a 54 year old married female nonsmoker who comes in today for general physical examination because of a history of asthma  She had childhood asthma and is been hospitalized numerous times in the past 40 years for her asthma. She's been worked up and evaluated by pulmonary allergy immunology and is on the following medications. Singulair 10 mg daily albuterol when necessary and 20 mg of prednisone daily. They've tried all the inhalers and they don't help. She's willing to try to decrease her prednisone by 1 mg every 2 months  She's had severe asthma as a younger oh and actually had to be intubated and on a ventilator twice.  She gets routine eye care, dental care, BSE missed last September advised to go now, regular dental care, colonoscopy every 5 years because her mother had colon cancer.  Vaccinations up-to-date  She'll uterus removed many years ago for nonmalignant reasons therefore pelvic exam not indicated. She still has her ovaries in but GYN wise is asymptomatic. No abdominal bloating etc. etc.  She's not exercising or following a diet weight is up to 203 pounds.   Review of Systems  Constitutional: Negative.   HENT: Negative.   Eyes: Negative.   Respiratory: Negative.   Cardiovascular: Negative.   Gastrointestinal: Negative.   Endocrine: Negative.   Genitourinary: Negative.   Musculoskeletal: Negative.   Skin: Negative.   Allergic/Immunologic: Negative.   Neurological: Negative.   Hematological: Negative.   Psychiatric/Behavioral: Negative.        Objective:   Physical Exam  Constitutional: She appears well-developed and well-nourished.  HENT:  Head: Normocephalic and atraumatic.  Right Ear: External ear normal.  Left Ear: External ear normal.  Nose: Nose normal.  Mouth/Throat: Oropharynx is clear and moist.  Eyes: EOM are normal. Pupils are  equal, round, and reactive to light.  Neck: Normal range of motion. Neck supple. No JVD present. No tracheal deviation present. No thyromegaly present.  Cardiovascular: Normal rate, regular rhythm, normal heart sounds and intact distal pulses.  Exam reveals no gallop and no friction rub.   No murmur heard. Pulmonary/Chest: Effort normal and breath sounds normal. No stridor. No respiratory distress. She has no wheezes. She has no rales. She exhibits no tenderness.  Abdominal: Soft. Bowel sounds are normal. She exhibits no distension and no mass. There is no tenderness. There is no rebound and no guarding.  Genitourinary:  Bilateral breast exam normal  Musculoskeletal: Normal range of motion.  Lymphadenopathy:    She has no cervical adenopathy.  Neurological: She is alert. She has normal reflexes. No cranial nerve deficit. She exhibits normal muscle tone. Coordination normal.  Skin: Skin is warm and dry. No rash noted. No erythema. No pallor.  Psychiatric: She has a normal mood and affect. Her behavior is normal. Judgment and thought content normal.          Assessment & Plan:  Healthy female  Asthma chronic......... history of severe asthma in the past requiring multiple hospitalizations,,,,,,, taper prednisone by 1 mg every 2 months  Urinary incontinence continue Detrol 4 mg daily  Adult ADD continue Adderall 20 mg twice a day  Recent history of reflux esophagitis on proton X 40 mg daily by GI  Overweight,,,,,,,,,,, again stressed diet exercise and weight loss

## 2014-07-26 NOTE — Progress Notes (Signed)
Pre visit review using our clinic review tool, if applicable. No additional management support is needed unless otherwise documented below in the visit note. 

## 2014-07-26 NOTE — Patient Instructions (Signed)
Continue current medications  Call 2 weeks from being out of your third prescription for refills on your Adderall  Start a diet and exercise program we discussed  Set up for your mammogram  Follow-up in one year sooner if any problems

## 2014-07-27 ENCOUNTER — Other Ambulatory Visit: Payer: Self-pay | Admitting: Internal Medicine

## 2014-07-27 ENCOUNTER — Ambulatory Visit (AMBULATORY_SURGERY_CENTER): Payer: BLUE CROSS/BLUE SHIELD | Admitting: Internal Medicine

## 2014-07-27 ENCOUNTER — Ambulatory Visit (INDEPENDENT_AMBULATORY_CARE_PROVIDER_SITE_OTHER)
Admission: RE | Admit: 2014-07-27 | Discharge: 2014-07-27 | Disposition: A | Discharge status: Home / Self Care | Payer: BLUE CROSS/BLUE SHIELD | Source: Ambulatory Visit | Attending: Family Medicine | Admitting: Family Medicine

## 2014-07-27 ENCOUNTER — Encounter: Payer: Self-pay | Admitting: Internal Medicine

## 2014-07-27 VITALS — BP 98/66 | HR 74 | Temp 98.0°F | Resp 30 | Ht 63.0 in | Wt 201.0 lb

## 2014-07-27 DIAGNOSIS — K635 Polyp of colon: Secondary | ICD-10-CM

## 2014-07-27 DIAGNOSIS — K209 Esophagitis, unspecified without bleeding: Secondary | ICD-10-CM

## 2014-07-27 DIAGNOSIS — D125 Benign neoplasm of sigmoid colon: Secondary | ICD-10-CM

## 2014-07-27 DIAGNOSIS — Z7952 Long term (current) use of systemic steroids: Secondary | ICD-10-CM

## 2014-07-27 DIAGNOSIS — Z800 Family history of malignant neoplasm of digestive organs: Secondary | ICD-10-CM | POA: Diagnosis not present

## 2014-07-27 DIAGNOSIS — Z1211 Encounter for screening for malignant neoplasm of colon: Secondary | ICD-10-CM | POA: Diagnosis not present

## 2014-07-27 DIAGNOSIS — J4540 Moderate persistent asthma, uncomplicated: Secondary | ICD-10-CM

## 2014-07-27 DIAGNOSIS — R0789 Other chest pain: Secondary | ICD-10-CM

## 2014-07-27 MED ORDER — SODIUM CHLORIDE 0.9 % IV SOLN: 500.0000 mL | INTRAVENOUS | Status: DC

## 2014-07-27 NOTE — Progress Notes (Signed)
Waiting for Dr. Olevia Perches

## 2014-07-27 NOTE — Patient Instructions (Signed)
Discharge instructions given. Handouts on polyps,diverticulosis and a hiatal hernia. Resume previous medications. YOU HAD AN ENDOSCOPIC PROCEDURE TODAY AT [facility]:   Refer to the procedure report that was given to you for any specific questions about what was found during the examination.  If the procedure report does not answer your questions, please call your gastroenterologist to clarify.  If you requested that your care partner not be given the details of your procedure findings, then the procedure report has been included in a sealed envelope for you to review at your convenience later.  YOU SHOULD EXPECT: Some feelings of bloating in the abdomen. Passage of more gas than usual.  Walking can help get rid of the air that was put into your GI tract during the procedure and reduce the bloating. If you had a lower endoscopy (such as a colonoscopy or flexible sigmoidoscopy) you may notice spotting of blood in your stool or on the toilet paper. If you underwent a bowel prep for your procedure, you may not have a normal bowel movement for a few days.  Please Note:  You might notice some irritation and congestion in your nose or some drainage.  This is from the oxygen used during your procedure.  There is no need for concern and it should clear up in a day or so.  SYMPTOMS TO REPORT IMMEDIATELY:   Following lower endoscopy (colonoscopy or flexible sigmoidoscopy):  Excessive amounts of blood in the stool  Significant tenderness or worsening of abdominal pains  Swelling of the abdomen that is new, acute  Fever of 100F or higher   Following upper endoscopy (EGD)  Vomiting of blood or coffee ground material  New chest pain or pain under the shoulder blades  Painful or persistently difficult swallowing  New shortness of breath  Fever of 100F or higher  Black, tarry-looking stools  For urgent or emergent issues, a gastroenterologist can be reached at any hour by calling [phone]  [phone].   DIET: Your first meal following the procedure should be a small meal and then it is ok to progress to your normal diet. Heavy or fried foods are harder to digest and may make you feel nauseous or bloated.  Likewise, meals heavy in dairy and vegetables can increase bloating.  Drink plenty of fluids but you should avoid alcoholic beverages for 24 hours.  ACTIVITY:  You should plan to take it easy for the rest of today and you should NOT DRIVE or use heavy machinery until tomorrow (because of the sedation medicines used during the test).    FOLLOW UP: Our staff will call the number listed on your records the next business day following your procedure to check on you and address any questions or concerns that you may have regarding the information given to you following your procedure. If we do not reach you, we will leave a message.  However, if you are feeling well and you are not experiencing any problems, there is no need to return our call.  We will assume that you have returned to your regular daily activities without incident.  If any biopsies were taken you will be contacted by phone or by letter within the next 1-3 weeks.  Please call us at [phone] if you have not heard about the biopsies in 3 weeks.    SIGNATURES/CONFIDENTIALITY: You and/or your care partner have signed paperwork which will be entered into your electronic medical record.  These signatures attest to the fact  that that the information above on your After Visit Summary has been reviewed and is understood.  Full responsibility of the confidentiality of this discharge information lies with you and/or your care-partner.

## 2014-07-27 NOTE — Progress Notes (Signed)
Called to room to assist during endoscopic procedure.  Patient ID and intended procedure confirmed with present staff. Received instructions for my participation in the procedure from the performing physician.  

## 2014-07-27 NOTE — Progress Notes (Signed)
A/ox3, pleased with MAC, report to RN 

## 2014-07-27 NOTE — Op Note (Signed)
[facility]  [address] [place], [address]   COLONOSCOPY PROCEDURE REPORT  PATIENT: Carrie [name], Carrie [name]  MR#: #[id number] BIRTHDATE: August 21, 1960 , 18  yrs. old GENDER: female ENDOSCOPIST: Lafayette Dragon, MD REFERRED OI:[id number] Delora Fuel, M.D. PROCEDURE DATE:  07/27/2014 PROCEDURE:   Colonoscopy, screening and Colonoscopy with cold biopsy polypectomy First Screening Colonoscopy - Avg.  risk and is 50 yrs.  old or older - No.  Prior Negative Screening - Now for repeat screening. Less than 10 yrs Prior Negative Screening - Now for repeat screening.  Above average risk  History of Adenoma - Now for follow-up colonoscopy & has been > or = to 3 yrs.  N/A  Polyps Removed Today ASA CLASS:   Class II INDICATIONS:positive family history of colon cancer in patient's mother.  Last colonoscopy July 2010 and Screening for colonic neoplasia. MEDICATIONS: Monitored anesthesia care and Propofol 290 mg IV  DESCRIPTION OF PROCEDURE:   After the risks benefits and alternatives of the procedure were thoroughly explained, informed consent was obtained.  The digital rectal exam revealed no abnormalities of the rectum.   The LB [id number]  endoscope was introduced through the anus and advanced to the cecum, which was identified by both the appendix and ileocecal valve. No adverse events experienced.   The quality of the prep was good.  (MoviPrep was used)  The instrument was then slowly withdrawn as the colon was fully examined.      COLON FINDINGS: Two sessile polyps measuring 4 mm in size were found in the sigmoid colon.  A polypectomy was performed with cold forceps.  The resection was complete, the polyp tissue was completely retrieved and sent to histology.   There was mild diverticulosis noted in the sigmoid colon.  Retroflexed views revealed no abnormalities. The time to cecum = 8.10 Withdrawal time = 6.02   The scope was withdrawn and the procedure  completed. COMPLICATIONS: There were no immediate complications.  ENDOSCOPIC IMPRESSION: 1.   Two sessile polyps were found in the sigmoid colon; polypectomy was performed with cold forceps 2.   Mild diverticulosis was noted in the sigmoid colon  RECOMMENDATIONS: 1.  Await pathology results 2.  High-fiber diet Recall colonoscopy in 5 years  eSigned:  Lafayette Dragon, MD 2014-07-27 64:15:83.094   cc:

## 2014-07-27 NOTE — Op Note (Signed)
[facility]  [address]. [place], [address]   ENDOSCOPY PROCEDURE REPORT  PATIENT: Carrie, [name]  MR#: #[id number] BIRTHDATE: [birth year] , 50  yrs. old GENDER: female ENDOSCOPIST: Lafayette Dragon, MD REFERRED BY:  Dorena Cookey, M.D. PROCEDURE DATE:  07/27/2014 PROCEDURE:  EGD w/ biopsy ASA CLASS:     Class II INDICATIONS:  Noncardiac chest pain relieved by Prilosec 20 mg twice a day. MEDICATIONS: Monitored anesthesia care and Propofol 160 mg IV TOPICAL ANESTHETIC: none  DESCRIPTION OF PROCEDURE: After the risks benefits and alternatives of the procedure were thoroughly explained, informed consent was obtained.  The LB [id number] endoscope was introduced through the mouth and advanced to the second portion of the duodenum , Without limitations.  The instrument was slowly withdrawn as the mucosa was fully examined.    Esophagus: proximal and mid esophageal mucosa was normal. There were 2 long erosions at the GE junction each measured about 2 cm in length and were consistent with grade B esophagitis. There was mild increase in fibrous tissue at  Stoma GE junction but no strictureCH:  There was a 5 cm hiatal hernia extending from 30-35 cm from the incisors. There were no Cameron erosions. Gastric body and gastric antrum was normal including pyloric outlet. Retroflexion of the endoscope confirmed presence of hiatal herniaDuodenum: duodenal bulb and descending duodenum was normal[          The scope was then withdrawn from the patient and the procedure completed.  COMPLICATIONS: There were no immediate complications.  ENDOSCOPIC IMPRESSION: 1.  grade B esophagitis ,status post biopsies 2. Moderate to large nonreducible hiatal hernia may be possible cause of noncardiac chest pain  RECOMMENDATIONS: 1.  Anti-reflux regimen to be follow 2.  Await pathology results 3. continue PPI REPEAT EXAM: for EGD pending biopsy results.  eSigned:   Lafayette Dragon, MD 2014-07-27 [phone]    CC:  PATIENT NAME:  Carrie, [name] MR#: #[id number]

## 2014-07-28 ENCOUNTER — Telehealth: Payer: Self-pay

## 2014-07-28 NOTE — Telephone Encounter (Signed)
No answer, left voicemail message.

## 2014-08-06 ENCOUNTER — Encounter: Payer: Self-pay | Admitting: Internal Medicine

## 2014-08-29 ENCOUNTER — Encounter: Payer: Self-pay | Admitting: Internal Medicine

## 2014-09-28 ENCOUNTER — Other Ambulatory Visit: Payer: Self-pay

## 2014-09-28 MED ORDER — PANTOPRAZOLE SODIUM 40 MG PO TBEC: 40.0000 mg | DELAYED_RELEASE_TABLET | Freq: Every day | ORAL | Status: DC

## 2014-10-04 ENCOUNTER — Telehealth: Payer: Self-pay | Admitting: Family Medicine

## 2014-10-04 NOTE — Telephone Encounter (Signed)
Pt needs new rxs ritalin 5 mg and generic adderall 20 mg

## 2014-10-05 MED ORDER — METHYLPHENIDATE HCL 5 MG PO TABS: 5.0000 mg | ORAL_TABLET | Freq: Every day | ORAL | Status: DC

## 2014-10-05 MED ORDER — AMPHETAMINE-DEXTROAMPHETAMINE 20 MG PO TABS: 20.0000 mg | ORAL_TABLET | Freq: Two times a day (BID) | ORAL | Status: DC

## 2014-10-05 NOTE — Telephone Encounter (Signed)
Rx ready for pick up and patient is aware 

## 2014-11-26 ENCOUNTER — Other Ambulatory Visit: Payer: Self-pay | Admitting: Family Medicine

## 2015-02-13 ENCOUNTER — Telehealth: Payer: Self-pay | Admitting: Family Medicine

## 2015-02-13 MED ORDER — METHYLPHENIDATE HCL 5 MG PO TABS: 5.0000 mg | ORAL_TABLET | Freq: Every day | ORAL | Status: DC

## 2015-02-13 MED ORDER — AMPHETAMINE-DEXTROAMPHETAMINE 20 MG PO TABS: 20.0000 mg | ORAL_TABLET | Freq: Two times a day (BID) | ORAL | Status: DC

## 2015-02-13 NOTE — Telephone Encounter (Signed)
Left message on machine for patient Rx ready for pick up 

## 2015-02-13 NOTE — Telephone Encounter (Signed)
Pt requesting refill of the following:  amphetamine-dextroamphetamine (ADDERALL) 20 MG tablet methylphenidate (RITALIN) 5 MG tablet

## 2015-02-15 ENCOUNTER — Other Ambulatory Visit: Payer: Self-pay | Admitting: Family Medicine

## 2015-02-15 MED ORDER — ALBUTEROL SULFATE HFA 108 (90 BASE) MCG/ACT IN AERS: 2.0000 | INHALATION_SPRAY | Freq: Four times a day (QID) | RESPIRATORY_TRACT | Status: DC | PRN

## 2015-02-24 ENCOUNTER — Other Ambulatory Visit: Payer: Self-pay | Admitting: Family Medicine

## 2015-02-24 ENCOUNTER — Telehealth: Payer: Self-pay | Admitting: Family Medicine

## 2015-02-24 NOTE — Telephone Encounter (Signed)
Carrie [name] called saying she's having sinus problems x's 3days and she's never had them before. The symptoms are getting worse and she's wondering if she can be worked in today. Please give the pt a call regarding this.  Pt's ph# [phone] Thank you.

## 2015-02-24 NOTE — Telephone Encounter (Signed)
Pt has been sch

## 2015-02-24 NOTE — Telephone Encounter (Signed)
Spoke with patient and she would like to go to the Saturday clinic.  Please call her this afternoon to schedule. thanks

## 2015-02-25 ENCOUNTER — Ambulatory Visit (INDEPENDENT_AMBULATORY_CARE_PROVIDER_SITE_OTHER): Payer: BLUE CROSS/BLUE SHIELD | Admitting: Family Medicine

## 2015-02-25 ENCOUNTER — Encounter: Payer: Self-pay | Admitting: Family Medicine

## 2015-02-25 VITALS — BP 114/82 | HR 88 | Temp 98.1°F | Wt 207.0 lb

## 2015-02-25 DIAGNOSIS — J0100 Acute maxillary sinusitis, unspecified: Secondary | ICD-10-CM | POA: Insufficient documentation

## 2015-02-25 MED ORDER — DOXYCYCLINE HYCLATE 100 MG PO TABS: 100.0000 mg | ORAL_TABLET | Freq: Two times a day (BID) | ORAL | Status: DC

## 2015-02-25 NOTE — Patient Instructions (Signed)
Nice to meet you. You have a sinus infection. We will treat you with doxycycline for this. If you develop worsening headache, numbness, weakness, vision changes, fevers, worsening symptoms, chills, or any new or changing symptoms please seek medical attention.

## 2015-02-25 NOTE — Progress Notes (Signed)
Pre visit review using our clinic review tool, if applicable. No additional management support is needed unless otherwise documented below in the visit note. 

## 2015-02-25 NOTE — Assessment & Plan Note (Addendum)
This is a new issue. Symptoms and duration with waxing and waning course consistent with bacterial sinus infection. She is neurologically intact. Vision is normal on exam today. Vital signs are stable. We will treat with doxycycline given her penicillin allergy. She's given return precautions.

## 2015-02-25 NOTE — Progress Notes (Signed)
  Tommi Rumps, MD Phone: [phone]  Carrie [name] is a 54 y.o. female who presents today for same-day appointment.  Sinus Infection: Patient notes 3 weeks of right maxillary and frontal sinus pressure with brown, green, yellowish mucus out of her nose. She notes she is feeling mildly improved, though symptoms have persisted. She denies fevers, numbness, and weakness. She notes her right eye did feel puffy and maybe had some blurry vision with this in the middle of the course of the illness though this has resolved and has not recurred. She notes no eyelid swelling. Her husband was sick with a cough recently. She's been using nasal saline and Afrin with some benefit.  PMH: nonsmoker.   ROS see history of present illness   Objective  Physical Exam Filed Vitals:   02/25/15 0956  BP: 114/82  Pulse: 88  Temp: 98.1 F (36.7 C)    BP Readings from Last 3 Encounters:  02/25/15 114/82  07/27/14 98/66  07/26/14 110/80   Wt Readings from Last 3 Encounters:  02/25/15 207 lb (93.895 kg)  07/27/14 201 lb (91.173 kg)  07/26/14 203 lb (92.08 kg)    Physical Exam  Constitutional: She is well-developed, well-nourished, and in no distress.  HENT:  Head: Normocephalic and atraumatic.  Right Ear: External ear normal.  Left Ear: External ear normal.  Mouth/Throat: Oropharynx is clear and moist. No oropharyngeal exudate.  No tenderness to percussion of bilateral sinuses  Eyes: Conjunctivae and EOM are normal. Pupils are equal, round, and reactive to light. Right eye exhibits no discharge. Left eye exhibits no discharge.  Neck: Normal range of motion. Neck supple.  Cardiovascular: Normal rate, regular rhythm and normal heart sounds.   Pulmonary/Chest: Effort normal and breath sounds normal. No respiratory distress.  Lymphadenopathy:    She has no cervical adenopathy.  Neurological: She is alert.  CN 2-12 intact, 5/5 strength in bilateral biceps, triceps, grip, quads,  hamstrings, plantar and dorsiflexion, sensation to light touch intact in bilateral UE and LE, normal gait, 2+ patellar reflexes  Skin: Skin is warm and dry. She is not diaphoretic.     Assessment/Plan: Please see individual problem list.  Sinusitis, acute maxillary This is a new issue. Symptoms and duration with waxing and waning course consistent with bacterial sinus infection. She is neurologically intact. Vision is normal on exam today. Vital signs are stable. We will treat with doxycycline given her penicillin allergy. She's given return precautions.    Meds ordered this encounter  Medications  . doxycycline (VIBRA-TABS) 100 MG tablet    Sig: Take 1 tablet (100 mg total) by mouth 2 (two) times daily.    Dispense:  14 tablet    Refill:  0    Dragon voice recognition software was used during the dictation process of this note. If any phrases or words seem inappropriate it is likely secondary to the translation process being inefficient.  Tommi Rumps

## 2015-06-22 ENCOUNTER — Telehealth: Payer: Self-pay | Admitting: Family Medicine

## 2015-06-22 NOTE — Telephone Encounter (Signed)
Pt needs new rxs generic adderall 20 mg and ritalin 5 mg

## 2015-06-26 MED ORDER — METHYLPHENIDATE HCL 5 MG PO TABS: 5.0000 mg | ORAL_TABLET | Freq: Every day | ORAL | Status: DC

## 2015-06-26 MED ORDER — AMPHETAMINE-DEXTROAMPHETAMINE 20 MG PO TABS: 20.0000 mg | ORAL_TABLET | Freq: Two times a day (BID) | ORAL | Status: DC

## 2015-06-26 NOTE — Telephone Encounter (Signed)
rx ready for pick up and Left message on machine for patient 

## 2015-07-06 ENCOUNTER — Telehealth: Payer: Self-pay | Admitting: Family Medicine

## 2015-07-06 NOTE — Telephone Encounter (Signed)
Pt asked if you give her a call

## 2015-07-07 MED ORDER — PREDNISONE 10 MG PO TABS: 10.0000 mg | ORAL_TABLET | Freq: Every day | ORAL | Status: DC

## 2015-07-07 MED ORDER — PREDNISONE 20 MG PO TABS: ORAL_TABLET | ORAL | Status: DC

## 2015-07-07 NOTE — Telephone Encounter (Signed)
Spoke with patient and refills sent 

## 2015-08-21 ENCOUNTER — Other Ambulatory Visit: Payer: Self-pay | Admitting: Family Medicine

## 2015-10-16 ENCOUNTER — Telehealth: Payer: Self-pay | Admitting: Emergency Medicine

## 2015-10-16 ENCOUNTER — Encounter (INDEPENDENT_AMBULATORY_CARE_PROVIDER_SITE_OTHER): Payer: Self-pay

## 2015-10-16 ENCOUNTER — Other Ambulatory Visit (INDEPENDENT_AMBULATORY_CARE_PROVIDER_SITE_OTHER): Payer: Managed Care, Other (non HMO)

## 2015-10-16 ENCOUNTER — Other Ambulatory Visit: Payer: Self-pay | Admitting: Family Medicine

## 2015-10-16 ENCOUNTER — Ambulatory Visit (INDEPENDENT_AMBULATORY_CARE_PROVIDER_SITE_OTHER): Payer: Managed Care, Other (non HMO) | Admitting: Family Medicine

## 2015-10-16 ENCOUNTER — Encounter: Payer: Self-pay | Admitting: Family Medicine

## 2015-10-16 VITALS — BP 128/72 | HR 97 | Temp 98.1°F | Ht 63.0 in | Wt 212.3 lb

## 2015-10-16 DIAGNOSIS — E876 Hypokalemia: Secondary | ICD-10-CM

## 2015-10-16 DIAGNOSIS — I519 Heart disease, unspecified: Principal | ICD-10-CM

## 2015-10-16 DIAGNOSIS — R5383 Other fatigue: Secondary | ICD-10-CM

## 2015-10-16 DIAGNOSIS — E039 Hypothyroidism, unspecified: Secondary | ICD-10-CM

## 2015-10-16 DIAGNOSIS — R7309 Other abnormal glucose: Secondary | ICD-10-CM

## 2015-10-16 LAB — HEPATIC FUNCTION PANEL
ALT: 16 U/L (ref 0–35)
AST: 17 U/L (ref 0–37)
Albumin: 3.9 g/dL (ref 3.5–5.2)
Alkaline Phosphatase: 68 U/L (ref 39–117)
Bilirubin, Direct: 0 mg/dL (ref 0.0–0.3)
Total Bilirubin: 0.4 mg/dL (ref 0.2–1.2)
Total Protein: 6.6 g/dL (ref 6.0–8.3)

## 2015-10-16 LAB — CBC WITH DIFFERENTIAL/PLATELET
Basophils Absolute: 0 10*3/uL (ref 0.0–0.1)
Basophils Relative: 0.8 % (ref 0.0–3.0)
Eosinophils Absolute: 0.2 10*3/uL (ref 0.0–0.7)
Eosinophils Relative: 3 % (ref 0.0–5.0)
HCT: 42 % (ref 36.0–46.0)
Hemoglobin: 13.9 g/dL (ref 12.0–15.0)
Lymphocytes Relative: 34.4 % (ref 12.0–46.0)
Lymphs Abs: 2 10*3/uL (ref 0.7–4.0)
MCHC: 33.1 g/dL (ref 30.0–36.0)
MCV: 87.8 fl (ref 78.0–100.0)
Monocytes Absolute: 0.5 10*3/uL (ref 0.1–1.0)
Monocytes Relative: 8.5 % (ref 3.0–12.0)
Neutro Abs: 3.2 10*3/uL (ref 1.4–7.7)
Neutrophils Relative %: 53.3 % (ref 43.0–77.0)
Platelets: 307 10*3/uL (ref 150.0–400.0)
RBC: 4.78 Mil/uL (ref 3.87–5.11)
RDW: 14.8 % (ref 11.5–15.5)
WBC: 5.9 10*3/uL (ref 4.0–10.5)

## 2015-10-16 LAB — BASIC METABOLIC PANEL
BUN: 12 mg/dL (ref 6–23)
CO2: 32 mEq/L (ref 19–32)
Calcium: 9.4 mg/dL (ref 8.4–10.5)
Chloride: 105 mEq/L (ref 96–112)
Creatinine, Ser: 0.68 mg/dL (ref 0.40–1.20)
GFR: 115.29 mL/min (ref >60.00)
Glucose, Bld: 90 mg/dL (ref 70–99)
Potassium: 3.4 mEq/L — ABNORMAL LOW (ref 3.5–5.1)
Sodium: 144 mEq/L (ref 135–145)

## 2015-10-16 LAB — HEMOGLOBIN A1C: Hgb A1c MFr Bld: 6.7 % — ABNORMAL HIGH (ref 4.6–6.5)

## 2015-10-16 LAB — TSH: TSH: 2.4 u[IU]/mL (ref 0.35–4.50)

## 2015-10-16 NOTE — Patient Instructions (Signed)
We have ordered labs or studies at this visit. It can take up to 1-2 weeks for results and processing. IF results require follow up or explanation, we will call you with instructions. Clinically stable results will be released to your [facility]. If you have not heard from [place] or cannot find your results in [facility] in 2 weeks please contact our office at [phone].  If you are not yet signed up for [facility], please consider signing up   Basic Carbohydrate Counting for Diabetes Mellitus Carbohydrate counting is a method for keeping track of the amount of carbohydrates you eat. Eating carbohydrates naturally increases the level of sugar (glucose) in your blood, so it is important for you to know the amount that is okay for you to have in every meal. Carbohydrate counting helps keep the level of glucose in your blood within normal limits. The amount of carbohydrates allowed is different for every person. A dietitian can help you calculate the amount that is right for you. Once you know the amount of carbohydrates you can have, you can count the carbohydrates in the foods you want to eat. Carbohydrates are found in the following foods:  Grains, such as breads and cereals.  Dried beans and soy products.  Starchy vegetables, such as potatoes, peas, and corn.  Fruit and fruit juices.  Milk and yogurt.  Sweets and snack foods, such as cake, cookies, candy, chips, soft drinks, and fruit drinks. CARBOHYDRATE COUNTING There are two ways to count the carbohydrates in your food. You can use either of the methods or a combination of both. Reading the "Nutrition Facts" on [place] The "Nutrition Facts" is an area that is included on the labels of almost all packaged food and beverages in the [place]. It includes the serving size of that food or beverage and information about the nutrients in each serving of the food, including the grams (g) of carbohydrate per serving.  Decide the number of servings  of this food or beverage that you will be able to eat or drink. Multiply that number of servings by the number of grams of carbohydrate that is listed on the label for that serving. The total will be the amount of carbohydrates you will be having when you eat or drink this food or beverage. Learning Standard Serving Sizes of Food When you eat food that is not packaged or does not include "Nutrition Facts" on the label, you need to measure the servings in order to count the amount of carbohydrates.A serving of most carbohydrate-rich foods contains about 15 g of carbohydrates. The following list includes serving sizes of carbohydrate-rich foods that provide 15 g ofcarbohydrate per serving:   1 slice of bread (1 oz) or 1 six-inch tortilla.    of a hamburger bun or English muffin.  4-6 crackers.   cup unsweetened dry cereal.    cup hot cereal.   cup rice or pasta.    cup mashed potatoes or  of a large baked potato.  1 cup fresh fruit or one small piece of fruit.    cup canned or frozen fruit or fruit juice.  1 cup milk.   cup plain fat-free yogurt or yogurt sweetened with artificial sweeteners.   cup cooked dried beans or starchy vegetable, such as peas, corn, or potatoes.  Decide the number of standard-size servings that you will eat. Multiply that number of servings by 15 (the grams of carbohydrates in that serving). For example, if you eat 2 cups of  strawberries, you will have eaten 2 servings and 30 g of carbohydrates (2 servings x 15 g = 30 g). For foods such as soups and casseroles, in which more than one food is mixed in, you will need to count the carbohydrates in each food that is included. EXAMPLE OF CARBOHYDRATE COUNTING Sample Dinner  3 oz chicken breast.   cup of brown rice.   cup of corn.  1 cup milk.   1 cup strawberries with sugar-free whipped topping.  Carbohydrate Calculation Step 1: Identify the foods that contain carbohydrates:   Rice.    Corn.   Milk.   Strawberries. Step 2:Calculate the number of servings eaten of each:   2 servings of rice.   1 serving of corn.   1 serving of milk.   1 serving of strawberries. Step 3: Multiply each of those number of servings by 15 g:   2 servings of rice x 15 g = 30 g.   1 serving of corn x 15 g = 15 g.   1 serving of milk x 15 g = 15 g.   1 serving of strawberries x 15 g = 15 g. Step 4: Add together all of the amounts to find the total grams of carbohydrates eaten: 30 g + 15 g + 15 g + 15 g = 75 g.   This information is not intended to replace advice given to you by your health care provider. Make sure you discuss any questions you have with your health care provider.   Document Released: 03/04/2005 Document Revised: 03/25/2014 Document Reviewed: 01/29/2013 Elsevier Interactive Patient Education [employer].

## 2015-10-16 NOTE — Telephone Encounter (Signed)
Last filled on 06/26/15, last time she saw you was on  07/26/14 ok to refill?

## 2015-10-16 NOTE — Progress Notes (Signed)
Pre visit review using our clinic review tool, if applicable. No additional management support is needed unless otherwise documented below in the visit note. 

## 2015-10-16 NOTE — Telephone Encounter (Signed)
Patient requesting refill on Adderral medication. Patient previously contacted Dr. Honor Junes CMA about refill. Patient seen today by Gregary Signs, NP and was made aware she does not refill the medication without haven seen the patient or assessed symptoms.

## 2015-10-16 NOTE — Progress Notes (Signed)
Subjective:    Patient ID: Carrie [name], female    DOB: Aug 25, 1960, 55 y.o.   MRN: [id number]  HPI  Carrie [name] is a 55 year old female who presents today for "not feeling well".  She reports coming to the clinic concerned about her glucose levels that she has checked with her husband's glucometer and she is fatigued over the past 2 months.  She reports her fasting levels range from 101-139 and she would like to have this checked so she "can stay on top of this".   Fatigue: Patient complains of fatigue. Symptoms began about 2 months ago. [place] symptom the patient feels fatigue began with not resting much at night and increased work load.. Symptoms of her fatigue have been increased with a greater work load and concern regarding her "sugar level" as her husband was recently diagnosed with diabetes.. Patient describes the following psychologic symptoms of  waking up at night due to increased work load and stress at work.  Patient denies chest pain, palpitations, SOB, weakness, numbness, lightheadedness, dizziness, or any bleeding abnormalities.. Symptoms have remained consistent as her workload has increased. .   Fasting or morning glucose range average is not monitored. She reports her fasting glucose over the past week has ranged from 101-139. Highest glucose 2 hours after any meal is not monitored. No hypoglycemia reported                                                                                                                 No regular exercise described as  times per week for minutes:  She denies exercise at home. She reports working long hours. No specific nutrition/diet followed:  She reports no specific diet and states that she does eat fast food due to her work travel. She reports eating a high level of carbohydrates. Medication compliance is good:  She has a history of prednisone 20 mg daily for "years".  She states that if not she will have an exacerbation of  asthma. No medication adverse effects noted. Eye exam current:  Yearly. Recent new prescription has been obtained.  No excess thirst ;  excess hunger ; or excess urination reported:  She reports urinating "quite a bit" but denies this as a change.  She is currently taking Detrol LA for urinary frequency/urgency.                           No lightheadedness with standing reported No chest pain ; palpitations ; or claudication described .  No non healing skin  ulcers or sores of extremities noted. No numbness or tingling or burning in feet described                                                                                                                                             No significant change in weight of pound gain pound loss:  Reports gradual increase due to increased sitting/eating while at work. No blurred,double, or loss of vision reported.  Filed Weights   10/16/15 0841  Weight: 212 lb 4.8 oz (96.3 kg)    Review of Systems  Constitutional: Positive for fatigue. Negative for chills and fever.  HENT: Negative for nosebleeds, rhinorrhea, sinus pressure and sneezing.   Eyes: Negative for visual disturbance.  Respiratory: Negative for cough, shortness of breath and wheezing.   Cardiovascular: Negative for chest pain, palpitations and leg swelling.  Gastrointestinal: Negative for abdominal pain, constipation, diarrhea, nausea and vomiting.  Genitourinary: Negative for dysuria, frequency, hematuria and urgency.  Musculoskeletal: Negative for joint swelling and myalgias.  Skin: Negative for rash.  Neurological: Negative for dizziness, weakness, light-headedness, numbness and headaches.  Hematological: Does not bruise/bleed easily.  Psychiatric/Behavioral:       Denies depressed or anxious mood   Past Medical History:  Diagnosis Date  . A-fib ([place])     . ADD (attention deficit disorder)   . Anemia, iron deficiency   . Asthma   . Fibromyalgia   . Hiatal hernia   . Obese      Social History   Social History  . Marital status: Married    Spouse name: N/A  . Number of children: 4  . Years of education: N/A   Occupational History  . human resources    Social History Main Topics  . Smoking status: Never Smoker  . Smokeless tobacco: Never Used  . Alcohol use 0.5 oz/week    1 Standard drinks or equivalent per week  . Drug use: No  . Sexual activity: Not on file   Other Topics Concern  . Not on file   Social History Narrative   Occupation: [employer]   Married    Past Surgical History:  Procedure Laterality Date  . ABDOMINAL HYSTERECTOMY      Family History  Problem Relation Age of Onset  . Asthma Mother   . Colon cancer Mother   . Lung cancer Maternal Uncle   . Esophageal cancer Father     smoker, mets to lungs  . Diabetes Sister     Allergies  Allergen Reactions  . Aspirin     REACTION: asthma- on respirator for 28 days  . Codeine Phosphate     REACTION: hives  . Penicillins     REACTION: wheezing and hives    Current Outpatient Prescriptions on File Prior to Visit  Medication Sig Dispense Refill  . albuterol (PROAIR HFA)  108 (90 BASE) MCG/ACT inhaler Inhale 2 puffs into the lungs every 6 (six) hours as needed. 3 each 3  . albuterol (PROVENTIL) (2.5 MG/3ML) 0.083% nebulizer solution USE 1 VIAL VIA NEBULIZER EVERY 4 HOURS AS NEEDED FOR WHEEZE/SHORTNESS OF BREATH 75 mL 6  . amphetamine-dextroamphetamine (ADDERALL) 20 MG tablet Take 1 tablet (20 mg total) by mouth 2 (two) times daily. 60 tablet 0  . amphetamine-dextroamphetamine (ADDERALL) 20 MG tablet Take 1 tablet (20 mg total) by mouth 2 (two) times daily. 60 tablet 0  . amphetamine-dextroamphetamine (ADDERALL) 20 MG tablet Take 1 tablet (20 mg total) by mouth 2 (two) times daily. 60 tablet 0  . doxycycline (VIBRA-TABS) 100 MG tablet Take 1 tablet (100 mg  total) by mouth 2 (two) times daily. 14 tablet 0  . hydrochlorothiazide (HYDRODIURIL) 25 MG tablet Take 1 tablet (25 mg total) by mouth every morning. 100 tablet 0  . methylphenidate (RITALIN) 5 MG tablet Take 1 tablet (5 mg total) by mouth daily. 30 tablet 0  . methylphenidate (RITALIN) 5 MG tablet Take 1 tablet (5 mg total) by mouth daily. 30 tablet 0  . methylphenidate (RITALIN) 5 MG tablet Take 1 tablet (5 mg total) by mouth daily. 30 tablet 0  . montelukast (SINGULAIR) 10 MG tablet Take 1 tablet (10 mg total) by mouth at bedtime. 100 tablet 3  . montelukast (SINGULAIR) 10 MG tablet TAKE 1 TABLET AT BEDTIME 100 tablet 1  . montelukast (SINGULAIR) 10 MG tablet TAKE 1 TABLET AT BEDTIME 100 tablet 0  . pantoprazole (PROTONIX) 40 MG tablet Take 1 tablet (40 mg total) by mouth daily. 90 tablet 1  . predniSONE (DELTASONE) 10 MG tablet Take 1 tablet (10 mg total) by mouth daily with breakfast. 100 tablet 3  . predniSONE (DELTASONE) 20 MG tablet 1 daily 100 tablet 3  . tolterodine (DETROL LA) 4 MG 24 hr capsule Take 1 capsule (4 mg total) by mouth daily. 100 capsule 3  . tolterodine (DETROL LA) 4 MG 24 hr capsule TAKE 1 CAPSULE DAILY 100 capsule 3   No current facility-administered medications on file prior to visit.     BP 128/72 (BP Location: Right Arm, Patient Position: Sitting, Cuff Size: Large)   Pulse 97   Temp 98.1 F (36.7 C) (Oral)   Ht 5\' 3"  (1.6 m)   Wt 212 lb 4.8 oz (96.3 kg)   SpO2 95%   BMI 37.61 kg/m       Objective:   Physical Exam  Constitutional: She is oriented to person, place, and time. She appears well-developed and well-nourished.  Eyes: Pupils are equal, round, and reactive to light. No scleral icterus.  Neck: Neck supple.  Cardiovascular: Normal rate, regular rhythm and intact distal pulses.   Pulmonary/Chest: Effort normal and breath sounds normal. She has no wheezes. She has no rales.  Abdominal: Soft. Bowel sounds are normal. There is no tenderness.    Musculoskeletal: She exhibits no edema.  Lymphadenopathy:    She has no cervical adenopathy.  Neurological: She is alert and oriented to person, place, and time.  Skin: Skin is warm and dry. No rash noted.  Psychiatric: She has a normal mood and affect. Her behavior is normal. Judgment and thought content normal.   Past Medical History:  Diagnosis Date  . A-fib ([place])   . ADD (attention deficit disorder)   . Anemia, iron deficiency   . Asthma   . Fibromyalgia   . Hiatal hernia   . Obese  Social History   Social History  . Marital status: Married    Spouse name: N/A  . Number of children: 4  . Years of education: N/A   Occupational History  . human resources    Social History Main Topics  . Smoking status: Never Smoker  . Smokeless tobacco: Never Used  . Alcohol use 0.5 oz/week    1 Standard drinks or equivalent per week  . Drug use: No  . Sexual activity: Not on file   Other Topics Concern  . Not on file   Social History Narrative   Occupation: [employer]   Married    Past Surgical History:  Procedure Laterality Date  . ABDOMINAL HYSTERECTOMY      Family History  Problem Relation Age of Onset  . Asthma Mother   . Colon cancer Mother   . Lung cancer Maternal Uncle   . Esophageal cancer Father     smoker, mets to lungs  . Diabetes Sister     Allergies  Allergen Reactions  . Aspirin     REACTION: asthma- on respirator for 28 days  . Codeine Phosphate     REACTION: hives  . Penicillins     REACTION: wheezing and hives    Current Outpatient Prescriptions on File Prior to Visit  Medication Sig Dispense Refill  . albuterol (PROAIR HFA) 108 (90 BASE) MCG/ACT inhaler Inhale 2 puffs into the lungs every 6 (six) hours as needed. 3 each 3  . albuterol (PROVENTIL) (2.5 MG/3ML) 0.083% nebulizer solution USE 1 VIAL VIA NEBULIZER EVERY 4 HOURS AS NEEDED FOR WHEEZE/SHORTNESS OF BREATH 75 mL 6  . amphetamine-dextroamphetamine (ADDERALL) 20 MG tablet Take 1  tablet (20 mg total) by mouth 2 (two) times daily. 60 tablet 0  . amphetamine-dextroamphetamine (ADDERALL) 20 MG tablet Take 1 tablet (20 mg total) by mouth 2 (two) times daily. 60 tablet 0  . amphetamine-dextroamphetamine (ADDERALL) 20 MG tablet Take 1 tablet (20 mg total) by mouth 2 (two) times daily. 60 tablet 0  . doxycycline (VIBRA-TABS) 100 MG tablet Take 1 tablet (100 mg total) by mouth 2 (two) times daily. 14 tablet 0  . hydrochlorothiazide (HYDRODIURIL) 25 MG tablet Take 1 tablet (25 mg total) by mouth every morning. 100 tablet 0  . methylphenidate (RITALIN) 5 MG tablet Take 1 tablet (5 mg total) by mouth daily. 30 tablet 0  . methylphenidate (RITALIN) 5 MG tablet Take 1 tablet (5 mg total) by mouth daily. 30 tablet 0  . methylphenidate (RITALIN) 5 MG tablet Take 1 tablet (5 mg total) by mouth daily. 30 tablet 0  . montelukast (SINGULAIR) 10 MG tablet Take 1 tablet (10 mg total) by mouth at bedtime. 100 tablet 3  . montelukast (SINGULAIR) 10 MG tablet TAKE 1 TABLET AT BEDTIME 100 tablet 1  . montelukast (SINGULAIR) 10 MG tablet TAKE 1 TABLET AT BEDTIME 100 tablet 0  . pantoprazole (PROTONIX) 40 MG tablet Take 1 tablet (40 mg total) by mouth daily. 90 tablet 1  . predniSONE (DELTASONE) 10 MG tablet Take 1 tablet (10 mg total) by mouth daily with breakfast. 100 tablet 3  . predniSONE (DELTASONE) 20 MG tablet 1 daily 100 tablet 3  . tolterodine (DETROL LA) 4 MG 24 hr capsule Take 1 capsule (4 mg total) by mouth daily. 100 capsule 3  . tolterodine (DETROL LA) 4 MG 24 hr capsule TAKE 1 CAPSULE DAILY 100 capsule 3   No current facility-administered medications on file prior to visit.  BP 128/72 (BP Location: Right Arm, Patient Position: Sitting, Cuff Size: Large)   Pulse 97   Temp 98.1 F (36.7 C) (Oral)   Ht 5\' 3"  (1.6 m)   Wt 212 lb 4.8 oz (96.3 kg)   SpO2 95%   BMI 37.61 kg/m       Assessment & Plan:  1. Other fatigue Fatigue noted which has not caused disruption of her  daily activities. Will obtain lab work as previous lab work is greater than one year ago.  We discussed the importance of stress reduction, sleep hygiene, dietary changes including a modified carbohydrate diet and weight loss. Also advised patient to make an appointment with her PCP for a physical which is now due.  She requested a refill on her Adderall. This message was sent to Sharyn Lull who is working with Dr. Sherren Mocha to review refills for patients with upcoming scheduled appointments.  Patient is concerned about her glucose levels. Lab work will be obtained and we also discussed long term use of prednisone and the relationship of glucose elevation. Advised patient to keep scheduled appointment with her PCP to discuss her chronic health conditions.   - CBC with Differential/Platelet - Basic metabolic panel - Hemoglobin A1c - Hepatic function panel  Provided written material for a modified carbohydrate diet.  Delano Metz, FNP-C

## 2015-10-18 ENCOUNTER — Telehealth: Payer: Self-pay | Admitting: Family Medicine

## 2015-10-18 NOTE — Telephone Encounter (Signed)
Okay to refill? 

## 2015-10-18 NOTE — Telephone Encounter (Signed)
Pt would like to have a referral to Dr. Criss Rosales instead of the [facility].  Pt would like to know the status of her Adderall

## 2015-10-19 NOTE — Telephone Encounter (Signed)
Called and left message for pt to call office

## 2015-10-20 MED ORDER — AMPHETAMINE-DEXTROAMPHETAMINE 20 MG PO TABS: 20.0000 mg | ORAL_TABLET | Freq: Two times a day (BID) | ORAL | 0 refills | Status: DC

## 2015-10-20 NOTE — Addendum Note (Signed)
Addended by: Precious Gilding on: 10/20/2015 09:24 AM   Modules accepted: Orders

## 2015-10-20 NOTE — Telephone Encounter (Signed)
Patient aware Rx ready and upfront. During call, patient requested referral to endocrinology to be with Dr. Jacelyn Pi at [facility], not [facility]. Referral printed and given to [facility], [facility] and let patient know we would follow up by end of day on status.

## 2015-10-20 NOTE — Telephone Encounter (Signed)
Referral has been sent to Dr. Owens Loffler office and they will be contacting patient with appointment

## 2015-10-20 NOTE — Telephone Encounter (Signed)
ok 

## 2015-10-20 NOTE — Telephone Encounter (Signed)
Please see other note

## 2015-10-25 ENCOUNTER — Other Ambulatory Visit: Payer: Managed Care, Other (non HMO)

## 2015-10-27 ENCOUNTER — Other Ambulatory Visit (INDEPENDENT_AMBULATORY_CARE_PROVIDER_SITE_OTHER): Payer: Managed Care, Other (non HMO)

## 2015-10-27 DIAGNOSIS — E876 Hypokalemia: Secondary | ICD-10-CM

## 2015-10-27 LAB — POTASSIUM: Potassium: 3.6 mmol/L (ref 3.5–5.3)

## 2015-11-20 ENCOUNTER — Other Ambulatory Visit: Payer: Self-pay | Admitting: Family Medicine

## 2015-12-19 ENCOUNTER — Telehealth: Payer: Self-pay | Admitting: Family Medicine

## 2015-12-19 NOTE — Telephone Encounter (Signed)
° ° °  Pt request refill of the following:   amphetamine-dextroamphetamine (ADDERALL) 20 MG tablet   methylphenidate (RITALIN) 5 MG tablet  Phamacy:

## 2015-12-20 ENCOUNTER — Other Ambulatory Visit: Payer: Self-pay | Admitting: Emergency Medicine

## 2015-12-20 MED ORDER — METHYLPHENIDATE HCL 5 MG PO TABS: 5.0000 mg | ORAL_TABLET | Freq: Every day | ORAL | 0 refills | Status: DC

## 2015-12-20 MED ORDER — AMPHETAMINE-DEXTROAMPHETAMINE 20 MG PO TABS
20.0000 mg | ORAL_TABLET | Freq: Two times a day (BID) | ORAL | 0 refills | Status: DC
Start: 2015-12-20 — End: 2016-03-06

## 2015-12-20 MED ORDER — AMPHETAMINE-DEXTROAMPHETAMINE 20 MG PO TABS: 20.0000 mg | ORAL_TABLET | Freq: Two times a day (BID) | ORAL | 0 refills | Status: DC

## 2015-12-20 NOTE — Telephone Encounter (Signed)
Called and spoke with pt informing her prescription have been printed and up front ready for pick up. Pt verbalized understanding. CPE appointment scheduled 02/28/16

## 2016-01-12 ENCOUNTER — Other Ambulatory Visit: Payer: Self-pay

## 2016-01-12 MED ORDER — HYDROCHLOROTHIAZIDE 25 MG PO TABS: 25.0000 mg | ORAL_TABLET | Freq: Every morning | ORAL | 1 refills | Status: DC

## 2016-01-15 ENCOUNTER — Encounter: Payer: Self-pay | Admitting: Family Medicine

## 2016-01-15 ENCOUNTER — Ambulatory Visit (INDEPENDENT_AMBULATORY_CARE_PROVIDER_SITE_OTHER): Payer: Managed Care, Other (non HMO) | Admitting: Family Medicine

## 2016-01-15 VITALS — BP 148/94 | HR 117 | Temp 98.6°F | Wt 203.9 lb

## 2016-01-15 DIAGNOSIS — J4541 Moderate persistent asthma with (acute) exacerbation: Secondary | ICD-10-CM | POA: Diagnosis not present

## 2016-01-15 MED ORDER — PREDNISONE 20 MG PO TABS: ORAL_TABLET | ORAL | 1 refills | Status: DC

## 2016-01-15 MED ORDER — BECLOMETHASONE DIPROPIONATE 80 MCG/ACT IN AERS: 2.0000 | INHALATION_SPRAY | Freq: Two times a day (BID) | RESPIRATORY_TRACT | 1 refills | Status: DC

## 2016-01-15 MED ORDER — BECLOMETHASONE DIPROPIONATE 80 MCG/ACT IN AERS: 2.0000 | INHALATION_SPRAY | Freq: Two times a day (BID) | RESPIRATORY_TRACT | 4 refills | Status: DC

## 2016-01-15 NOTE — Progress Notes (Signed)
Pre visit review using our clinic review tool, if applicable. No additional management support is needed unless otherwise documented below in the visit note. 

## 2016-01-15 NOTE — Patient Instructions (Signed)
Prednisone 20 mg..... 2 tabs now..... 2 tabs at bedtime tonight....... then starting tomorrow 2 tabs daily for 3 days or until you feel significantly improved then taper as outlined  Qvar 80....... 2 puffs twice a day  Continue other medications  Return Wednesday morning at 8:45 a.m. for follow-up

## 2016-01-15 NOTE — Progress Notes (Signed)
[name] is a 55 year old married female nonsmoker who comes in today for evaluation of 2 problems  She tells me that a couple weeks ago she was here didn't feel well she had an elevated blood sugar A1c 6.5%. She went to see an endocrinologist who started her on metformin. Also at that time on September 29 she just stopped her steroids. She been on 10 mg of prednisone daily for couple years. This was done because she has severe asthma and has had multiple admissions many of which he require ventilation. Despite being on the prednisone 10 mg daily for a number of years she did not have any side effects when she tapered off the medicine until now. Last night she had a severe asthma attack. She has albuterol and Singulair.  Last Wednesday she had a flu shot at work Friday began aching all over. She's been aching all over without fever now for the last 4 days. She's never had a reaction to flu shot like this before  Physical examination vital signs stable she's afebrile except for slightly elevated blood pressure 148/94 heart rate 1:15 pulse ox on room air 96%  In general she is a well-developed well-nourished female no acute distress. Cardiopulmonary exam shows symmetrical breath sounds mild to moderate inspiratory and expiratory wheezing  #1 asthma flare............Marland Kitchen restart oral prednisone........Marland Kitchen restart Qvar........ follow-up in one week  #2 systemic reaction to flu shot with diffuse myalgias.......Marland Kitchen reassured

## 2016-01-17 ENCOUNTER — Encounter: Payer: Self-pay | Admitting: Family Medicine

## 2016-01-17 ENCOUNTER — Ambulatory Visit (INDEPENDENT_AMBULATORY_CARE_PROVIDER_SITE_OTHER): Payer: Managed Care, Other (non HMO) | Admitting: Family Medicine

## 2016-01-17 ENCOUNTER — Telehealth: Payer: Self-pay | Admitting: Family Medicine

## 2016-01-17 VITALS — BP 150/98 | HR 94 | Temp 98.1°F | Wt 204.0 lb

## 2016-01-17 DIAGNOSIS — F988 Other specified behavioral and emotional disorders with onset usually occurring in childhood and adolescence: Secondary | ICD-10-CM | POA: Diagnosis not present

## 2016-01-17 DIAGNOSIS — R030 Elevated blood-pressure reading, without diagnosis of hypertension: Secondary | ICD-10-CM

## 2016-01-17 DIAGNOSIS — J4541 Moderate persistent asthma with (acute) exacerbation: Secondary | ICD-10-CM

## 2016-01-17 DIAGNOSIS — R739 Hyperglycemia, unspecified: Secondary | ICD-10-CM | POA: Diagnosis not present

## 2016-01-17 MED ORDER — AMPHETAMINE-DEXTROAMPHET ER 20 MG PO CP24
20.0000 mg | ORAL_CAPSULE | Freq: Every day | ORAL | 0 refills | Status: DC
Start: 2016-01-17 — End: 2016-03-06

## 2016-01-17 NOTE — Progress Notes (Signed)
Carrie [name] is a 55 year old married female nonsmoker who comes in today for follow-up of asthma  We saw her on Monday with a flare of her asthma. Her husband is been stable for many many many years because she's been on 10 mg of prednisone daily. We have tried over the years to decrease her reliance on prednisone but every time we dropped below 10 mg a day she starts wheezing. However recently she's developed diabetes her weight is gone up as his blood pressure. She  She went endocrinology was started on metformin 500 mg daily she's on a carbohydrate free diet  BP today 160/90 right arm sitting position repeat by me. Family history positive for hypertension  She's on 40 mg of prednisone twice a day her wheezing has decreased by about 35%. She's improved but not able to go back to work. I think should be a local back to work on Monday. We then discussed pulmonary consult to see if anything else could be done in lieu of long-term oral steroids.  Temp 98 pulse 80 and regular BP 160/90 weight 204  HEENT were negative neck was supple no adenopathy or exam shows symmetrical breath sounds which have increased since Monday. 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 Daily glucometer follow-up in 2 weeks #3 hypertension?,,,,,,,, blood pressure check daily at home follow-up in 2 weeks

## 2016-01-17 NOTE — Telephone Encounter (Signed)
Spoke with pt and advised. Pt requested information be sent via MyChart. Message delivered. Nothing further needed.

## 2016-01-17 NOTE — Telephone Encounter (Signed)
Pt states Dr Sherren Mocha forgot to tell her what bp machine to order from [employer]. Would like a call back with this information

## 2016-01-17 NOTE — Patient Instructions (Signed)
Prednisone 20 mg....... 2 tabs twice daily until you notice significant improvement then begin to taper as outlined......... 2 tabs daily for 3 days.........Marland Kitchen 1 tab daily for 3 days....... half a tab daily for 3 days....... then a half a tab Monday Wednesday Friday for a three-week taper  Continue inhaled medications  Check a fasting blood sugar and blood pressure daily in the morning  Okay to return to work on Monday.  Return to see me next Wednesday with a record of all your blood pressure readings and blood sugar readings.

## 2016-01-17 NOTE — Progress Notes (Signed)
Pre visit review using our clinic review tool, if applicable. No additional management support is needed unless otherwise documented below in the visit note. 

## 2016-01-24 ENCOUNTER — Encounter: Payer: Self-pay | Admitting: Family Medicine

## 2016-01-24 ENCOUNTER — Ambulatory Visit (INDEPENDENT_AMBULATORY_CARE_PROVIDER_SITE_OTHER): Payer: Managed Care, Other (non HMO) | Admitting: Family Medicine

## 2016-01-24 VITALS — BP 118/82 | HR 94 | Temp 98.2°F | Ht 63.0 in | Wt 200.0 lb

## 2016-01-24 DIAGNOSIS — J4541 Moderate persistent asthma with (acute) exacerbation: Secondary | ICD-10-CM

## 2016-01-24 NOTE — Progress Notes (Signed)
Carrie [name] is a 55 year old married female nonsmoker who comes in today for follow-up of asthma  Per previous note I been taking care of Carrie [name] now for about 40 years. She's had multiple admissions for asthma in the past including having to be ventilated on her wedding night.  She's been symptom-free over the last 4 years and has not missed any work from asthma on 10 mg of prednisone daily. We've tried multiple other medications and nothing but this low-dose prednisone keeps her symptom-free.  She went off her prednisone in September Cozaar blood sugar was elevated. She start on metformin and a diet. She's lost 13 pounds. However she had another flare of her asthma which we've been treating the last couple weeks. She's down to 10 mg of prednisone today and feels about 85% better.  She has an appointment to see Carrie [name] on the 28th of this month to see if there is anything new we can do for her asthma.  Blood pressure was elevated on last visit. She purchased a pump up digital blood pressure cuff has been checking her blood pressure at home. Blood pressures are home are normal.  Blood sugars at home are normal despite being on the 10 mg of prednisone a day.  Vital signs stable she's afebrile pulmonary exam shows symmetrical very faint late expiratory wheezing on forced expiration  Impression chronic lifelong asthma with acute exacerbation.... 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 continue metformin diet exercise and weight loss  Elevated blood pressure........ secondary to underlying illness. Blood pressure now normal.

## 2016-01-24 NOTE — Patient Instructions (Signed)
Prednisone 10 mg........Marland Kitchen 1 daily in the morning  Continue other medications  Since her blood pressures normal I would check your blood pressure weekly  Continue the diet exercise weight loss and blood sugar monitoring and metformin  Return in December as outlined.

## 2016-01-29 ENCOUNTER — Other Ambulatory Visit: Payer: Self-pay | Admitting: Family Medicine

## 2016-01-29 ENCOUNTER — Ambulatory Visit: Payer: Managed Care, Other (non HMO) | Admitting: Family Medicine

## 2016-02-05 ENCOUNTER — Encounter (HOSPITAL_BASED_OUTPATIENT_CLINIC_OR_DEPARTMENT_OTHER): Payer: Self-pay | Admitting: *Deleted

## 2016-02-05 ENCOUNTER — Emergency Department (HOSPITAL_BASED_OUTPATIENT_CLINIC_OR_DEPARTMENT_OTHER): Payer: Managed Care, Other (non HMO)

## 2016-02-05 ENCOUNTER — Emergency Department (HOSPITAL_BASED_OUTPATIENT_CLINIC_OR_DEPARTMENT_OTHER)
Admission: EM | Admit: 2016-02-05 | Discharge: 2016-02-05 | Disposition: A | Discharge status: Home / Self Care | Payer: Managed Care, Other (non HMO) | Attending: Emergency Medicine | Admitting: Emergency Medicine

## 2016-02-05 DIAGNOSIS — Z7984 Long term (current) use of oral hypoglycemic drugs: Secondary | ICD-10-CM | POA: Insufficient documentation

## 2016-02-05 DIAGNOSIS — R1032 Left lower quadrant pain: Secondary | ICD-10-CM | POA: Diagnosis present

## 2016-02-05 DIAGNOSIS — K529 Noninfective gastroenteritis and colitis, unspecified: Secondary | ICD-10-CM

## 2016-02-05 DIAGNOSIS — F909 Attention-deficit hyperactivity disorder, unspecified type: Secondary | ICD-10-CM | POA: Insufficient documentation

## 2016-02-05 DIAGNOSIS — K6389 Other specified diseases of intestine: Secondary | ICD-10-CM

## 2016-02-05 DIAGNOSIS — J45909 Unspecified asthma, uncomplicated: Secondary | ICD-10-CM | POA: Diagnosis not present

## 2016-02-05 DIAGNOSIS — K659 Peritonitis, unspecified: Secondary | ICD-10-CM | POA: Insufficient documentation

## 2016-02-05 LAB — COMPREHENSIVE METABOLIC PANEL
ALT: 12 U/L — ABNORMAL LOW (ref 14–54)
AST: 14 U/L — ABNORMAL LOW (ref 15–41)
Albumin: 3.8 g/dL (ref 3.5–5.0)
Alkaline Phosphatase: 63 U/L (ref 38–126)
Anion gap: 8 (ref 5–15)
BUN: 15 mg/dL (ref 6–20)
CO2: 26 mmol/L (ref 22–32)
Calcium: 9 mg/dL (ref 8.9–10.3)
Chloride: 102 mmol/L (ref 101–111)
Creatinine, Ser: 0.82 mg/dL (ref 0.44–1.00)
GFR calc Af Amer: >60 mL/min (ref >60)
GFR calc non Af Amer: >60 mL/min (ref >60)
Glucose, Bld: 120 mg/dL — ABNORMAL HIGH (ref 65–99)
Potassium: 3.6 mmol/L (ref 3.5–5.1)
Sodium: 136 mmol/L (ref 135–145)
Total Bilirubin: 0.6 mg/dL (ref 0.3–1.2)
Total Protein: 6.7 g/dL (ref 6.5–8.1)

## 2016-02-05 LAB — CBC WITH DIFFERENTIAL/PLATELET
Basophils Absolute: 0 10*3/uL (ref 0.0–0.1)
Basophils Relative: 0 %
Eosinophils Absolute: 0 10*3/uL (ref 0.0–0.7)
Eosinophils Relative: 0 %
HCT: 42.3 % (ref 36.0–46.0)
Hemoglobin: 13.6 g/dL (ref 12.0–15.0)
Lymphocytes Relative: 13 %
Lymphs Abs: 1 10*3/uL (ref 0.7–4.0)
MCH: 29.1 pg (ref 26.0–34.0)
MCHC: 32.2 g/dL (ref 30.0–36.0)
MCV: 90.6 fL (ref 78.0–100.0)
Monocytes Absolute: 0.4 10*3/uL (ref 0.1–1.0)
Monocytes Relative: 6 %
Neutro Abs: 6.3 10*3/uL (ref 1.7–7.7)
Neutrophils Relative %: 81 %
Platelets: 245 10*3/uL (ref 150–400)
RBC: 4.67 MIL/uL (ref 3.87–5.11)
RDW: 13.4 % (ref 11.5–15.5)
WBC: 7.8 10*3/uL (ref 4.0–10.5)

## 2016-02-05 LAB — URINALYSIS, ROUTINE W REFLEX MICROSCOPIC
Bilirubin Urine: NEGATIVE
Glucose, UA: NEGATIVE mg/dL
Ketones, ur: NEGATIVE mg/dL
Nitrite: NEGATIVE
Protein, ur: NEGATIVE mg/dL
Specific Gravity, Urine: 1.02 (ref 1.005–1.030)
pH: 7.5 (ref 5.0–8.0)

## 2016-02-05 LAB — URINE MICROSCOPIC-ADD ON

## 2016-02-05 LAB — LIPASE, BLOOD: Lipase: 20 U/L (ref 11–51)

## 2016-02-05 MED ORDER — HYDROCODONE-ACETAMINOPHEN 5-325 MG PO TABS: 1.0000 | ORAL_TABLET | ORAL | 0 refills | Status: DC | PRN

## 2016-02-05 MED ORDER — IOPAMIDOL (ISOVUE-300) INJECTION 61%
100.0000 mL | Freq: Once | INTRAVENOUS | Status: AC | PRN
  Administered 2016-02-05: 100 mL via INTRAVENOUS

## 2016-02-05 NOTE — Discharge Instructions (Signed)
Take ibuprofen 600mg  every six hours. Take the Norco with it as needed for severe pain. Remember to be careful because norco can make you drowsy. Follow up with your primary care provider. Return to the ER for new or worsening symptoms.

## 2016-02-05 NOTE — ED Provider Notes (Signed)
[facility] Provider Note   CSN: [id number] Arrival date & time: 02/05/16  1722  By signing my name below, I, Ephriam Jenkins, attest that this documentation has been prepared under the direction and in the presence of [employer].  Electronically Signed: Ephriam Jenkins, ED Scribe. 02/05/16. 6:13 PM.   History   Chief Complaint Chief Complaint  Patient presents with  . Abdominal Pain    HPI HPI Comments: Carrie [name] is a 55 y.o. female with Hx of abd Hysterectomy, who presents to the Emergency Department complaining of gradually worsening, constant, left lower abdominal pain that started yesterday. Pt states that she started to have mild pain in the area yesterday which worsened today. The pain is exacerbated when standing or ambulating. No Hx of similar symptoms. She has has normal bowel and bladder movements. Pt denies any nausea, vomiting, diarrhea, vaginal discharge. Denies urinary symptoms. Denies fever or chills.  The history is provided by the patient. No language interpreter was used.    Past Medical History:  Diagnosis Date  . A-fib ([place])   . ADD (attention deficit disorder)   . Anemia, iron deficiency   . Asthma   . Fibromyalgia   . Hiatal hernia   . Obese     Patient Active Problem List   Diagnosis Date Noted  . Elevated blood pressure reading 01/17/2016  . Elevated blood sugar 01/17/2016  . Family history of colon cancer 06/14/2014  . Gastroesophageal reflux disease with hiatal hernia 06/02/2014  . Pain of left shoulder region 06/03/2013  . Incontinence 02/05/2011  . UNSPECIFIED INTERNAL DERANGEMENT OF KNEE 02/01/2010  . ABDOMINAL PAIN, EPIGASTRIC 11/24/2009  . DEGENERATIVE DISC DISEASE, CERVICAL SPINE, W/RADICULOPATHY 04/21/2009  . Attention deficit disorder 11/30/2007  . Asthma 01/13/2007    Past Surgical History:  Procedure Laterality Date  . ABDOMINAL HYSTERECTOMY      OB History    No data available       Home  Medications    Prior to Admission medications   Medication Sig Start Date End Date Taking? Authorizing Provider  albuterol (PROAIR HFA) 108 (90 BASE) MCG/ACT inhaler Inhale 2 puffs into the lungs every 6 (six) hours as needed. 02/15/15   Dorena Cookey, MD  albuterol (PROVENTIL) (2.5 MG/3ML) 0.083% nebulizer solution USE 1 VIAL VIA NEBULIZER EVERY 4 HOURS AS NEEDED FOR WHEEZE/SHORTNESS OF BREATH 02/04/14   Dorena Cookey, MD  amphetamine-dextroamphetamine (ADDERALL XR) 20 MG 24 hr capsule Take 1 capsule (20 mg total) by mouth daily. 01/17/16   Dorena Cookey, MD  amphetamine-dextroamphetamine (ADDERALL) 20 MG tablet Take 1 tablet (20 mg total) by mouth 2 (two) times daily. 12/20/15   Dorena Cookey, MD  beclomethasone (QVAR) 80 MCG/ACT inhaler Inhale 2 puffs into the lungs 2 (two) times daily. 01/15/16   Dorena Cookey, MD  hydrochlorothiazide (HYDRODIURIL) 25 MG tablet Take 1 tablet (25 mg total) by mouth every morning. 01/12/16   Dorena Cookey, MD  metFORMIN (GLUCOPHAGE-XR) 500 MG 24 hr tablet  01/10/16   Historical Provider, MD  methylphenidate (RITALIN) 5 MG tablet Take 1 tablet (5 mg total) by mouth daily. 12/20/15   Dorena Cookey, MD  montelukast (SINGULAIR) 10 MG tablet TAKE 1 TABLET AT BEDTIME (NEED PHYSICAL) 11/21/15   Dorena Cookey, MD  pantoprazole (PROTONIX) 40 MG tablet Take 1 tablet (40 mg total) by mouth daily. Patient not taking: Reported on 01/24/2016 09/28/14   Lafayette Dragon, MD  predniSONE (DELTASONE) 10 MG  tablet  01/11/16   Historical Provider, MD  tolterodine (DETROL LA) 4 MG 24 hr capsule TAKE 1 CAPSULE DAILY 01/29/16   Dorena Cookey, MD    Family History Family History  Problem Relation Age of Onset  . Asthma Mother   . Colon cancer Mother   . Lung cancer Maternal Uncle   . Esophageal cancer Father     smoker, mets to lungs  . Diabetes Sister     Social History Social History  Substance Use Topics  . Smoking status: Never Smoker  . Smokeless tobacco: Never Used    . Alcohol use 0.5 oz/week    1 Standard drinks or equivalent per week    Allergies   Aspirin; Codeine phosphate; and Penicillins   Review of Systems Review of Systems 10 Systems reviewed and all are negative for acute change except as noted in the HPI.  Physical Exam Updated Vital Signs BP 111/81   Pulse 95   Temp 98.1 F (36.7 C)   Resp 16   Ht 5\' 3"  (1.6 m)   Wt 200 lb (90.7 kg)   SpO2 97%   BMI 35.43 kg/m   Physical Exam  Constitutional: She is oriented to person, place, and time.  HENT:  Right Ear: External ear normal.  Left Ear: External ear normal.  Nose: Nose normal.  Mouth/Throat: Oropharynx is clear and moist. No oropharyngeal exudate.  Eyes: Conjunctivae are normal.  Neck: Neck supple.  Cardiovascular: Normal rate, regular rhythm, normal heart sounds and intact distal pulses.   Pulmonary/Chest: Effort normal and breath sounds normal. No respiratory distress. She has no wheezes.  Abdominal: Soft. Bowel sounds are normal. She exhibits no distension. There is tenderness. There is no rebound and no guarding.  Point tenderness at LLQ without guarding No CVA tenderness  Musculoskeletal: She exhibits no edema.  Lymphadenopathy:    She has no cervical adenopathy.  Neurological: She is alert and oriented to person, place, and time. No cranial nerve deficit.  Skin: Skin is warm and dry.  Psychiatric: She has a normal mood and affect.  Nursing note and vitals reviewed.    ED Treatments / Results  DIAGNOSTIC STUDIES: Oxygen Saturation is 97% on RA, normal by my interpretation.  COORDINATION OF CARE: 6:12 PM-Will order CT. Discussed treatment plan with pt at bedside and pt agreed to plan.   Labs (all labs ordered are listed, but only abnormal results are displayed) Labs Reviewed  URINALYSIS, ROUTINE W REFLEX MICROSCOPIC (NOT AT [facility]) - Abnormal; Notable for the following:       Result Value   Hgb urine dipstick TRACE (*)    Leukocytes, UA SMALL (*)    All  other components within normal limits  URINE MICROSCOPIC-ADD ON - Abnormal; Notable for the following:    Squamous Epithelial / LPF 0-5 (*)    Bacteria, UA RARE (*)    All other components within normal limits  COMPREHENSIVE METABOLIC PANEL - Abnormal; Notable for the following:    Glucose, Bld 120 (*)    AST 14 (*)    ALT 12 (*)    All other components within normal limits  LIPASE, BLOOD  CBC WITH DIFFERENTIAL/PLATELET    EKG  EKG Interpretation None       Radiology Ct Abdomen Pelvis W Contrast  Result Date: 02/05/2016 CLINICAL DATA:  Left lower quadrant pain EXAM: CT ABDOMEN AND PELVIS WITH CONTRAST TECHNIQUE: Multidetector CT imaging of the abdomen and pelvis was performed using the standard protocol  following bolus administration of intravenous contrast. CONTRAST:  138mL ISOVUE-300 IOPAMIDOL (ISOVUE-300) INJECTION 61% COMPARISON:  None. FINDINGS: Lower chest: Subsegmental atelectasis. Tiny Bochdalek's hernia at the right posterior lung base containing fat. Hepatobiliary: Several lesions are scattered throughout the liver. They are innumerable and range in size from a few mm to the largest, which is in the right lobe, on image 13 of series 2, measuring 2.4 cm. There is funneling of contrast peripherally which is discontinuous. On delayed images, there is progressive filling. This is compatible with a hemangioma. The other lesions are too small to characterize. Gallbladder is decompressed. Pancreas: Unremarkable. Spleen: Unremarkable. Adrenals/Urinary Tract: 1.4 cm angio mild lipoma in the lower pole of the left kidney. Other small hypodensities in both kidneys are too small to characterize and are nonspecific. No calculus. No hydronephrosis. Stomach/Bowel: There is a focal round area of inflammatory change within the fat anterior to the distal descending colon. See image 58 of series 2. These findings are most consistent with gastroepiploic appendagitis. There is no evidence of  perforation or extraluminal bowel gas. There is no evidence of abscess formation. There is no convincing evidence of colonic wall thickening. The descending colon and sigmoid colon are decompressed. Normal appendix. No evidence of small-bowel obstruction. Vascular/Lymphatic: No evidence of aortic aneurysm. Small para-aortic nodes. Reproductive: Uterus is absent.  Adnexa are within normal limits. Other: No free-fluid. Musculoskeletal: No vertebral compression deformity. Lower lumbar facet arthropathy is present. IMPRESSION: Acute gastroepiploic appendagitis in at the distal descending colon. No evidence of abscess or perforation. Electronically Signed   By: Marybelle Killings M.D.   On: 02/05/2016 19:27    Procedures Procedures (including critical care time)  Medications Ordered in ED Medications - No data to display   Initial Impression / Assessment and Plan / ED Course  I have reviewed the triage vital signs and the nursing notes.  Pertinent labs & imaging results that were available during my care of the patient were reviewed by me and considered in my medical decision making (see chart for details).  Clinical Course   labs unremarkable. CT reveals epoploic appendagitis. Discussed expectant management. Encouraged close f/u with pcp. ER return precautions given.  Final diagnoses:  Epiploic appendagitis    New Prescriptions Discharge Medication List as of 02/05/2016  7:49 PM    START taking these medications   Details  HYDROcodone-acetaminophen (NORCO/VICODIN) 5-325 MG tablet Take 1 tablet by mouth every 4 (four) hours as needed for severe pain., Starting Mon 02/05/2016, Print       I personally performed the services described in this documentation, which was scribed in my presence. The recorded information has been reviewed and is accurate.     Anne Ng, PA-C 02/07/16 0840    Charlesetta Shanks, MD 02/08/16 1324

## 2016-02-05 NOTE — ED Notes (Signed)
ED Provider at bedside. 

## 2016-02-05 NOTE — ED Triage Notes (Signed)
Pt  c/o left lower abd pain  X 1 day Denies n/v/d

## 2016-02-11 ENCOUNTER — Other Ambulatory Visit: Payer: Self-pay | Admitting: Family Medicine

## 2016-02-13 ENCOUNTER — Ambulatory Visit (INDEPENDENT_AMBULATORY_CARE_PROVIDER_SITE_OTHER): Payer: Managed Care, Other (non HMO) | Admitting: Internal Medicine

## 2016-02-13 ENCOUNTER — Encounter: Payer: Self-pay | Admitting: Internal Medicine

## 2016-02-13 VITALS — BP 120/80 | HR 97 | Ht 63.0 in | Wt 203.2 lb

## 2016-02-13 DIAGNOSIS — J4530 Mild persistent asthma, uncomplicated: Secondary | ICD-10-CM

## 2016-02-13 MED ORDER — FAMOTIDINE 20 MG PO TABS: ORAL_TABLET | ORAL | 3 refills | Status: DC

## 2016-02-13 MED ORDER — PREDNISONE 5 MG (21) PO TBPK: 5.0000 mg | ORAL_TABLET | Freq: Every day | ORAL | 2 refills | Status: DC

## 2016-02-13 MED ORDER — FAMOTIDINE 20 MG PO TABS: ORAL_TABLET | ORAL | 0 refills | Status: DC

## 2016-02-13 MED ORDER — PANTOPRAZOLE SODIUM 40 MG PO TBEC: 40.0000 mg | DELAYED_RELEASE_TABLET | Freq: Every day | ORAL | 3 refills | Status: DC

## 2016-02-13 MED ORDER — PANTOPRAZOLE SODIUM 40 MG PO TBEC: 40.0000 mg | DELAYED_RELEASE_TABLET | Freq: Every day | ORAL | 0 refills | Status: DC

## 2016-02-13 NOTE — Progress Notes (Signed)
Subjective:     Patient ID: Carrie [name], female   DOB: 1960/03/22,    MRN: [id number]  HPI   55 yobf never smoker onset asthma age 55 and placed on shots for "everything that grows" needed to stay on inhalers / complicated by 3 admits requiring ventilator last time around age 55 and overall much better since then but intermittently on prednisone and as of 2011 required daily prednisone so referred to pulmonary clinic 02/13/2016 by Dr   Sherren Mocha     02/13/2016 1st [facility] office visit/ Uriah Philipson  maint rx qvar 80 2bid/ singiulair/ pred 10  Chief Complaint  Patient presents with  . Pulmonary Consult    Referred by Dr. Sherren Mocha for eval of Asthma. Pt states that she has been on prednisone for years for her asthma. She is currently not having any symptoms.   for the last six years only comes off prednisone for a few days then back on it due to wheeze/ sob s much cough and presently on 10 mg daily and qvar 80 2 every 12 hours and no saba x sev weeks   Apparently doesn't taper below 10 in any increment Has intermittent hb on prn ppi     Presently No obvious day to day or daytime variability or assoc excess/ purulent sputum or mucus plugs or hemoptysis or cp or chest tightness, subjective wheeze.. No unusual exp hx or h/o childhood pna/ asthma or knowledge of premature birth.  Sleeping ok without nocturnal  or early am exacerbation  of respiratory  c/o's or need for noct saba. Also denies any obvious fluctuation of symptoms with weather or environmental changes or other aggravating or alleviating factors except as outlined above   Current Medications, Allergies, Complete Past Medical History, Past Surgical History, Family History, and Social History were reviewed in [employer] record.  ROS  The following are not active complaints unless bolded sore throat, dysphagia, dental problems, itching, sneezing,  nasal congestion or excess/ purulent secretions, ear ache,    fever, chills, sweats, unintended wt loss, classically pleuritic or exertional cp,  orthopnea pnd or leg swelling, presyncope, palpitations, abdominal pain, anorexia, nausea, vomiting, diarrhea  or change in bowel or bladder habits, change in stools or urine, dysuria,hematuria,  rash, arthralgias, visual complaints, headache, numbness, weakness or ataxia or problems with walking or coordination,  change in mood/affect or memory.              Review of Systems     Objective:   Physical Exam    Pleasant amb obese bf not overtly cushingnoid  Wt Readings from Last 3 Encounters:  02/13/16 203 lb 3.2 oz (92.2 kg)  02/05/16 200 lb (90.7 kg)  01/24/16 200 lb (90.7 kg)    Vital signs reviewed - Note on arrival 02 sats  95% on RA     HEENT: nl dentition, turbinates, and oropharynx. Nl external ear canals without cough reflex   NECK :  without JVD/Nodes/TM/ nl carotid upstrokes bilaterally   LUNGS: no acc muscle use,  Nl contour chest which is clear to A and P bilaterally without cough on insp or exp maneuvers   CV:  RRR  no s3 or murmur or increase in P2, nad no edema   ABD:  soft and nontender with nl inspiratory excursion in the supine position. No bruits or organomegaly appreciated, bowel sounds nl  MS:  Nl gait/ ext warm without deformities, calf tenderness, cyanosis or clubbing No obvious joint restrictions  SKIN: warm and dry without lesions    NEURO:  alert, approp, nl sensorium with  no motor or cerebellar deficits apparent.    Cbc 02/05/16 s eos No recent cxr on file     Assessment:

## 2016-02-13 NOTE — Patient Instructions (Addendum)
Pantoprazole (protonix) 40 mg   Take  30-60 min before first meal of the day and Pepcid (famotidine)  20 mg one @  bedtime until return to office - this is the best way to tell whether stomach acid is contributing to your problem.     GERD (REFLUX)  is an extremely common cause of respiratory symptoms just like yours , many times with no obvious heartburn at all.    It can be treated with medication, but also with lifestyle changes including elevation of the head of your bed (ideally with 6 inch  bed blocks),  Smoking cessation, avoidance of late meals, excessive alcohol, and avoid fatty foods, chocolate, peppermint, colas, red wine, and acidic juices such as orange juice.  NO MINT OR MENTHOL PRODUCTS SO NO COUGH DROPS   USE SUGARLESS CANDY INSTEAD (Jolley ranchers or Stover's or Life Savers) or even ice chips will also do - the key is to swallow to prevent all throat clearing. NO OIL BASED VITAMINS - use powdered substitutes.  Qvar 80 Take 2 puffs first thing in am and then another 2 puffs about 12 hours later.   Work on inhaler technique:  relax and gently blow all the way out then take a nice smooth deep breath back in, triggering the inhaler at same time you start breathing in.  Hold for up to 5 seconds if you can. Blow out thru nose. Rinse and gargle with water when done     Only use your albuterol as a rescue medication to be used if you can't catch your breath by resting or doing a relaxed purse lip breathing pattern.  - The less you use it, the better it will work when you need it. - Ok to use up to 2 puffs  every 4 hours if you must but call for immediate appointment if use goes up over your usual need - Don't leave home without it !!  (think of it like the spare tire for your car)   Try Prednisone 5 mg daily x 2 weeks then  5 alternating with 2.5 mg (one-half pill) - if worse resume previous dose  Please schedule a follow up office visit in 6 weeks, call sooner if needed

## 2016-02-14 NOTE — Assessment & Plan Note (Signed)
Body mass index is 36   Lab Results  Component Value Date   TSH 2.40 10/16/2015     Contributing to gerd tendency/ doe/reviewed the need and the process to achieve and maintain neg calorie balance > defer f/u primary care including intermittently monitoring thyroid status

## 2016-02-14 NOTE — Assessment & Plan Note (Signed)
Spirometry 02/13/2016  Nl x mid flows reduced to 56% and min curvature  pre - saba on qvar 80 2bid and pred 10 mg daily   She clearly has difficult to control asthma. DDX of  difficult airways management almost all start with A and  include Adherence, Ace Inhibitors, Acid Reflux, Active Sinus Disease, Alpha 1 Antitripsin deficiency, Anxiety masquerading as Airways dz,  ABPA,  Allergy(esp in young), Aspiration (esp in elderly), Adverse effects of meds,  Active smokers, A bunch of PE's (a small clot burden can't cause this syndrome unless there is already severe underlying pulm or vascular dz with poor reserve) plus two Bs  = Bronchiectasis and Beta blocker use..and one C= CHF   Adherence is always the initial "prime suspect" and is a multilayered concern that requires a "trust but verify" approach in every patient - starting with knowing how to use medications, especially inhalers, correctly, keeping up with refills and understanding the fundamental difference between maintenance and prns vs those medications only taken for a very short course and then stopped and not refilled.  - The proper method of use, as well as anticipated side effects, of a metered-dose inhaler are discussed and demonstrated to the patient. Improved effectiveness after extensive coaching during this visit to a level of approximately 75 % from a baseline of 50 % > continue qvar 80 2bid   ? Allergy Laurine Blazer dep/  steroid dep >  The goal with a chronic steroid dependent illness is always arriving at the lowest effective dose that controls the disease/symptoms and not accepting a set "formula" which is based on statistics or guidelines that don't always take into account patient  variability or the natural hx of the dz in every individual patient, which may well vary over time.  For now therefore I recommend the patient maintain  A new floor of 5/2.5 if tolerated and when flares repeat her IgE/ Eos/ allergy profile to see if eligble for  xolair or one of the IL-5 inhibitors   ? Acid (or non-acid) GERD > always difficult to exclude as up to 75% of pts in some series report no assoc GI/ Heartburn symptoms> rec max (24h)  acid suppression and diet restrictions/ reviewed and instructions given in writing.   ? Active sinus dz > consider sinus ct later   ? abpa > unlikely s more cough or eos but will check her IgE at next flare   Total time devoted to counseling  = 35/48m review case with pt/ discussion of options/alternatives/ personally creating written instructions  in presence of pt  then going over those specific  Instructions directly with the pt including how to use all of the meds but in particular covering each new medication in detail and the difference between the maintenance/automatic meds and the prns using an action plan format for the latter.

## 2016-02-19 ENCOUNTER — Other Ambulatory Visit: Payer: Self-pay | Admitting: Family Medicine

## 2016-02-20 ENCOUNTER — Encounter: Payer: Self-pay | Admitting: Internal Medicine

## 2016-02-28 ENCOUNTER — Other Ambulatory Visit (INDEPENDENT_AMBULATORY_CARE_PROVIDER_SITE_OTHER): Payer: Managed Care, Other (non HMO)

## 2016-02-28 DIAGNOSIS — E876 Hypokalemia: Secondary | ICD-10-CM | POA: Diagnosis not present

## 2016-02-28 DIAGNOSIS — Z0000 Encounter for general adult medical examination without abnormal findings: Secondary | ICD-10-CM

## 2016-02-28 LAB — CBC WITH DIFFERENTIAL/PLATELET
Basophils Absolute: 0.1 10*3/uL (ref 0.0–0.1)
Basophils Relative: 0.9 % (ref 0.0–3.0)
Eosinophils Absolute: 0.1 10*3/uL (ref 0.0–0.7)
Eosinophils Relative: 2.1 % (ref 0.0–5.0)
HCT: 43.7 % (ref 36.0–46.0)
Hemoglobin: 14.6 g/dL (ref 12.0–15.0)
Lymphocytes Relative: 31.1 % (ref 12.0–46.0)
Lymphs Abs: 2 10*3/uL (ref 0.7–4.0)
MCHC: 33.4 g/dL (ref 30.0–36.0)
MCV: 87.8 fl (ref 78.0–100.0)
Monocytes Absolute: 0.4 10*3/uL (ref 0.1–1.0)
Monocytes Relative: 6.8 % (ref 3.0–12.0)
Neutro Abs: 3.8 10*3/uL (ref 1.4–7.7)
Neutrophils Relative %: 59.1 % (ref 43.0–77.0)
Platelets: 284 10*3/uL (ref 150.0–400.0)
RBC: 4.97 Mil/uL (ref 3.87–5.11)
RDW: 14 % (ref 11.5–15.5)
WBC: 6.4 10*3/uL (ref 4.0–10.5)

## 2016-02-28 LAB — BASIC METABOLIC PANEL
BUN: 16 mg/dL (ref 6–23)
CO2: 35 mEq/L — ABNORMAL HIGH (ref 19–32)
Calcium: 9.8 mg/dL (ref 8.4–10.5)
Chloride: 101 mEq/L (ref 96–112)
Creatinine, Ser: 0.81 mg/dL (ref 0.40–1.20)
GFR: 94.08 mL/min (ref >60.00)
Glucose, Bld: 95 mg/dL (ref 70–99)
Potassium: 3.9 mEq/L (ref 3.5–5.1)
Sodium: 143 mEq/L (ref 135–145)

## 2016-02-28 LAB — POC URINALSYSI DIPSTICK (AUTOMATED)
Glucose, UA: NEGATIVE
Nitrite, UA: NEGATIVE
Spec Grav, UA: 1.02
Urobilinogen, UA: 1
pH, UA: 5.5

## 2016-02-28 LAB — LIPID PANEL
Cholesterol: 246 mg/dL — ABNORMAL HIGH (ref 0–200)
HDL: 50.9 mg/dL (ref >39.00)
LDL Cholesterol: 177 mg/dL — ABNORMAL HIGH (ref 0–99)
NonHDL: 195.34
Total CHOL/HDL Ratio: 5
Triglycerides: 93 mg/dL (ref 0.0–149.0)
VLDL: 18.6 mg/dL (ref 0.0–40.0)

## 2016-02-28 LAB — HEPATIC FUNCTION PANEL
ALT: 11 U/L (ref 0–35)
AST: 13 U/L (ref 0–37)
Albumin: 4.2 g/dL (ref 3.5–5.2)
Alkaline Phosphatase: 88 U/L (ref 39–117)
Bilirubin, Direct: 0.1 mg/dL (ref 0.0–0.3)
Total Bilirubin: 0.4 mg/dL (ref 0.2–1.2)
Total Protein: 6.5 g/dL (ref 6.0–8.3)

## 2016-02-28 LAB — MICROALBUMIN / CREATININE URINE RATIO
Creatinine,U: 411.2 mg/dL
Microalb Creat Ratio: 0.5 mg/g (ref 0.0–30.0)
Microalb, Ur: 2 mg/dL — ABNORMAL HIGH (ref 0.0–1.9)

## 2016-02-28 LAB — HEMOGLOBIN A1C: Hgb A1c MFr Bld: 6.3 % (ref 4.6–6.5)

## 2016-02-28 LAB — TSH: TSH: 3.72 u[IU]/mL (ref 0.35–4.50)

## 2016-03-06 ENCOUNTER — Ambulatory Visit (INDEPENDENT_AMBULATORY_CARE_PROVIDER_SITE_OTHER): Payer: Managed Care, Other (non HMO) | Admitting: Family Medicine

## 2016-03-06 ENCOUNTER — Encounter: Payer: Self-pay | Admitting: Family Medicine

## 2016-03-06 VITALS — BP 122/84 | HR 86 | Temp 98.0°F | Ht 63.0 in | Wt 200.7 lb

## 2016-03-06 DIAGNOSIS — J4550 Severe persistent asthma, uncomplicated: Secondary | ICD-10-CM

## 2016-03-06 DIAGNOSIS — E785 Hyperlipidemia, unspecified: Secondary | ICD-10-CM | POA: Diagnosis not present

## 2016-03-06 DIAGNOSIS — R739 Hyperglycemia, unspecified: Secondary | ICD-10-CM

## 2016-03-06 DIAGNOSIS — K219 Gastro-esophageal reflux disease without esophagitis: Secondary | ICD-10-CM | POA: Diagnosis not present

## 2016-03-06 DIAGNOSIS — N3941 Urge incontinence: Secondary | ICD-10-CM

## 2016-03-06 DIAGNOSIS — F988 Other specified behavioral and emotional disorders with onset usually occurring in childhood and adolescence: Secondary | ICD-10-CM

## 2016-03-06 DIAGNOSIS — K449 Diaphragmatic hernia without obstruction or gangrene: Secondary | ICD-10-CM

## 2016-03-06 MED ORDER — MONTELUKAST SODIUM 10 MG PO TABS: ORAL_TABLET | ORAL | 4 refills | Status: DC

## 2016-03-06 MED ORDER — TOLTERODINE TARTRATE ER 4 MG PO CP24: ORAL_CAPSULE | ORAL | 4 refills | Status: DC

## 2016-03-06 MED ORDER — AMPHETAMINE-DEXTROAMPHET ER 20 MG PO CP24: ORAL_CAPSULE | ORAL | 0 refills | Status: DC

## 2016-03-06 MED ORDER — HYDROCHLOROTHIAZIDE 25 MG PO TABS: 25.0000 mg | ORAL_TABLET | Freq: Every morning | ORAL | 4 refills | Status: DC

## 2016-03-06 MED ORDER — METFORMIN HCL ER 500 MG PO TB24: 500.0000 mg | ORAL_TABLET | Freq: Every day | ORAL | 4 refills | Status: DC

## 2016-03-06 NOTE — Progress Notes (Signed)
Carrie [name] is a 55 year old married female nonsmoker............. I been taking care of person she was 55 years of age... Who comes in today for annual physical examination because of numerous issues  Her primary underlying health problems been the chronic asthma since age 55. She's been hospitalized many times has been on ventilators because of respiratory failure. Currently she is doing well. We had tried tapering her medications but were never able to get her below 10 mg of prednisone daily. This fall she had a flare. We instituted the help of pulmonary. She's done well with change the way she uses her inhaler and she feels that's made a significant improvement. She's now down to 5 mg of prednisone a day. She's due to go back to pulmonary in January 2018 for follow-up  She takes Adderall 20 mg long-acting dose 2 tabs daily because of adult ADD.  She also takes Pepcid and/or proton X for chronic reflux. She takes metformin 500 mg daily for diabetes. Blood sugars in the 110 range recent A1c 6.3%. Her weight is down 14 pounds. She's been working hard on her diet and exercise.  She takes had a core thiazide 25 mg daily because of fluid retention.  She's also on Singulair for allergic rhinitis.  She gets routine eye care, dental care, colonoscopy 06/19/2014 was normal. Her mother died of colon cancer. Last mammogram 2013-06-18 encouraged annual mammography. She had her uterus removed in Jun 18, 2005 for fibroids. Ovaries were left intact. Therefore pelvic exams not indicated.  She tells me she had a scan of her abdomen this fall because of abdominal pain. She says that showed an area of fat that became necrotic. Pain resolved over time. Blood pressure is normal  Vaccinations up-to-date  Physical examinationBP 122/84 (BP Location: Left Arm, Patient Position: Sitting, Cuff Size: Large)   Pulse 86   Temp 98 F (36.7 C) (Oral)   Ht 5\' 3"  (1.6 m)   Wt 200 lb 11.2 oz (91 kg)   BMI 35.55 kg/m  Examination of the HEENT were  negative except she wears glasses neck was supple no adenopathy thyroid normal cardiopulmonary exam normal breast exam normal abdominal exam normal extremities normal skin no peripheral pulses normal  #1 asthma under much improved control with help from pulmonary............ continue program as outlined by them  #2 obesity......... continue diet exercise and weight loss  #3 adult ADD....... 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 continue the metformin 500 mg before breakfast  #5 reflux esophagitis........ continue proton X  #6 urinary incontinence......Marland Kitchen on 8 mg a day she is continent.... Continue 8 mg daily

## 2016-03-06 NOTE — Progress Notes (Signed)
Pre visit review using our clinic review tool, if applicable. No additional management support is needed unless otherwise documented below in the visit note. 

## 2016-03-06 NOTE — Patient Instructions (Addendum)
Adderall 20 Xr........... 2 capsules daily  Detrol 4 mg......... 2 capsules daily  Continue other medicines  Continue diet exercise and weight loss program  Follow-up fasting labs the first week in May........... I will call you the report  Call 2 weeks from being out of your third prescription for Adderall for refills.......... leave [place] a message on my chart

## 2016-03-29 ENCOUNTER — Ambulatory Visit: Payer: Managed Care, Other (non HMO) | Admitting: Internal Medicine

## 2016-04-01 ENCOUNTER — Other Ambulatory Visit: Payer: Self-pay | Admitting: Internal Medicine

## 2016-04-02 ENCOUNTER — Other Ambulatory Visit: Payer: Self-pay | Admitting: Internal Medicine

## 2016-04-09 LAB — LIPID PANEL
Cholesterol: 215 mg/dL — AB (ref 0–200)
HDL: 49 mg/dL (ref 35–70)
LDL Cholesterol: 146 mg/dL
Triglycerides: 101 mg/dL (ref 40–160)

## 2016-04-09 LAB — BASIC METABOLIC PANEL
BUN: 9 mg/dL (ref 4–21)
Creatinine: 0.7 mg/dL (ref <=1.1)
Glucose: 84 mg/dL
Potassium: 4 mmol/L (ref 3.4–5.3)
Sodium: 142 mmol/L (ref 137–147)

## 2016-04-09 LAB — HEMOGLOBIN A1C: Hemoglobin A1C: 6.1

## 2016-04-16 ENCOUNTER — Ambulatory Visit (INDEPENDENT_AMBULATORY_CARE_PROVIDER_SITE_OTHER): Payer: Managed Care, Other (non HMO) | Admitting: Internal Medicine

## 2016-04-16 ENCOUNTER — Encounter: Payer: Self-pay | Admitting: Internal Medicine

## 2016-04-16 VITALS — BP 120/86 | HR 95 | Ht 63.0 in | Wt 191.0 lb

## 2016-04-16 DIAGNOSIS — J4530 Mild persistent asthma, uncomplicated: Secondary | ICD-10-CM

## 2016-04-16 NOTE — Patient Instructions (Addendum)
Take 5 mg one half daily x 2 weeks then one half even days x 2 weeks then one half every 3 days x 2 weeks and stop.  If get worse nausea/ loss of appetite at any point go back up to higher dose  and alternate doses even/odd days  Please schedule a follow up office visit in 6 weeks, call sooner if needed

## 2016-04-16 NOTE — Progress Notes (Signed)
Subjective:     Patient ID: Carrie [name], female   DOB: 06-Apr-1960,    MRN: [id number]     Brief patient profile:  69 yobf never smoker onset asthma age 56 and placed on shots for "everything that grows" needed to stay on inhalers / complicated by 3 admits requiring ventilator last time around age 51 and overall much better since then but intermittently on prednisone and as of 2011 required daily prednisone so referred to pulmonary clinic 02/13/2016 by Dr   Sherren Mocha    History of Present Illness  02/13/2016 1st [facility] office visit/ Carrie [name]  maint rx qvar 80 2bid/ singiulair/ pred 10  Chief Complaint  Patient presents with  . Pulmonary Consult    Referred by Dr. Sherren Mocha for eval of Asthma. Pt states that she has been on prednisone for years for her asthma. She is currently not having any symptoms.   for the last six years only comes off prednisone for a few days then back on it due to wheeze/ sob s much cough and presently on 10 mg daily and qvar 80 2 every 12 hours and no saba x sev weeks   Apparently doesn't taper below 10 in any increment Has intermittent hb on prn ppi  rec Pantoprazole (protonix) 40 mg   Take  30-60 min before first meal of the day and Pepcid (famotidine)  20 mg one @  bedtime until return to office    Qvar 80 Take 2 puffs first thing in am and then another 2 puffs about 12 hours later.  Work on inhaler technique:     Only use your albuterol as a rescue medication  Try Prednisone 5 mg daily x 2 weeks then  5 alternating with 2.5 mg (one-half pill) - if worse resume previous dose     04/16/2016  f/u ov/Konstantine Gervasi re: steroid dep asthma with pred down to 5 mg daily / qvar 80 2bid / singulair /gerd rx  Chief Complaint  Patient presents with  . Follow-up    Breathing is overall doing well. She c/o minimal congestion "behind my nose"- just started today. She is on pred 5 mg daily and has not went any lower than that. She has not had to use her proair.    Not limited  by breathing from desired activities  / no saba use at all  Some nausea at the lower doses of pred but very happy with wt loss on the lower doses  No obvious day to day or daytime variability in any of her symptoms  or assoc excess/ purulent sputum or mucus plugs or hemoptysis or cp or chest tightness, subjective wheeze or overt sinus or hb symptoms. No unusual exp hx or h/o childhood pna/ asthma or knowledge of premature birth.  Sleeping ok without nocturnal  or early am exacerbation  of respiratory  c/o's or need for noct saba. Also denies any obvious fluctuation of symptoms with weather or environmental changes or other aggravating or alleviating factors except as outlined above   Current Medications, Allergies, Complete Past Medical History, Past Surgical History, Family History, and Social History were reviewed in [employer] record.  ROS  The following are not active complaints unless bolded sore throat, dysphagia, dental problems, itching, sneezing,  nasal congestion or excess/ purulent secretions, ear ache,   fever, chills, sweats, unintended wt loss, classically pleuritic or exertional cp,  orthopnea pnd or leg swelling, presyncope, palpitations, abdominal pain, anorexia, nausea, vomiting, diarrhea  or  change in bowel or bladder habits, change in stools or urine, dysuria,hematuria,  rash, arthralgias, visual complaints, headache, numbness, weakness or ataxia or problems with walking or coordination,  change in mood/affect or memory.                    Objective:   Physical Exam    Pleasant amb obese bf not overtly cushingnoid  04/16/2016       191   02/13/16 203 lb 3.2 oz (92.2 kg)  02/05/16 200 lb (90.7 kg)  01/24/16 200 lb (90.7 kg)    Vital signs reviewed - Note on arrival 02 sats  95% on RA     HEENT: nl dentition, turbinates, and oropharynx. Nl external ear canals without cough reflex   NECK :  without JVD/Nodes/TM/ nl carotid upstrokes  bilaterally   LUNGS: no acc muscle use,  Nl contour chest which is clear to A and P bilaterally without cough on insp or exp maneuvers   CV:  RRR  no s3 or murmur or increase in P2, nad no edema   ABD:  soft and nontender with nl inspiratory excursion in the supine position. No bruits or organomegaly appreciated, bowel sounds nl  MS:  Nl gait/ ext warm without deformities, calf tenderness, cyanosis or clubbing No obvious joint restrictions   SKIN: warm and dry without lesions    NEURO:  alert, approp, nl sensorium with  no motor or cerebellar deficits apparent.         Assessment:

## 2016-04-17 NOTE — Assessment & Plan Note (Addendum)
Spirometry 02/13/2016  Nl x mid flows reduced to 56% and min curvature  pre - saba on qvar 80 2bid and pred 10 mg daily   All goals of chronic asthma control met including optimal function and elimination of symptoms with minimal need for rescue therapy.  Contingencies discussed in full including contacting this office immediately if not controlling the symptoms using the rule of two's.     The goal with a chronic steroid dependent illness is always arriving at the lowest effective dose that controls the disease/symptoms and not accepting a set "formula" which is based on statistics or guidelines that don't always take into account patient  variability or the natural hx of the dz in every individual patient, which may well vary over time.  For now therefore I recommend the patient slowly try to taper toward 2.5 mg qod and see if HPA recovers adequately and if not change to Hydrocortisone rx next or consider endocrine referral.  I had an extended discussion with the patient reviewing all relevant studies completed to date and  lasting 15 to 20 minutes of a 25 minute visit    Each maintenance medication was reviewed in detail including most importantly the difference between maintenance and prns and under what circumstances the prns are to be triggered using an action plan format that is not reflected in the computer generated alphabetically organized AVS.    Please see AVS for specific instructions unique to this visit that I personally wrote and verbalized to the the pt in detail and then reviewed with pt  by my nurse highlighting any  changes in therapy recommended at today's visit to their plan of care.

## 2016-04-29 ENCOUNTER — Other Ambulatory Visit: Payer: Self-pay | Admitting: Internal Medicine

## 2016-05-07 ENCOUNTER — Encounter: Payer: Self-pay | Admitting: Family Medicine

## 2016-05-07 DIAGNOSIS — M2550 Pain in unspecified joint: Secondary | ICD-10-CM

## 2016-05-08 ENCOUNTER — Ambulatory Visit (INDEPENDENT_AMBULATORY_CARE_PROVIDER_SITE_OTHER): Payer: Self-pay | Admitting: Orthopedic Surgery

## 2016-05-23 LAB — TSH: TSH: 3.09 u[IU]/mL (ref <=5.90)

## 2016-05-29 ENCOUNTER — Ambulatory Visit: Payer: Managed Care, Other (non HMO) | Admitting: Internal Medicine

## 2016-06-26 ENCOUNTER — Ambulatory Visit (INDEPENDENT_AMBULATORY_CARE_PROVIDER_SITE_OTHER): Payer: Managed Care, Other (non HMO) | Admitting: Internal Medicine

## 2016-06-26 ENCOUNTER — Encounter: Payer: Self-pay | Admitting: Internal Medicine

## 2016-06-26 VITALS — BP 124/92 | HR 119 | Ht 63.0 in | Wt 188.6 lb

## 2016-06-26 DIAGNOSIS — R05 Cough: Secondary | ICD-10-CM | POA: Diagnosis not present

## 2016-06-26 DIAGNOSIS — J4520 Mild intermittent asthma, uncomplicated: Secondary | ICD-10-CM | POA: Diagnosis not present

## 2016-06-26 DIAGNOSIS — R058 Other specified cough: Secondary | ICD-10-CM

## 2016-06-26 NOTE — Progress Notes (Signed)
Subjective:     Patient ID: Carrie [name], female   DOB: [birth year],    MRN: [id number]     Brief patient profile:  84 yobf never smoker onset asthma age 56 and placed on shots for "everything that grows" needed to stay on inhalers / complicated by 3 admits requiring ventilator last time around age 35 and overall much better since then but intermittently on prednisone and as of 2011 required daily prednisone so referred to pulmonary clinic 02/13/2016 by Dr   Julianne Rice shots but off  Around 2000    History of Present Illness  02/13/2016 1st [facility] office visit/ Carrie [name]  maint rx qvar 80 2bid/ singiulair/ pred 10  Chief Complaint  Patient presents with  . Pulmonary Consult    Referred by Dr. Sherren Mocha for eval of Asthma. Pt states that she has been on prednisone for years for her asthma. She is currently not having any symptoms.   for the last six years only comes off prednisone for a few days then back on it due to wheeze/ sob s much cough and presently on 10 mg daily and qvar 80 2 every 12 hours and no saba x sev weeks   Apparently doesn't taper below 10 in any increment Has intermittent hb on prn ppi  rec Pantoprazole (protonix) 40 mg   Take  30-60 min before first meal of the day and Pepcid (famotidine)  20 mg one @  bedtime until return to office    Qvar 80 Take 2 puffs first thing in am and then another 2 puffs about 12 hours later.  Work on inhaler technique:     Only use your albuterol as a rescue medication  Try Prednisone 5 mg daily x 2 weeks then  5 alternating with 2.5 mg (one-half pill) - if worse resume previous dose     04/16/2016  f/u ov/Carrie [name] re: steroid dep asthma with pred down to 5 mg daily / qvar 80 2bid / singulair /gerd rx  Chief Complaint  Patient presents with  . Follow-up    Breathing is overall doing well. She c/o minimal congestion "behind my nose"- just started today. She is on pred 5 mg daily and has not went any lower than that. She has not had  to use her proair.   Not limited by breathing from desired activities  / no saba use at all  Some nausea at the lower doses of pred but very happy with wt loss on the lower doses rec Take 5 mg one half daily x 2 weeks then one half even days x 2 weeks then one half every 3 days x 2 weeks and stop. If get worse nausea/ loss of appetite at any point go back up to higher dose  and alternate doses even/odd days    06/26/2016  f/u ov/Carrie [name] re:  Chronic  Asthma / on qvar 80 2bid / singulair/ gerd rx  and cortef per Suzette Battiest for adrenal insuff Chief Complaint  Patient presents with  . Follow-up    Pt c/o hoarseness from the pollen. Pt denies cough, wheezing, and SOB, CP/tightness.     saba once a week at most / some daytime only  throat clearing but o/w doing fine  Not limited by breathing from desired activities    No obvious day to day or daytime variability or assoc excess/ purulent sputum or mucus plugs or hemoptysis or cp or chest tightness, subjective wheeze or overt sinus or hb  symptoms. No unusual exp hx or h/o childhood pna/ asthma or knowledge of premature birth.  Sleeping ok without nocturnal  or early am exacerbation  of respiratory  c/o's or need for noct saba. Also denies any obvious fluctuation of symptoms with weather or environmental changes or other aggravating or alleviating factors except as outlined above   Current Medications, Allergies, Complete Past Medical History, Past Surgical History, Family History, and Social History were reviewed in [employer] record.  ROS  The following are not active complaints unless bolded sore throat, dysphagia, dental problems, itching, sneezing,  nasal congestion or excess/ purulent secretions, ear ache,   fever, chills, sweats, unintended wt loss, classically pleuritic or exertional cp,  orthopnea pnd or leg swelling, presyncope, palpitations, abdominal pain, anorexia, nausea, vomiting, diarrhea  or change in bowel or  bladder habits, change in stools or urine, dysuria,hematuria,  rash, arthralgias, visual complaints, headache, numbness, weakness or ataxia or problems with walking or coordination,  change in mood/affect or memory.                Objective:   Physical Exam    Pleasant amb obese bf  freq throat clearing    06/26/2016        188  04/16/2016       191   02/13/16 203 lb 3.2 oz (92.2 kg)  02/05/16 200 lb (90.7 kg)  01/24/16 200 lb (90.7 kg)    Vital signs reviewed - Note on arrival 02 sats  97% on RA     HEENT: nl dentition, turbinates, and oropharynx. Nl external ear canals without cough reflex   NECK :  without JVD/Nodes/TM/ nl carotid upstrokes bilaterally   LUNGS: no acc muscle use,  Nl contour chest which is clear to A and P bilaterally without cough on insp or exp maneuvers   CV:  RRR  no s3 or murmur or increase in P2, nad no edema   ABD:  soft and nontender with nl inspiratory excursion in the supine position. No bruits or organomegaly appreciated, bowel sounds nl  MS:  Nl gait/ ext warm without deformities, calf tenderness, cyanosis or clubbing No obvious joint restrictions   SKIN: warm and dry without lesions    NEURO:  alert, approp, nl sensorium with  no motor or cerebellar deficits apparent.         Assessment:

## 2016-06-26 NOTE — Patient Instructions (Addendum)
GERD (REFLUX)  is an extremely common cause of respiratory symptoms just like yours , many times with no obvious heartburn at all.    It can be treated with medication, but also with lifestyle changes including elevation of the head of your bed (ideally with 6 inch  bed blocks),  Smoking cessation, avoidance of late meals, excessive alcohol, and avoid fatty foods, chocolate, peppermint, colas, red wine, and acidic juices such as orange juice.  NO MINT OR MENTHOL PRODUCTS SO NO COUGH DROPS   USE SUGARLESS CANDY INSTEAD (Jolley ranchers or Stover's or Life Savers) or even ice chips will also do - the key is to swallow to prevent all throat clearing. NO OIL BASED VITAMINS - use powdered substitutes.    Plan A = Automatic = Qvar 80 Take 2 puffs first thing in am and then another 2 puffs about 12 hours later.    Plan B = Backup Only use your albuterol as a rescue medication to be used if you can't catch your breath by resting or doing a relaxed purse lip breathing pattern.  - The less you use it, the better it will work when you need it. - Ok to use the inhaler up to 2 puffs  every 4 hours if you must but call for appointment if use goes up over your usual need - Don't leave home without it !!  (think of it like the spare tire for your car)   Plan C = Crisis - only use your albuterol nebulizer if you first try Plan B and it fails to help > ok to use the nebulizer up to every 4 hours but if start needing it regularly call for immediate appointment   Please schedule a follow up visit in 6  months but call sooner if needed

## 2016-06-27 DIAGNOSIS — R058 Other specified cough: Secondary | ICD-10-CM | POA: Insufficient documentation

## 2016-06-27 DIAGNOSIS — R05 Cough: Secondary | ICD-10-CM | POA: Insufficient documentation

## 2016-06-27 NOTE — Assessment & Plan Note (Signed)
Body mass index is 33.41 kg/m.  trending down now off prednisone, encouraged Lab Results  Component Value Date   TSH 3.72 02/28/2016     Contributing to gerd risk/ doe/reviewed the need and the process to achieve and maintain neg calorie balance > defer f/u primary care including intermittently monitoring thyroid status

## 2016-06-27 NOTE — Assessment & Plan Note (Signed)
Throat clearing is most c/w Upper airway cough syndrome (previously labeled PNDS) , is  so named because it's frequently impossible to sort out how much is  CR/sinusitis with freq throat clearing (which can be related to primary GERD)   vs  causing  secondary (" extra esophageal")  GERD from wide swings in gastric pressure that occur with throat clearing, often  promoting self use of mint and menthol lozenges that reduce the lower esophageal sphincter tone and exacerbate the problem further in a cyclical fashion.   These are the same pts (now being labeled as having "irritable larynx syndrome" by some cough centers) who not infrequently have a history of having failed to tolerate ace inhibitors,  dry powder inhalers (can be seen with any ics, qvar the least) or biphosphonates or report having atypical/extraesophageal reflux symptoms that don't respond to standard doses of PPI  and are easily confused as having aecopd or asthma flares by even experienced allergists/ pulmonologists (myself included).   Already on max rx for gerd, advised re use hard rock candy and avoiding mint, menthol products.  If persists and asthma well controlled would taper qvar to 40 2bid and if still an issue consider a trial of gabapentin for irritable larynx

## 2016-06-27 NOTE — Assessment & Plan Note (Addendum)
Spirometry 02/13/2016  Nl x mid flows reduced to 56% and min curvature  pre - saba on qvar 80 2bid and pred 10 mg daily > tapered off as of 06/26/2016 only on physiologic cortef - 06/26/2016  After extensive coaching HFA effectiveness =    90% from baseline 75%   All goals of chronic asthma control met including optimal function and elimination of symptoms with minimal need for rescue therapy.  Contingencies discussed in full including contacting this office immediately if not controlling the symptoms using the rule of two's.     I had an extended discussion with the patient reviewing all relevant studies completed to date and  lasting 15 to 20 minutes of a 25 minute visit    Each maintenance medication was reviewed in detail including most importantly the difference between maintenance and prns and under what circumstances the prns are to be triggered using an action plan format that is not reflected in the computer generated alphabetically organized AVS.    Please see AVS for specific instructions unique to this visit that I personally wrote and verbalized to the the pt in detail and then reviewed with pt  by my nurse highlighting any  changes in therapy recommended at today's visit to their plan of care.

## 2016-06-29 ENCOUNTER — Other Ambulatory Visit: Payer: Self-pay | Admitting: Family Medicine

## 2016-07-01 MED ORDER — AMPHETAMINE-DEXTROAMPHET ER 20 MG PO CP24: 40.0000 mg | ORAL_CAPSULE | Freq: Every day | ORAL | 0 refills | Status: DC

## 2016-07-17 ENCOUNTER — Other Ambulatory Visit: Payer: Managed Care, Other (non HMO)

## 2016-07-25 ENCOUNTER — Encounter: Payer: Self-pay | Admitting: Family Medicine

## 2016-08-26 ENCOUNTER — Emergency Department (HOSPITAL_BASED_OUTPATIENT_CLINIC_OR_DEPARTMENT_OTHER)
Admission: EM | Admit: 2016-08-26 | Discharge: 2016-08-26 | Disposition: A | Discharge status: Home / Self Care | Payer: Managed Care, Other (non HMO) | Attending: Emergency Medicine | Admitting: Emergency Medicine

## 2016-08-26 ENCOUNTER — Encounter (HOSPITAL_BASED_OUTPATIENT_CLINIC_OR_DEPARTMENT_OTHER): Payer: Self-pay

## 2016-08-26 DIAGNOSIS — M25511 Pain in right shoulder: Secondary | ICD-10-CM | POA: Diagnosis present

## 2016-08-26 DIAGNOSIS — M7541 Impingement syndrome of right shoulder: Secondary | ICD-10-CM | POA: Insufficient documentation

## 2016-08-26 DIAGNOSIS — Z79899 Other long term (current) drug therapy: Secondary | ICD-10-CM | POA: Diagnosis not present

## 2016-08-26 DIAGNOSIS — J45909 Unspecified asthma, uncomplicated: Secondary | ICD-10-CM | POA: Diagnosis not present

## 2016-08-26 DIAGNOSIS — E119 Type 2 diabetes mellitus without complications: Secondary | ICD-10-CM | POA: Insufficient documentation

## 2016-08-26 HISTORY — DX: Type 2 diabetes mellitus without complications: E11.9

## 2016-08-26 HISTORY — DX: Disorder of thyroid, unspecified: E07.9

## 2016-08-26 HISTORY — DX: Disorder of adrenal gland, unspecified: E27.9

## 2016-08-26 MED ORDER — MELOXICAM 15 MG PO TABS: 15.0000 mg | ORAL_TABLET | Freq: Every day | ORAL | 0 refills | Status: DC | PRN

## 2016-08-26 MED ORDER — TRAMADOL HCL 50 MG PO TABS: 50.0000 mg | ORAL_TABLET | Freq: Four times a day (QID) | ORAL | 0 refills | Status: DC | PRN

## 2016-08-26 NOTE — ED Notes (Signed)
ED Provider at bedside. 

## 2016-08-26 NOTE — ED Provider Notes (Signed)
[facility] Provider Note   CSN: [id number] Arrival date & time: 08/26/16  1158  By signing my name below, I, Reola Mosher, attest that this documentation has been prepared under the direction and in the presence of Virgel Manifold, MD. Electronically Signed: Reola Mosher, ED Scribe. 08/26/16. 12:15 PM.  History   Chief Complaint Chief Complaint  Patient presents with  . Shoulder Pain   The history is provided by the patient. No language interpreter was used.   HPI Comments: Carrie [name] is a 56 y.o. female with a h/o fibromyalgia, obesity, DM, and A-fib, who presents to the Emergency Department complaining of persistent, moderate right shoulder pain beginning yesterday, acutely worsening this morning. She notes associated posterior neck pain and radiation of her shoulder pain downward into her right wrist. Pt also notes paraesthesias into the right hand as well. Her pain is alleviated with resting the arm by her side, and her pain is precipitated and exacerbated with ROM of the joint. She also notes that her neck pain felt acutely worsened with ambulating into the ED. She has taken Tylenol at home this morning without relief of her pain. No recent trauma, injury, or new activity to precipitate her pain. She denies numbness, weakness, or any other associated symptoms.   Past Medical History:  Diagnosis Date  . A-fib ([place])   . ADD (attention deficit disorder)   . Adrenal abnormality ([place])   . Anemia, iron deficiency   . Asthma   . Diabetes mellitus without complication ([place])   . Fibromyalgia   . Hiatal hernia   . Obese   . Thyroid disease    Patient Active Problem List   Diagnosis Date Noted  . Upper airway cough syndrome 06/27/2016  . Morbid obesity due to excess calories ([place]) 02/14/2016  . Elevated blood sugar 01/17/2016  . Family history of colon cancer 06/14/2014  . Gastroesophageal reflux disease with hiatal hernia 06/02/2014  . Pain  of left shoulder region 06/03/2013  . Incontinence 02/05/2011  . UNSPECIFIED INTERNAL DERANGEMENT OF KNEE 02/01/2010  . DEGENERATIVE DISC DISEASE, CERVICAL SPINE, W/RADICULOPATHY 04/21/2009  . Attention deficit disorder 11/30/2007  . Asthma 01/13/2007   Past Surgical History:  Procedure Laterality Date  . ABDOMINAL HYSTERECTOMY    . KNEE SURGERY    . SHOULDER SURGERY     OB History    No data available     Home Medications    Prior to Admission medications   Medication Sig Start Date End Date Taking? Authorizing Provider  albuterol (PROVENTIL) (2.5 MG/3ML) 0.083% nebulizer solution USE 1 VIAL VIA NEBULIZER EVERY 4 HOURS AS NEEDED FOR WHEEZE/SHORTNESS OF BREATH 02/04/14   Dorena Cookey, MD  amphetamine-dextroamphetamine (ADDERALL XR) 20 MG 24 hr capsule Take 2 capsules (40 mg total) by mouth daily. 07/01/16   Dorena Cookey, MD  amphetamine-dextroamphetamine (ADDERALL XR) 20 MG 24 hr capsule Take 2 capsules (40 mg total) by mouth daily. 07/01/16   Dorena Cookey, MD  amphetamine-dextroamphetamine (ADDERALL XR) 20 MG 24 hr capsule Take 2 capsules (40 mg total) by mouth daily. 07/01/16   Dorena Cookey, MD  beclomethasone (QVAR) 80 MCG/ACT inhaler Inhale 2 puffs into the lungs 2 (two) times daily. 01/15/16   Dorena Cookey, MD  famotidine (PEPCID) 20 MG tablet One at bedtime 02/13/16   Tanda Rockers, MD  hydrochlorothiazide (HYDRODIURIL) 25 MG tablet Take 1 tablet (25 mg total) by mouth every morning. 03/06/16   Dorena Cookey,  MD  hydrocortisone (CORTEF) 5 MG tablet Take 5 mg by mouth daily.    [provider]  levothyroxine (SYNTHROID, LEVOTHROID) 50 MCG tablet Take 50 mcg by mouth daily before breakfast.    [provider]  metFORMIN (GLUCOPHAGE-XR) 500 MG 24 hr tablet Take 1 tablet (500 mg total) by mouth daily with breakfast. 03/06/16   Dorena Cookey, MD  montelukast (SINGULAIR) 10 MG tablet TAKE 1 TABLET AT BEDTIME (NEED PHYSICAL) 03/06/16   Dorena Cookey, MD  pantoprazole (PROTONIX) 40 MG tablet TAKE 1 TABLET (40 MG TOTAL) BY MOUTH DAILY. 04/29/16   Tanda Rockers, MD  PROAIR HFA 108 [phone] Base) MCG/ACT inhaler USE 2 INHALATIONS EVERY 6 HOURS AS NEEDED 02/12/16   Dorena Cookey, MD  tolterodine (DETROL LA) 4 MG 24 hr capsule 2 capsules daily 03/06/16   Dorena Cookey, MD   Family History Family History  Problem Relation Age of Onset  . Asthma Mother   . Colon cancer Mother   . Lung cancer Maternal Uncle   . Esophageal cancer Father        smoker, mets to lungs  . Diabetes Sister    Social History Social History  Substance Use Topics  . Smoking status: Never Smoker  . Smokeless tobacco: Never Used  . Alcohol use Yes     Comment: occ   Allergies   Aspirin; Codeine phosphate; Influenza vaccines; and Penicillins  Review of Systems Review of Systems  Musculoskeletal: Positive for arthralgias, myalgias and neck pain.  Neurological: Negative for weakness and numbness.  All other systems reviewed and are negative.  Physical Exam Updated Vital Signs BP 132/77 (BP Location: Left Arm)   Pulse 94   Temp 98.4 F (36.9 C) (Oral)   Resp 20   Ht 5' 2.5" (1.588 m)   Wt 178 lb 6 oz (80.9 kg)   SpO2 98%   BMI 32.11 kg/m   Physical Exam  Constitutional: She appears well-developed and well-nourished.  HENT:  Head: Normocephalic.  Right Ear: External ear normal.  Left Ear: External ear normal.  Nose: Nose normal.  Eyes: Conjunctivae are normal. Right eye exhibits no discharge. Left eye exhibits no discharge.  Neck: Normal range of motion.  Cardiovascular: Normal rate.   Pulmonary/Chest: Effort normal.  Abdominal: She exhibits no distension.  Musculoskeletal: She exhibits tenderness. She exhibits no edema.  Point tender under right acromion. Cannot abduct shoulder to 90 degrees w/o pain. Positive Neer's test. She is NVI.   Neurological: She is alert. No cranial nerve deficit. Coordination normal.  Skin: Skin is warm and dry. No  rash noted. No erythema. No pallor.  Psychiatric: She has a normal mood and affect. Her behavior is normal.  Nursing note and vitals reviewed.  ED Treatments / Results  DIAGNOSTIC STUDIES: Oxygen Saturation is 98% on RA, normal by my interpretation.   COORDINATION OF CARE: 12:15 PM-Discussed next steps with pt. Pt verbalized understanding and is agreeable with the plan.   Labs (all labs ordered are listed, but only abnormal results are displayed) Labs Reviewed - No data to display  EKG  EKG Interpretation None      Radiology No results found.  Procedures Procedures   Medications Ordered in ED Medications - No data to display  Initial Impression / Assessment and Plan / ED Course  I have reviewed the triage vital signs and the nursing notes.  Pertinent labs & imaging results that were available during my care of the patient  were reviewed by me and considered in my medical decision making (see chart for details).     56yF with R shoulder pain. No discrete trauma. Suspect impingement. Point tender under acromion and positive Neer's test. PRN NSAIDs. Avoid activities which exacerbate pain. Ortho FU.   Final Clinical Impressions(s) / ED Diagnoses   Final diagnoses:  Shoulder impingement, right   New Prescriptions New Prescriptions   No medications on file   I personally preformed the services scribed in my presence. The recorded information has been reviewed is accurate. Virgel Manifold, MD.     Virgel Manifold, MD 08/27/16 [phone]

## 2016-08-26 NOTE — ED Triage Notes (Addendum)
C/o right shoulder pain yesterday-posterior neck pain x today-pain worse with movement-denies recent injury-NAD-steady gait

## 2016-10-25 ENCOUNTER — Telehealth: Payer: Self-pay

## 2016-10-25 NOTE — Telephone Encounter (Signed)
Spoke with pt in regards to her QVAR inhaler which [facility] is requesting an alternative inhaler since the QVAR is no longer manufactured, pt stated that she is no longer using this medication and that she will call the pharmacy with the update so they can stop requesting for more refills.

## 2016-10-29 ENCOUNTER — Other Ambulatory Visit: Payer: Self-pay | Admitting: Internal Medicine

## 2016-10-31 ENCOUNTER — Encounter: Payer: Self-pay | Admitting: Internal Medicine

## 2016-11-01 ENCOUNTER — Other Ambulatory Visit: Payer: Self-pay

## 2016-11-01 MED ORDER — PANTOPRAZOLE SODIUM 40 MG PO TBEC: 40.0000 mg | DELAYED_RELEASE_TABLET | Freq: Every day | ORAL | 3 refills | Status: DC

## 2016-11-01 MED ORDER — FAMOTIDINE 20 MG PO TABS: ORAL_TABLET | ORAL | 3 refills | Status: DC

## 2016-11-15 ENCOUNTER — Encounter: Payer: Self-pay | Admitting: Family Medicine

## 2016-11-20 ENCOUNTER — Other Ambulatory Visit: Payer: Self-pay | Admitting: Family Medicine

## 2016-11-20 ENCOUNTER — Encounter: Payer: Self-pay | Admitting: *Deleted

## 2016-11-20 ENCOUNTER — Encounter: Payer: Self-pay | Admitting: Family Medicine

## 2016-11-20 MED ORDER — AMPHETAMINE-DEXTROAMPHET ER 20 MG PO CP24: 40.0000 mg | ORAL_CAPSULE | Freq: Every day | ORAL | 0 refills | Status: DC

## 2016-11-20 NOTE — Telephone Encounter (Signed)
I did write for a one month supply

## 2016-11-26 ENCOUNTER — Encounter: Payer: Self-pay | Admitting: Family Medicine

## 2016-11-26 ENCOUNTER — Ambulatory Visit (INDEPENDENT_AMBULATORY_CARE_PROVIDER_SITE_OTHER): Payer: Managed Care, Other (non HMO) | Admitting: Family Medicine

## 2016-11-26 VITALS — BP 148/98 | HR 104 | Temp 98.3°F | Wt 175.0 lb

## 2016-11-26 DIAGNOSIS — R202 Paresthesia of skin: Secondary | ICD-10-CM | POA: Diagnosis not present

## 2016-11-26 DIAGNOSIS — R200 Anesthesia of skin: Secondary | ICD-10-CM | POA: Insufficient documentation

## 2016-11-26 NOTE — Patient Instructions (Addendum)
We will set you up a neurologic consult ASAP  Your last abdominal scan November 2017 showed the hemangiomas in your liver were unchanged

## 2016-11-26 NOTE — Progress Notes (Signed)
Carrie [name] is a 56 year old married [name] nonsmoker who comes in today for evaluation of multiple medical problems  She most recently has been seen Dr. Suzette Battiest and has been diagnosed with the following problems. Type 2 diabetes. Initially her weight was 213 pounds. She's got her weight down to 175. Blood sugars are normal A1c now at 6.1. She is on metformin 500 mg daily  She's also been diagnosed with hypothyroidism on Synthroid 50 g daily last TSH level was normal. She was diagnosed with hyperlipidemia but stopped put on medication. We discussed the issue with having diabetes and been put on statins. She get a fasting lipid panel later this month by Dr. Zenia Resides. Her asthma is well controlled off steroids on Singulair Qvar and Pro air.  She has a hiatal hernia for which she takes Pristiq on Nexium Pepcid. Biggest problem now she's having in which she's here to see [place] about the day sleep dysfunction. She wakes up in her arms are numb and tingly. She can't take any anti-inflammatories because she is allergic to aspirin. She was told to take Tylenol but it doesn't help.  She was also diagnosed by me many years ago to have a sister liver. Last scan was 2014. She's due for follow-up liver scan.  BP (!) 148/98 (BP Location: Left Arm, Patient Position: Sitting, Cuff Size: Normal)   Pulse (!) 104   Temp 98.3 F (36.8 C) (Oral)   Wt 175 lb (79.4 kg)   BMI Carrie.50 kg/m  In general she is a well-developed well-nourished [name] no acute distress examination the neck was normal. One time she was told she had a pinched nerve in her neck. Her peripheral pulses are normal and do not disappear when I raise her arms.  #1 sleep dysfunction etiology unknown......... question cervical disc disease........ neuro consult  #2 diabetes type 2 at goal.......... continue current therapy  #3 hypothyroidism........ normal TSH on medication continue above dose  #4 asthma under good control........ continue current treatment  program  #5 overweight............ continue diet exercise and weight loss  #6 history of hyperlipidemia.......Marland Kitchen recommend a fasting lipid panel now that her blood sugars normal. Goal for LDL is below 75. If not would recommend a low-dose statin.  #7 cystic lesion in her liver.......Marland Kitchen reimage.

## 2016-11-29 ENCOUNTER — Ambulatory Visit: Payer: Managed Care, Other (non HMO) | Admitting: Neurology

## 2016-12-03 ENCOUNTER — Encounter: Payer: Self-pay | Admitting: Family Medicine

## 2016-12-03 ENCOUNTER — Other Ambulatory Visit: Payer: Self-pay | Admitting: Internal Medicine

## 2016-12-04 ENCOUNTER — Other Ambulatory Visit: Payer: Self-pay

## 2016-12-04 DIAGNOSIS — M62838 Other muscle spasm: Secondary | ICD-10-CM

## 2016-12-04 MED ORDER — CYCLOBENZAPRINE HCL 10 MG PO TABS: 10.0000 mg | ORAL_TABLET | Freq: Every day | ORAL | 0 refills | Status: DC

## 2016-12-05 NOTE — Telephone Encounter (Signed)
Rx for Flexeril was sent to pt pharmacy for refill, called pt to check on how she was doing left an answer for pt to return my call in the office.

## 2016-12-06 ENCOUNTER — Encounter: Payer: Self-pay | Admitting: Family Medicine

## 2016-12-27 ENCOUNTER — Ambulatory Visit (INDEPENDENT_AMBULATORY_CARE_PROVIDER_SITE_OTHER): Payer: Managed Care, Other (non HMO) | Admitting: Internal Medicine

## 2016-12-27 ENCOUNTER — Encounter: Payer: Self-pay | Admitting: Internal Medicine

## 2016-12-27 VITALS — BP 126/72 | HR 109 | Ht 62.5 in | Wt 173.0 lb

## 2016-12-27 DIAGNOSIS — J4530 Mild persistent asthma, uncomplicated: Secondary | ICD-10-CM | POA: Diagnosis not present

## 2016-12-27 NOTE — Progress Notes (Signed)
Subjective:     Patient ID: Carrie [name], female   DOB: 1961-01-10,    MRN: [id number]     Brief patient profile:  78 yobf never smoker onset asthma age 56 and placed on shots for "everything that grows" needed to stay on inhalers / complicated by 3 admits requiring ventilator last time around age 26 and overall much better since then but intermittently on prednisone and as of 2011 required daily prednisone so referred to pulmonary clinic 02/13/2016 by Dr   Julianne Rice shots but off  Around 2000    History of Present Illness  02/13/2016 1st [facility] office visit/ Dea Bitting  maint rx qvar 80 2bid/ singiulair/ pred 10  Chief Complaint  Patient presents with  . Pulmonary Consult    Referred by Dr. Sherren Mocha for eval of Asthma. Pt states that she has been on prednisone for years for her asthma. She is currently not having any symptoms.   for the last six years only comes off prednisone for a few days then back on it due to wheeze/ sob s much cough and presently on 10 mg daily and qvar 80 2 every 12 hours and no saba x sev weeks   Apparently doesn't taper below 10 in any increment Has intermittent hb on prn ppi  rec Pantoprazole (protonix) 40 mg   Take  30-60 min before first meal of the day and Pepcid (famotidine)  20 mg one @  bedtime until return to office    Qvar 80 Take 2 puffs first thing in am and then another 2 puffs about 12 hours later.  Work on inhaler technique:     Only use your albuterol as a rescue medication  Try Prednisone 5 mg daily x 2 weeks then  5 alternating with 2.5 mg (one-half pill) - if worse resume previous dose     04/16/2016  f/u ov/Grady Mohabir re: steroid dep asthma with pred down to 5 mg daily / qvar 80 2bid / singulair /gerd rx  Chief Complaint  Patient presents with  . Follow-up    Breathing is overall doing well. She c/o minimal congestion "behind my nose"- just started today. She is on pred 5 mg daily and has not went any lower than that. She has not had  to use her proair.   Not limited by breathing from desired activities  / no saba use at all  Some nausea at the lower doses of pred but very happy with wt loss on the lower doses rec Take 5 mg one half daily x 2 weeks then one half even days x 2 weeks then one half every 3 days x 2 weeks and stop. If get worse nausea/ loss of appetite at any point go back up to higher dose  and alternate doses even/odd days    06/26/2016  f/u ov/Ryett Hamman re:  Chronic  Asthma / on qvar 80 2bid / singulair/ gerd rx  and cortef per Suzette Battiest for adrenal insuff Chief Complaint  Patient presents with  . Follow-up    Pt c/o hoarseness from the pollen. Pt denies cough, wheezing, and SOB, CP/tightness.    saba once a week at most / some daytime only  throat clearing but o/w doing fine rec GERD diet  Plan A = Automatic = Qvar 80 Take 2 puffs first thing in am and then another 2 puffs about 12 hours later.  Plan B = Backup Only use your albuterol as a rescue   Plan  C = Crisis - only use your albuterol nebulizer if you first try Plan B and it fails to help > ok to use the nebulizer up to every 4 hours but if start needing it regularly call for immediate appointment    12/27/2016  f/u ov/Asheton Viramontes re: chronic asthma / on qvar 80 2bid / singulair  Chief Complaint  Patient presents with  . Follow-up    Breathing is doing well and no new co's today. She states she has not had to use her rescue inhaler or neb.   main issue is muscle aches when tapers off prednisone, not sob or cough  Not limited by breathing from desired activities    No obvious day to day or daytime variability or assoc excess/ purulent sputum or mucus plugs or hemoptysis or cp or chest tightness, subjective wheeze or overt sinus or hb symptoms. No unusual exp hx or h/o childhood pna/ asthma or knowledge of premature birth.  Sleeping ok flat without nocturnal  or early am exacerbation  of respiratory  c/o's or need for noct saba. Also denies any obvious  fluctuation of symptoms with weather or environmental changes or other aggravating or alleviating factors except as outlined above   Current Allergies, Complete Past Medical History, Past Surgical History, Family History, and Social History were reviewed in [employer] record.  ROS  The following are not active complaints unless bolded Hoarseness, sore throat, dysphagia, dental problems, itching, sneezing,  nasal congestion or discharge of excess mucus or purulent secretions, ear ache,   fever, chills, sweats, unintended wt loss or wt gain, classically pleuritic or exertional cp,  orthopnea pnd or leg swelling, presyncope, palpitations, abdominal pain, anorexia, nausea, vomiting, diarrhea  or change in bowel habits or change in bladder habits, change in stools or change in urine, dysuria, hematuria,  rash, arthralgias, visual complaints, headache, numbness, weakness or ataxia or problems with walking or coordination,  change in mood/affect or memory.        Current Meds  Medication Sig  . albuterol (PROVENTIL) (2.5 MG/3ML) 0.083% nebulizer solution USE 1 VIAL VIA NEBULIZER EVERY 4 HOURS AS NEEDED FOR WHEEZE/SHORTNESS OF BREATH  . amphetamine-dextroamphetamine (ADDERALL XR) 20 MG 24 hr capsule Take 2 capsules (40 mg total) by mouth daily.  . beclomethasone (QVAR) 80 MCG/ACT inhaler Inhale 2 puffs into the lungs 2 (two) times daily.  . cyclobenzaprine (FLEXERIL) 10 MG tablet Take 1 tablet (10 mg total) by mouth at bedtime.  . famotidine (PEPCID) 20 MG tablet One at bedtime  . hydrochlorothiazide (HYDRODIURIL) 25 MG tablet Take 1 tablet (25 mg total) by mouth every morning.  Marland Kitchen levothyroxine (SYNTHROID, LEVOTHROID) 50 MCG tablet Take 50 mcg by mouth daily before breakfast.  . metFORMIN (GLUCOPHAGE-XR) 500 MG 24 hr tablet Take 1 tablet (500 mg total) by mouth daily with breakfast.  . montelukast (SINGULAIR) 10 MG tablet TAKE 1 TABLET AT BEDTIME (NEED PHYSICAL)  . pantoprazole  (PROTONIX) 40 MG tablet Take 1 tablet (40 mg total) by mouth daily.  Marland Kitchen PROAIR HFA 108 (90 Base) MCG/ACT inhaler USE 2 INHALATIONS EVERY 6 HOURS AS NEEDED  . tolterodine (DETROL LA) 4 MG 24 hr capsule 2 capsules daily                  Objective:   Physical Exam    Pleasant amb obese bf  All smiles   12/27/2016     173  06/26/2016        188  04/16/2016  191   02/13/16 203 lb 3.2 oz (92.2 kg)  02/05/16 200 lb (90.7 kg)  01/24/16 200 lb (90.7 kg)    Vital signs reviewed - Note on arrival 02 sats  98% on RA     HEENT: nl dentition, turbinates, and oropharynx. Nl external ear canals without cough reflex   NECK :  without JVD/Nodes/TM/ nl carotid upstrokes bilaterally   LUNGS: no acc muscle use,  Nl contour chest which is clear to A and P bilaterally without cough on insp or exp maneuvers   CV:  RRR  no s3 or murmur or increase in P2, nad no edema   ABD:  soft and nontender with nl inspiratory excursion in the supine position. No bruits or organomegaly appreciated, bowel sounds nl  MS:  Nl gait/ ext warm without deformities, calf tenderness, cyanosis or clubbing No obvious joint restrictions   SKIN: warm and dry without lesions    NEURO:  alert, approp, nl sensorium with  no motor or cerebellar deficits apparent.            Assessment:

## 2016-12-27 NOTE — Assessment & Plan Note (Signed)
Spirometry 02/13/2016  Nl x mid flows reduced to 56% and min curvature  pre - saba on qvar 80 2bid and pred 10 mg daily > tapered off as of 06/26/2016 only on physiologic steroids per dr Christeen Douglas guidance  - 12/27/2016  After extensive coaching HFA effectiveness =    90%  >  continue qvar 80 2bid   All goals of chronic asthma control met including optimal function and elimination of symptoms with minimal need for rescue therapy on qvar 80 2bid/ singulair/ gerd rx and physiologic pred to prevent w/d symptoms per Dr Suzette Battiest.  Contingencies discussed in full including contacting this office immediately if not controlling the symptoms using the rule of two's.     Each maintenance medication was reviewed in detail including most importantly the difference between maintenance and as needed and under what circumstances the prns are to be used.  Please see AVS for specific  Instructions which are unique to this visit and I personally typed out  which were reviewed in detail in writing with the patient and a copy provided.

## 2016-12-27 NOTE — Patient Instructions (Signed)
No change medications    Please schedule a follow up visit in 6 months but call sooner if needed  

## 2016-12-30 ENCOUNTER — Other Ambulatory Visit: Payer: Self-pay | Admitting: Family Medicine

## 2016-12-30 DIAGNOSIS — M62838 Other muscle spasm: Secondary | ICD-10-CM

## 2017-01-01 ENCOUNTER — Encounter: Payer: Self-pay | Admitting: Family Medicine

## 2017-01-02 ENCOUNTER — Encounter: Payer: Self-pay | Admitting: Family Medicine

## 2017-01-03 ENCOUNTER — Other Ambulatory Visit: Payer: Self-pay

## 2017-01-03 MED ORDER — AMPHETAMINE-DEXTROAMPHET ER 20 MG PO CP24: 40.0000 mg | ORAL_CAPSULE | Freq: Every day | ORAL | 0 refills | Status: DC

## 2017-01-03 NOTE — Telephone Encounter (Signed)
Rx for Adderall was signed and patient is aware to pick it up at the desk.

## 2017-01-03 NOTE — Telephone Encounter (Signed)
Done

## 2017-01-15 ENCOUNTER — Encounter: Payer: Self-pay | Admitting: Neurology

## 2017-01-15 ENCOUNTER — Ambulatory Visit (INDEPENDENT_AMBULATORY_CARE_PROVIDER_SITE_OTHER): Payer: Managed Care, Other (non HMO) | Admitting: Neurology

## 2017-01-15 VITALS — BP 126/86 | HR 87 | Ht 62.5 in | Wt 176.0 lb

## 2017-01-15 DIAGNOSIS — R202 Paresthesia of skin: Secondary | ICD-10-CM

## 2017-01-15 DIAGNOSIS — R531 Weakness: Secondary | ICD-10-CM

## 2017-01-15 DIAGNOSIS — R200 Anesthesia of skin: Secondary | ICD-10-CM | POA: Diagnosis not present

## 2017-01-15 MED ORDER — DULOXETINE HCL 60 MG PO CPEP: 60.0000 mg | ORAL_CAPSULE | Freq: Every day | ORAL | 12 refills | Status: DC

## 2017-01-15 NOTE — Progress Notes (Signed)
PATIENT: Carrie [name] DOB: [birth year]  Chief Complaint  Patient presents with  . Numbness    She is here with her husband, Remo Lipps.  Reports numbness/tingling her her bilateral arms causing her difficulty sleeping.  She has been taking Prednisone daily for 30+ for asthma.  She is currenlty alternating 5mg  one day and 10mg  the next.  She noticed the symptoms when she attempted to taper off the steroids.  Marland Kitchen PCP    Dorena Cookey, MD     HISTORICAL  Carrie [name]  is a 56 year old female, seen in refer by his primary care doctor  Stevie Kern for evaluation of numbness, initial evaluation was January 15 2017.  I reviewed and summarized the referring note, she has history of ADHD, atrial fibrillation, diabetes, fibromyalgia, asthma.  Because of her chronic asthma, breathing problem, she had been treated with prednisone for many years, recently tapered off prednisone 20 mg every day in July 2018, shortly after she stopped prednisone treatment, she noticed right shoulder deep achy pain, later spreading to bilateral shoulder, arm, anterior thigh, deep achy pain, numbness tingling, could not sleep, feel muscle strain, bilateral hip weakness, difficulty standing up,  She was put back on prednisone in October 2018 now taking 5 mg alternating with 10 mg, shortly afterwards, her symptoms disappeared, she is also taking Flexeril 10 mg at bedtime, which is helpful.  Laboratory evaluation March 2018, normal CMP, CBC, mild elevated LDL 146, A1c 6.1, TSH.   REVIEW OF SYSTEMS: Full 14 system review of systems performed and notable only for as above  ALLERGIES: Allergies  Allergen Reactions  . Aspirin     REACTION: asthma- on respirator for 28 days  . Codeine Phosphate     REACTION: hives  . Influenza Vaccines     Soreness, increased asthma, side pain, trouble walking  . Penicillins     REACTION: wheezing and hives    HOME MEDICATIONS: Current Outpatient Prescriptions    Medication Sig Dispense Refill  . albuterol (PROVENTIL) (2.5 MG/3ML) 0.083% nebulizer solution USE 1 VIAL VIA NEBULIZER EVERY 4 HOURS AS NEEDED FOR WHEEZE/SHORTNESS OF BREATH 75 mL 6  . amphetamine-dextroamphetamine (ADDERALL XR) 20 MG 24 hr capsule Take 2 capsules (40 mg total) by mouth daily. 60 capsule 0  . beclomethasone (QVAR) 80 MCG/ACT inhaler Inhale 2 puffs into the lungs 2 (two) times daily. 1 Inhaler 1  . cyclobenzaprine (FLEXERIL) 10 MG tablet TAKE 1 TABLET BY MOUTH EVERYDAY AT BEDTIME 60 tablet 5  . famotidine (PEPCID) 20 MG tablet One at bedtime 90 tablet 3  . hydrochlorothiazide (HYDRODIURIL) 25 MG tablet Take 1 tablet (25 mg total) by mouth every morning. 100 tablet 4  . levothyroxine (SYNTHROID, LEVOTHROID) 50 MCG tablet Take 50 mcg by mouth daily before breakfast.    . metFORMIN (GLUCOPHAGE-XR) 500 MG 24 hr tablet Take 1 tablet (500 mg total) by mouth daily with breakfast. 100 tablet 4  . montelukast (SINGULAIR) 10 MG tablet TAKE 1 TABLET AT BEDTIME (NEED PHYSICAL) 100 tablet 4  . pantoprazole (PROTONIX) 40 MG tablet Take 1 tablet (40 mg total) by mouth daily. 90 tablet 3  . PREDNISONE PO Alternating days with 5mg  one day and 10mg  the next day.    Marland Kitchen PROAIR HFA 108 (90 Base) MCG/ACT inhaler USE 2 INHALATIONS EVERY 6 HOURS AS NEEDED 25.5 g 3  . tolterodine (DETROL LA) 4 MG 24 hr capsule 2 capsules daily 200 capsule 4   No current facility-administered medications for this  visit.     PAST MEDICAL HISTORY: Past Medical History:  Diagnosis Date  . A-fib ([place])   . ADD (attention deficit disorder)   . Adrenal abnormality ([place])   . Anemia, iron deficiency   . Asthma   . Diabetes mellitus without complication ([place])   . Fibromyalgia   . Hiatal hernia   . Numbness and tingling   . Obese   . Thyroid disease     PAST SURGICAL HISTORY: Past Surgical History:  Procedure Laterality Date  . ABDOMINAL HYSTERECTOMY    . KNEE SURGERY    . SHOULDER SURGERY      FAMILY  HISTORY: Family History  Problem Relation Age of Onset  . Asthma Mother        seasonal  . Colon cancer Mother   . Lung cancer Maternal Uncle   . Esophageal cancer Father        smoker, mets to lungs  . Diabetes Sister     SOCIAL HISTORY:  Social History   Social History  . Marital status: Married    Spouse name: N/A  . Number of children: 4  . Years of education: College   Occupational History  . human resources    Social History Main Topics  . Smoking status: Never Smoker  . Smokeless tobacco: Never Used  . Alcohol use Yes     Comment: occ  . Drug use: No  . Sexual activity: Not on file   Other Topics Concern  . Not on file   Social History Narrative   Lives at home with husband.   Right-handed.   1 cup caffeine per day.     PHYSICAL EXAM   Vitals:   01/15/17 0807  BP: 126/86  Pulse: 87  Weight: 176 lb (79.8 kg)  Height: 5' 2.5" (1.588 m)    Not recorded      Body mass index is 31.68 kg/m.  PHYSICAL EXAMNIATION:  Gen: NAD, conversant, well nourised, obese, well groomed                     Cardiovascular: Regular rate rhythm, no peripheral edema, warm, nontender. Eyes: Conjunctivae clear without exudates or hemorrhage Neck: Supple, no carotid bruits. Pulmonary: Clear to auscultation bilaterally   NEUROLOGICAL EXAM:  MENTAL STATUS: Speech:    Speech is normal; fluent and spontaneous with normal comprehension.  Cognition:     Orientation to time, place and person     Normal recent and remote memory     Normal Attention span and concentration     Normal Language, naming, repeating,spontaneous speech     Fund of knowledge   CRANIAL NERVES: CN II: Visual fields are full to confrontation. Fundoscopic exam is normal with sharp discs and no vascular changes. Pupils are round equal and briskly reactive to light. CN III, IV, VI: extraocular movement are normal. No ptosis. CN V: Facial sensation is intact to pinprick in all 3 divisions  bilaterally. Corneal responses are intact.  CN VII: Face is symmetric with normal eye closure and smile. CN VIII: Hearing is normal to rubbing fingers CN IX, X: Palate elevates symmetrically. Phonation is normal. CN XI: Head turning and shoulder shrug are intact CN XII: Tongue is midline with normal movements and no atrophy.  MOTOR: There is no pronator drift of out-stretched arms. Muscle bulk and tone are normal. She has mild bilateral hip flexion weakness  REFLEXES: Reflexes are 2+ and symmetric at the biceps, triceps, knees, and ankles. Plantar  responses are flexor.  SENSORY: Intact to light touch, pinprick, positional sensation and vibratory sensation are intact in fingers and toes.  COORDINATION: Rapid alternating movements and fine finger movements are intact. There is no dysmetria on finger-to-nose and heel-knee-shin.    GAIT/STANCE: Posture is normal. Gait is steady with normal steps, base, arm swing, and turning. Heel and toe walking are normal. Tandem gait is normal.  Romberg is absent.   DIAGNOSTIC DATA (LABS, IMAGING, TESTING) - I reviewed patient records, labs, notes, testing and imaging myself where available.   ASSESSMENT AND PLAN  Fabiana Dromgoole Delgado-Utt is a 56 y.o. female   Paresthesia body achy pain  Laboratory evaluations to rule out inflammatory process,  EMG nerve conduction study  Cymbalta 60 mg every day  Decrease prednisone to 5 mg daily   Marcial Pacas, M.D. Ph.D.  [facility] [address] [place], [place] [address] Ph: [phone] Fax: [phone]  CC: Dorena Cookey, MD

## 2017-01-17 LAB — C-REACTIVE PROTEIN: CRP: 1.9 mg/L (ref 0.0–4.9)

## 2017-01-17 LAB — ENA+DNA/DS+SJORGEN'S
ENA RNP Ab: 0.2 AI (ref 0.0–0.9)
ENA SM Ab Ser-aCnc: 0.2 AI (ref 0.0–0.9)
ENA SSA (RO) Ab: <0.2 AI (ref 0.0–0.9)
ENA SSB (LA) Ab: <0.2 AI (ref 0.0–0.9)
dsDNA Ab: <1 IU/mL (ref 0–9)

## 2017-01-17 LAB — ANA W/REFLEX: Anti Nuclear Antibody(ANA): POSITIVE — AB

## 2017-01-17 LAB — RPR: RPR Ser Ql: NONREACTIVE

## 2017-01-17 LAB — CK: Total CK: 50 U/L (ref 24–173)

## 2017-01-17 LAB — VITAMIN D 25 HYDROXY (VIT D DEFICIENCY, FRACTURES): Vit D, 25-Hydroxy: 59.7 ng/mL (ref 30.0–100.0)

## 2017-01-17 LAB — FERRITIN: Ferritin: 80 ng/mL (ref 15–150)

## 2017-01-17 LAB — VITAMIN B12: Vitamin B-12: 602 pg/mL (ref 232–1245)

## 2017-01-17 LAB — SEDIMENTATION RATE: Sed Rate: 2 mm/hr (ref 0–40)

## 2017-01-17 LAB — COPPER, SERUM: Copper: 112 ug/dL (ref 72–166)

## 2017-01-29 ENCOUNTER — Ambulatory Visit (INDEPENDENT_AMBULATORY_CARE_PROVIDER_SITE_OTHER): Payer: Managed Care, Other (non HMO) | Admitting: Neurology

## 2017-01-29 DIAGNOSIS — R200 Anesthesia of skin: Secondary | ICD-10-CM | POA: Diagnosis not present

## 2017-01-29 DIAGNOSIS — R202 Paresthesia of skin: Secondary | ICD-10-CM

## 2017-01-29 DIAGNOSIS — R531 Weakness: Secondary | ICD-10-CM

## 2017-01-29 DIAGNOSIS — Z0289 Encounter for other administrative examinations: Secondary | ICD-10-CM

## 2017-01-29 DIAGNOSIS — G5603 Carpal tunnel syndrome, bilateral upper limbs: Secondary | ICD-10-CM

## 2017-01-29 MED ORDER — DULOXETINE HCL 60 MG PO CPEP: 60.0000 mg | ORAL_CAPSULE | Freq: Every day | ORAL | 4 refills | Status: DC

## 2017-01-29 MED ORDER — PREDNISONE 1 MG PO TABS: ORAL_TABLET | ORAL | 11 refills | Status: DC

## 2017-01-29 NOTE — Progress Notes (Signed)
PATIENT: Carrie [name] DOB: 12/26/1960  No chief complaint on file.    HISTORICAL  Carrie [name]  is a 56 year old female, seen in refer by his primary care doctor  Stevie Kern for evaluation of numbness, initial evaluation was January 15 2017.  I reviewed and summarized the referring note, she has history of ADHD, atrial fibrillation, diabetes, fibromyalgia, asthma.  Because of her chronic asthma, breathing problem, she had been treated with prednisone for many years, recently tapered off prednisone 20 mg every day in July 2018, shortly after she stopped prednisone treatment, she noticed right shoulder deep achy pain, later spreading to bilateral shoulder, arm, anterior thigh, deep achy pain, numbness tingling, could not sleep, feel muscle strain, bilateral hip weakness, difficulty standing up,  She was put back on prednisone in October 2018 now taking 5 mg alternating with 10 mg, shortly afterwards, her symptoms disappeared, she is also taking Flexeril 10 mg at bedtime, which is helpful.  Laboratory evaluation March 2018, normal CMP, CBC, mild elevated LDL 146, A1c 6.1, TSH.   UPDATE Jan 29 2017: Laboratory evaluation showed positive ANA, otherwise normal ferritin, CPK, vitamin D, B12, ESR C-reactive protein,    REVIEW OF SYSTEMS: Full 14 system review of systems performed and notable only for as above  ALLERGIES: Allergies  Allergen Reactions  . Aspirin     REACTION: asthma- on respirator for 28 days  . Codeine Phosphate     REACTION: hives  . Influenza Vaccines     Soreness, increased asthma, side pain, trouble walking  . Penicillins     REACTION: wheezing and hives    HOME MEDICATIONS: Current Outpatient Medications  Medication Sig Dispense Refill  . albuterol (PROVENTIL) (2.5 MG/3ML) 0.083% nebulizer solution USE 1 VIAL VIA NEBULIZER EVERY 4 HOURS AS NEEDED FOR WHEEZE/SHORTNESS OF BREATH 75 mL 6  . amphetamine-dextroamphetamine (ADDERALL XR)  20 MG 24 hr capsule Take 2 capsules (40 mg total) by mouth daily. 60 capsule 0  . beclomethasone (QVAR) 80 MCG/ACT inhaler Inhale 2 puffs into the lungs 2 (two) times daily. 1 Inhaler 1  . cyclobenzaprine (FLEXERIL) 10 MG tablet TAKE 1 TABLET BY MOUTH EVERYDAY AT BEDTIME 60 tablet 5  . DULoxetine (CYMBALTA) 60 MG capsule Take 1 capsule (60 mg total) by mouth daily. 30 capsule 12  . famotidine (PEPCID) 20 MG tablet One at bedtime 90 tablet 3  . hydrochlorothiazide (HYDRODIURIL) 25 MG tablet Take 1 tablet (25 mg total) by mouth every morning. 100 tablet 4  . levothyroxine (SYNTHROID, LEVOTHROID) 50 MCG tablet Take 50 mcg by mouth daily before breakfast.    . metFORMIN (GLUCOPHAGE-XR) 500 MG 24 hr tablet Take 1 tablet (500 mg total) by mouth daily with breakfast. 100 tablet 4  . montelukast (SINGULAIR) 10 MG tablet TAKE 1 TABLET AT BEDTIME (NEED PHYSICAL) 100 tablet 4  . pantoprazole (PROTONIX) 40 MG tablet Take 1 tablet (40 mg total) by mouth daily. 90 tablet 3  . PREDNISONE PO Alternating days with 35m one day and 128mthe next day.    . Marland KitchenROAIR HFA 108 (90 Base) MCG/ACT inhaler USE 2 INHALATIONS EVERY 6 HOURS AS NEEDED 25.5 g 3  . tolterodine (DETROL LA) 4 MG 24 hr capsule 2 capsules daily 200 capsule 4   No current facility-administered medications for this visit.     PAST MEDICAL HISTORY: Past Medical History:  Diagnosis Date  . A-fib ([facility]  . ADD (attention deficit disorder)   . [place]al abnormality ([place]  .  Anemia, iron deficiency   . Asthma   . Diabetes mellitus without complication ([place])   . Fibromyalgia   . Hiatal hernia   . Numbness and tingling   . Obese   . Thyroid disease     PAST SURGICAL HISTORY: Past Surgical History:  Procedure Laterality Date  . ABDOMINAL HYSTERECTOMY    . KNEE SURGERY    . SHOULDER SURGERY      FAMILY HISTORY: Family History  Problem Relation Age of Onset  . Asthma Mother        seasonal  . Colon cancer Mother   . Lung cancer Maternal  Uncle   . Esophageal cancer Father        smoker, mets to lungs  . Diabetes Sister     SOCIAL HISTORY:  Social History   Socioeconomic History  . Marital status: Married    Spouse name: Not on file  . Number of children: 4  . Years of education: College  . Highest education level: Not on file  Social Needs  . Financial resource strain: Not on file  . Food insecurity - worry: Not on file  . Food insecurity - inability: Not on file  . Transportation needs - medical: Not on file  . Transportation needs - non-medical: Not on file  Occupational History  . Occupation: human resources  Tobacco Use  . Smoking status: Never Smoker  . Smokeless tobacco: Never Used  Substance and Sexual Activity  . Alcohol use: Yes    Comment: occ  . Drug use: No  . Sexual activity: Not on file  Other Topics Concern  . Not on file  Social History Narrative   Lives at home with husband.   Right-handed.   1 cup caffeine per day.     PHYSICAL EXAM   There were no vitals filed for this visit.  Not recorded      There is no height or weight on file to calculate BMI.  PHYSICAL EXAMNIATION:  Gen: NAD, conversant, well nourised, obese, well groomed                     Cardiovascular: Regular rate rhythm, no peripheral edema, warm, nontender. Eyes: Conjunctivae clear without exudates or hemorrhage Neck: Supple, no carotid bruits. Pulmonary: Clear to auscultation bilaterally   NEUROLOGICAL EXAM:  MENTAL STATUS: Speech:    Speech is normal; fluent and spontaneous with normal comprehension.  Cognition:     Orientation to time, place and person     Normal recent and remote memory     Normal Attention span and concentration     Normal Language, naming, repeating,spontaneous speech     Fund of knowledge   CRANIAL NERVES: CN II: Visual fields are full to confrontation. Fundoscopic exam is normal with sharp discs and no vascular changes. Pupils are round equal and briskly reactive to  light. CN III, IV, VI: extraocular movement are normal. No ptosis. CN V: Facial sensation is intact to pinprick in all 3 divisions bilaterally. Corneal responses are intact.  CN VII: Face is symmetric with normal eye closure and smile. CN VIII: Hearing is normal to rubbing fingers CN IX, X: Palate elevates symmetrically. Phonation is normal. CN XI: Head turning and shoulder shrug are intact CN XII: Tongue is midline with normal movements and no atrophy.  MOTOR: There is no pronator drift of out-stretched arms. Muscle bulk and tone are normal. She has mild bilateral hip flexion weakness  REFLEXES: Reflexes are 2+ and  symmetric at the biceps, triceps, knees, and ankles. Plantar responses are flexor.  SENSORY: Intact to light touch, pinprick, positional sensation and vibratory sensation are intact in fingers and toes.  COORDINATION: Rapid alternating movements and fine finger movements are intact. There is no dysmetria on finger-to-nose and heel-knee-shin.    GAIT/STANCE: Posture is normal. Gait is steady with normal steps, base, arm swing, and turning. Heel and toe walking are normal. Tandem gait is normal.  Romberg is absent.   DIAGNOSTIC DATA (LABS, IMAGING, TESTING) - I reviewed patient records, labs, notes, testing and imaging myself where available.   ASSESSMENT AND PLAN  Carrie [name] is a 55 y.o. female   Paresthesia body achy pain  Laboratory evaluations to rule out inflammatory process,  EMG nerve conduction study  Cymbalta 60 mg every day  Decrease prednisone to 5 mg daily   Marcial Pacas, M.D. Ph.D.  [facility] [address] [place], [place] [address] Ph: [phone] Fax: [phone]  CC: Dorena Cookey, MD

## 2017-01-31 NOTE — Procedures (Signed)
Full Name: Carrie [name] Gender: Female MRN #: [id number] Date of Birth: 2060/11/15    Visit Date: 01/29/17 11:27 Age: 56 Years 16 Months Old Examining Physician: Marcial Pacas, MD  Referring Physician: Krista Blue, MD History: 56 year old female complains of diffuse body aching pain after tapering off long-term steroid use  Summary of the test:  Nerve conduction study: Bilateral median mixed response showed prolonged latency compared to the ipsilateral ulnar mixed response, right side is 1.0 millisecond prolonged, left side was 0.6 milliseconds prolonged. Left sural superficial peroneal sensory responses were normal. Right ulnar sensory response was normal.  Bilateral median, right ulnar, left peroneal, tibial motor responses were normal.  Electromyography: Selected needle examination of right upper, lower extremity muscles, right cervical and lumbar sacral paraspinal muscles was normal.  Conclusion:  This is a mild abnormal study. There is electrodiagnostic evidence of median neuropathy across the wrist, consistent with mild bilateral carpal tunnel syndromes, right worse than left. There is no evidence of large fiber peripheral neuropathy, or inflammatory myopathy.   ------------------------------- Marcial Pacas M.D.  [facility] [place], [place] [address] Tel: [phone] Fax: [phone]        [facility]    Nerve / Sites Muscle Latency Ref. Amplitude Ref. Rel Amp Segments Distance Velocity Ref. Area    ms ms mV mV %  cm m/s m/s mVms  R Median - APB     Wrist APB 3.8 ?4.4 10.7 ?4.0 100 Wrist - APB 7   35.1     Upper arm APB 7.3  10.5  98 Upper arm - Wrist 20 56 ?49 33.8  L Median - APB     Wrist APB 3.1 ?4.4 7.8 ?4.0 100 Wrist - APB 7   26.0     Upper arm APB 6.4  7.5  96.8 Upper arm - Wrist 20 60 ?49 26.1  R Ulnar - ADM     Wrist ADM 2.4 ?3.3 9.8 ?6.0 100 Wrist - ADM 7   34.8     B.Elbow ADM 5.7  9.2  94.4 B.Elbow - Wrist 20 61 ?49 34.2       A.Elbow ADM 7.8  8.9  96.7 A.Elbow - B.Elbow 12 58 ?49 33.9         A.Elbow - Wrist      L Peroneal - EDB     Ankle EDB 3.8 ?6.5 5.4 ?2.0 100 Ankle - EDB 9   19.2     Fib head EDB 10.0  5.0  91.9 Fib head - Ankle 32 52 ?44 18.5     Pop fossa EDB 11.7  4.6  92.4 Pop fossa - Fib head 10 58 ?44 16.9         Pop fossa - Ankle      L Tibial - AH     Ankle AH 3.4 ?5.8 18.3 ?4.0 100 Ankle - AH 9   36.9     Pop fossa AH 11.9  12.8  70 Pop fossa - Ankle 38 44 ?41 32.8               SNC    Nerve / Sites Rec. Site Peak Lat Ref.  Amp Ref. Segments Distance Peak Diff Ref.    ms ms V V  cm ms ms  L Sural - Ankle (Calf)     Calf Ankle 3.2 ?4.4 12 ?6 Calf - Ankle 14    L Superficial peroneal - Ankle  Lat leg Ankle 3.5 ?4.4 10 ?6 Lat leg - Ankle 14    R Median, Ulnar - Transcarpal comparison     Median Palm Wrist 2.8 ?2.2 77 ?35 Median Palm - Wrist 8       Ulnar Palm Wrist 1.8 ?2.2 34 ?12 Ulnar Palm - Wrist 8          Median Palm - Ulnar Palm  1.0 ?0.4  L Median, Ulnar - Transcarpal comparison     Median Palm Wrist 2.3 ?2.2 44 ?35 Median Palm - Wrist 8       Ulnar Palm Wrist 1.7 ?2.2 34 ?12 Ulnar Palm - Wrist 8          Median Palm - Ulnar Palm  0.6 ?0.4  R Median - Orthodromic (Dig II, Mid palm)     Dig II Wrist 3.7 ?3.4 17 ?10 Dig II - Wrist 13    L Median - Orthodromic (Dig II, Mid palm)     Dig II Wrist 3.4 ?3.4 17 ?10 Dig II - Wrist 13    R Ulnar - Orthodromic, (Dig V, Mid palm)     Dig V Wrist 2.5 ?3.1 16 ?5 Dig V - Wrist 26                     F  Wave    Nerve F Lat Ref.   ms ms  L Tibial - AH 45.3 ?56.0  R Ulnar - ADM 25.9 ?32.0         EMG full       EMG Summary Table    Spontaneous MUAP Recruitment  Muscle IA Fib PSW Fasc Other Amp Dur. Poly Pattern  R. Tibialis posterior Normal None None None _______ Normal Normal Normal Normal  R. Tibialis anterior Normal None None None _______ Normal Normal Normal Normal  R. Vastus lateralis Normal None None None _______ Normal  Normal Normal Normal  R. Lumbar paraspinals (mid) Normal None None None _______ Normal Normal Normal Normal  R. Lumbar paraspinals (low) Normal None None None _______ Normal Normal Normal Normal  R. Pronator teres Normal None None None _______ Normal Normal Normal Normal  R. Deltoid Normal None None None _______ Normal Normal Normal Normal  R. Biceps brachii Normal None None None _______ Normal Normal Normal Normal  R. Extensor digitorum communis Normal None None None _______ Normal Normal Normal Normal  R. Cervical paraspinals Normal None None None _______ Normal Normal Normal Normal

## 2017-02-13 ENCOUNTER — Other Ambulatory Visit: Payer: Self-pay

## 2017-02-13 ENCOUNTER — Encounter: Payer: Self-pay | Admitting: Family Medicine

## 2017-02-13 ENCOUNTER — Telehealth: Payer: Self-pay | Admitting: Family Medicine

## 2017-02-13 MED ORDER — AMPHETAMINE-DEXTROAMPHET ER 20 MG PO CP24: 40.0000 mg | ORAL_CAPSULE | Freq: Every day | ORAL | 0 refills | Status: DC

## 2017-02-13 NOTE — Telephone Encounter (Signed)
Patient was called and informed that Dr Burnice Logan refilled her adderall xr prescription and it is ready to be picked up at the front desk. Patient verbalized understanding.

## 2017-03-14 ENCOUNTER — Other Ambulatory Visit: Payer: Self-pay

## 2017-03-14 ENCOUNTER — Other Ambulatory Visit: Payer: Self-pay | Admitting: Family Medicine

## 2017-03-14 MED ORDER — HYDROCHLOROTHIAZIDE 25 MG PO TABS: 25.0000 mg | ORAL_TABLET | Freq: Every morning | ORAL | 1 refills | Status: DC

## 2017-03-20 ENCOUNTER — Encounter: Payer: Self-pay | Admitting: Family Medicine

## 2017-03-21 ENCOUNTER — Encounter: Payer: Self-pay | Admitting: Family Medicine

## 2017-03-24 ENCOUNTER — Other Ambulatory Visit: Payer: Self-pay

## 2017-03-24 ENCOUNTER — Telehealth: Payer: Self-pay

## 2017-03-24 MED ORDER — AMPHETAMINE-DEXTROAMPHET ER 20 MG PO CP24: 40.0000 mg | ORAL_CAPSULE | Freq: Every day | ORAL | 0 refills | Status: DC

## 2017-03-24 NOTE — Telephone Encounter (Signed)
Pt Rx for Adderral has been signed, printed and pt made aare through My Chart message to pick up Rx in the office

## 2017-04-23 ENCOUNTER — Other Ambulatory Visit: Payer: Self-pay | Admitting: Family Medicine

## 2017-04-24 ENCOUNTER — Encounter: Payer: Self-pay | Admitting: Family Medicine

## 2017-04-29 ENCOUNTER — Telehealth: Payer: Self-pay

## 2017-04-29 NOTE — Telephone Encounter (Signed)
Rx for Adderall is ready for pick up at the front desk, pt is aware spoke with pt and send a MyChart message

## 2017-04-30 ENCOUNTER — Telehealth: Payer: Self-pay

## 2017-04-30 ENCOUNTER — Other Ambulatory Visit: Payer: Self-pay

## 2017-04-30 MED ORDER — AMPHETAMINE-DEXTROAMPHET ER 20 MG PO CP24: 40.0000 mg | ORAL_CAPSULE | Freq: Every day | ORAL | 0 refills | Status: DC

## 2017-04-30 NOTE — Telephone Encounter (Signed)
Pt  Picked up Rx in the office this morning, three months supply was given per Dr Sherren Mocha

## 2017-06-02 ENCOUNTER — Other Ambulatory Visit: Payer: Self-pay | Admitting: Family Medicine

## 2017-06-03 ENCOUNTER — Other Ambulatory Visit: Payer: Self-pay

## 2017-06-03 MED ORDER — TOLTERODINE TARTRATE ER 4 MG PO CP24: ORAL_CAPSULE | ORAL | 1 refills | Status: DC

## 2017-07-01 ENCOUNTER — Ambulatory Visit: Payer: Managed Care, Other (non HMO) | Admitting: Internal Medicine

## 2017-07-15 ENCOUNTER — Other Ambulatory Visit: Payer: Self-pay | Admitting: Family Medicine

## 2017-07-31 ENCOUNTER — Ambulatory Visit (INDEPENDENT_AMBULATORY_CARE_PROVIDER_SITE_OTHER): Payer: Managed Care, Other (non HMO) | Admitting: Neurology

## 2017-07-31 ENCOUNTER — Encounter: Payer: Self-pay | Admitting: Neurology

## 2017-07-31 VITALS — BP 125/82 | HR 88 | Ht 62.5 in | Wt 190.5 lb

## 2017-07-31 DIAGNOSIS — M791 Myalgia, unspecified site: Secondary | ICD-10-CM | POA: Diagnosis not present

## 2017-07-31 MED ORDER — DULOXETINE HCL 60 MG PO CPEP: 60.0000 mg | ORAL_CAPSULE | Freq: Every day | ORAL | 4 refills | Status: DC

## 2017-07-31 NOTE — Progress Notes (Signed)
PATIENT: Carrie [name] DOB: 12-May-1960  No chief complaint on file.    HISTORICAL  Carrie [name]  is a 57 year old female, seen in refer by his primary care doctor  Stevie Kern for evaluation of numbness, initial evaluation was January 15 2017.  I reviewed and summarized the referring note, she has history of ADHD, atrial fibrillation, diabetes, fibromyalgia, asthma.  Because of her chronic asthma, breathing problem, she had been treated with prednisone for many years, recently tapered off prednisone 20 mg every day in July 2018, shortly after she stopped prednisone treatment, she noticed right shoulder deep achy pain, later spreading to bilateral shoulder, arm, anterior thigh, deep achy pain, numbness tingling, could not sleep, feel muscle strain, bilateral hip weakness, difficulty standing up,  She was put back on prednisone in October 2018 now taking 5 mg alternating with 10 mg, shortly afterwards, her symptoms disappeared, she is also taking Flexeril 10 mg at bedtime, which is helpful.  Laboratory evaluation March 2018, normal CMP, CBC, mild elevated LDL 146, A1c 6.1, TSH.   UPDATE Jul 31, 2017 Laboratory evaluation showed positive ANA, otherwise normal ferritin, CPK, vitamin D, B12, ESR C-reactive protein, negative auto antibodies  She was treated with Cymbalta since October 2018, responding well, rarely have any deep muscle achy pain, she was also able to successfully taper off the prednisone.  She denies significant muscle weakness.   REVIEW OF SYSTEMS: Full 14 system review of systems performed and notable only for as above  ALLERGIES: Allergies  Allergen Reactions  . Aspirin     REACTION: asthma- on respirator for 28 days  . Codeine Phosphate     REACTION: hives  . Influenza Vaccines     Soreness, increased asthma, side pain, trouble walking  . Penicillins     REACTION: wheezing and hives    HOME MEDICATIONS: Current Outpatient Medications    Medication Sig Dispense Refill  . albuterol (PROVENTIL) (2.5 MG/3ML) 0.083% nebulizer solution USE 1 VIAL VIA NEBULIZER EVERY 4 HOURS AS NEEDED FOR WHEEZE/SHORTNESS OF BREATH 75 mL 6  . amphetamine-dextroamphetamine (ADDERALL XR) 20 MG 24 hr capsule Take 2 capsules (40 mg total) by mouth daily. 60 capsule 0  . beclomethasone (QVAR) 80 MCG/ACT inhaler Inhale 2 puffs into the lungs 2 (two) times daily. 1 Inhaler 1  . cyclobenzaprine (FLEXERIL) 10 MG tablet TAKE 1 TABLET BY MOUTH EVERYDAY AT BEDTIME 60 tablet 5  . DULoxetine (CYMBALTA) 60 MG capsule Take 1 capsule (60 mg total) by mouth daily. 30 capsule 12  . famotidine (PEPCID) 20 MG tablet One at bedtime 90 tablet 3  . hydrochlorothiazide (HYDRODIURIL) 25 MG tablet Take 1 tablet (25 mg total) by mouth every morning. 100 tablet 4  . levothyroxine (SYNTHROID, LEVOTHROID) 50 MCG tablet Take 50 mcg by mouth daily before breakfast.    . metFORMIN (GLUCOPHAGE-XR) 500 MG 24 hr tablet Take 1 tablet (500 mg total) by mouth daily with breakfast. 100 tablet 4  . montelukast (SINGULAIR) 10 MG tablet TAKE 1 TABLET AT BEDTIME (NEED PHYSICAL) 100 tablet 4  . pantoprazole (PROTONIX) 40 MG tablet Take 1 tablet (40 mg total) by mouth daily. 90 tablet 3  . PREDNISONE PO Alternating days with 33m one day and 148mthe next day.    . Marland KitchenROAIR HFA 108 (90 Base) MCG/ACT inhaler USE 2 INHALATIONS EVERY 6 HOURS AS NEEDED 25.5 g 3  . tolterodine (DETROL LA) 4 MG 24 hr capsule 2 capsules daily 200 capsule 4   No  current facility-administered medications for this visit.     PAST MEDICAL HISTORY: Past Medical History:  Diagnosis Date  . A-fib ([place])   . ADD (attention deficit disorder)   . Adrenal abnormality ([place])   . Anemia, iron deficiency   . Asthma   . Diabetes mellitus without complication ([place])   . Fibromyalgia   . Hiatal hernia   . Numbness and tingling   . Obese   . Thyroid disease     PAST SURGICAL HISTORY: Past Surgical History:  Procedure  Laterality Date  . ABDOMINAL HYSTERECTOMY    . KNEE SURGERY    . SHOULDER SURGERY      FAMILY HISTORY: Family History  Problem Relation Age of Onset  . Asthma Mother        seasonal  . Colon cancer Mother   . Lung cancer Maternal Uncle   . Esophageal cancer Father        smoker, mets to lungs  . Diabetes Sister     SOCIAL HISTORY:  Social History   Socioeconomic History  . Marital status: Married    Spouse name: Not on file  . Number of children: 4  . Years of education: College  . Highest education level: Not on file  Social Needs  . Financial resource strain: Not on file  . Food insecurity - worry: Not on file  . Food insecurity - inability: Not on file  . Transportation needs - medical: Not on file  . Transportation needs - non-medical: Not on file  Occupational History  . Occupation: human resources  Tobacco Use  . Smoking status: Never Smoker  . Smokeless tobacco: Never Used  Substance and Sexual Activity  . Alcohol use: Yes    Comment: occ  . Drug use: No  . Sexual activity: Not on file  Other Topics Concern  . Not on file  Social History Narrative   Lives at home with husband.   Right-handed.   1 cup caffeine per day.     PHYSICAL EXAM   There were no vitals filed for this visit.  Not recorded      There is no height or weight on file to calculate BMI.  PHYSICAL EXAMNIATION:  Gen: NAD, conversant, well nourised, obese, well groomed                     Cardiovascular: Regular rate rhythm, no peripheral edema, warm, nontender. Eyes: Conjunctivae clear without exudates or hemorrhage Neck: Supple, no carotid bruits. Pulmonary: Clear to auscultation bilaterally   NEUROLOGICAL EXAM:  MENTAL STATUS: Speech:    Speech is normal; fluent and spontaneous with normal comprehension.  Cognition:     Orientation to time, place and person     Normal recent and remote memory     Normal Attention span and concentration     Normal Language, naming,  repeating,spontaneous speech     Fund of knowledge   CRANIAL NERVES: CN II: Visual fields are full to confrontation. Fundoscopic exam is normal with sharp discs and no vascular changes. Pupils are round equal and briskly reactive to light. CN III, IV, VI: extraocular movement are normal. No ptosis. CN V: Facial sensation is intact to pinprick in all 3 divisions bilaterally. Corneal responses are intact.  CN VII: Face is symmetric with normal eye closure and smile. CN VIII: Hearing is normal to rubbing fingers CN IX, X: Palate elevates symmetrically. Phonation is normal. CN XI: Head turning and shoulder shrug are intact CN  XII: Tongue is midline with normal movements and no atrophy.  MOTOR: There is no pronator drift of out-stretched arms. Muscle bulk and tone are normal. She has mild bilateral hip flexion weakness  REFLEXES: Reflexes are 2+ and symmetric at the biceps, triceps, knees, and ankles. Plantar responses are flexor.  SENSORY: Intact to light touch, pinprick, positional sensation and vibratory sensation are intact in fingers and toes.  COORDINATION: Rapid alternating movements and fine finger movements are intact. There is no dysmetria on finger-to-nose and heel-knee-shin.    GAIT/STANCE: Posture is normal. Gait is steady with normal steps, base, arm swing, and turning. Heel and toe walking are normal. Tandem gait is normal.  Romberg is absent.   DIAGNOSTIC DATA (LABS, IMAGING, TESTING) - I reviewed patient records, labs, notes, testing and imaging myself where available.   ASSESSMENT AND PLAN  Carrie [name] is a 57 y.o. female   Diffuse body achy pain  Laboratory evaluations showed no treatable etiology  EMG nerve conduction study showed no large fiber peripheral neuropathy or inflammatory myopathy,  Cymbalta 60 mg every day has been very helpful, refilled her prescription    Marcial Pacas, M.D. Ph.D.  [facility] [address], [place], [place] [address] Ph: [phone] Fax: [phone]  CC: Dorena Cookey, MD

## 2017-08-04 ENCOUNTER — Encounter: Payer: Self-pay | Admitting: Family Medicine

## 2017-08-05 ENCOUNTER — Other Ambulatory Visit: Payer: Self-pay

## 2017-08-05 ENCOUNTER — Telehealth: Payer: Self-pay

## 2017-08-05 MED ORDER — AMPHETAMINE-DEXTROAMPHETAMINE 20 MG PO TABS: ORAL_TABLET | ORAL | 0 refills | Status: DC

## 2017-08-05 MED ORDER — AMPHETAMINE-DEXTROAMPHET ER 20 MG PO CP24: 40.0000 mg | ORAL_CAPSULE | Freq: Every day | ORAL | 0 refills | Status: DC

## 2017-08-05 NOTE — Telephone Encounter (Signed)
Rx for Adderall was approved by Dr Raliegh Ip and patient is aware to pick up Rx at the office.

## 2017-08-05 NOTE — Telephone Encounter (Signed)
Okay to refill 3-month supply. 

## 2017-09-10 ENCOUNTER — Other Ambulatory Visit: Payer: Self-pay | Admitting: Family Medicine

## 2017-09-24 ENCOUNTER — Other Ambulatory Visit: Payer: Self-pay | Admitting: *Deleted

## 2017-09-24 MED ORDER — AMPHETAMINE-DEXTROAMPHET ER 20 MG PO CP24: 40.0000 mg | ORAL_CAPSULE | Freq: Every day | ORAL | 0 refills | Status: DC

## 2017-09-24 NOTE — Telephone Encounter (Signed)
Patient returned 2 of the hard copy scripts that were written on 08/05/17 because they were written for regular Adderall, not the XR capsules she normally gets. Patient requesting 2 new scripts for Adderall 20 mg XR. Rxs printed and signed by Dr. Raliegh Ip and given to patient. The returned rxs were shredded.

## 2017-10-20 ENCOUNTER — Other Ambulatory Visit: Payer: Self-pay | Admitting: Family Medicine

## 2017-11-07 ENCOUNTER — Other Ambulatory Visit: Payer: Self-pay | Admitting: Internal Medicine

## 2017-11-25 ENCOUNTER — Encounter: Payer: Self-pay | Admitting: Family Medicine

## 2017-12-01 ENCOUNTER — Other Ambulatory Visit: Payer: Self-pay | Admitting: Family Medicine

## 2017-12-01 MED ORDER — AMPHETAMINE-DEXTROAMPHET ER 20 MG PO CP24: 40.0000 mg | ORAL_CAPSULE | Freq: Every day | ORAL | 0 refills | Status: DC

## 2017-12-01 MED ORDER — AMPHETAMINE-DEXTROAMPHET ER 20 MG PO CP24: ORAL_CAPSULE | ORAL | 0 refills | Status: DC

## 2017-12-01 NOTE — Telephone Encounter (Signed)
Copied from [place] [id number]. Topic: Quick Communication - Rx Refill/Question >> Dec 01, 2017 12:21 PM Selinda Flavin B, [place] wrote: **Patient took her last dose today. Sent refill request through mychart on 11/25/17**  Medication: amphetamine-dextroamphetamine (ADDERALL XR) 20 MG 24 hr capsule  Has the patient contacted their pharmacy? Yes.   (Agent: If no, request that the patient contact the pharmacy for the refill.) (Agent: If yes, when and what did the pharmacy advise?)  Preferred Pharmacy (with phone number or street name): [id number] - [place], [address]  Agent: Please be advised that RX refills may take up to 3 business days. We ask that you follow-up with your pharmacy.

## 2018-01-06 ENCOUNTER — Ambulatory Visit (INDEPENDENT_AMBULATORY_CARE_PROVIDER_SITE_OTHER): Payer: Managed Care, Other (non HMO) | Admitting: Family Medicine

## 2018-01-06 ENCOUNTER — Encounter: Payer: Self-pay | Admitting: Family Medicine

## 2018-01-06 VITALS — BP 110/90 | HR 110 | Temp 98.5°F | Ht 62.0 in | Wt 203.9 lb

## 2018-01-06 DIAGNOSIS — K219 Gastro-esophageal reflux disease without esophagitis: Secondary | ICD-10-CM

## 2018-01-06 DIAGNOSIS — F988 Other specified behavioral and emotional disorders with onset usually occurring in childhood and adolescence: Secondary | ICD-10-CM

## 2018-01-06 DIAGNOSIS — Z0000 Encounter for general adult medical examination without abnormal findings: Secondary | ICD-10-CM | POA: Diagnosis not present

## 2018-01-06 DIAGNOSIS — N3941 Urge incontinence: Secondary | ICD-10-CM

## 2018-01-06 DIAGNOSIS — Z719 Counseling, unspecified: Secondary | ICD-10-CM | POA: Insufficient documentation

## 2018-01-06 DIAGNOSIS — J4530 Mild persistent asthma, uncomplicated: Secondary | ICD-10-CM | POA: Diagnosis not present

## 2018-01-06 DIAGNOSIS — R739 Hyperglycemia, unspecified: Secondary | ICD-10-CM

## 2018-01-06 DIAGNOSIS — K449 Diaphragmatic hernia without obstruction or gangrene: Secondary | ICD-10-CM

## 2018-01-06 MED ORDER — TOLTERODINE TARTRATE ER 4 MG PO CP24: ORAL_CAPSULE | ORAL | 4 refills | Status: DC

## 2018-01-06 MED ORDER — MONTELUKAST SODIUM 10 MG PO TABS: ORAL_TABLET | ORAL | 4 refills | Status: DC

## 2018-01-06 MED ORDER — METFORMIN HCL ER 500 MG PO TB24: 500.0000 mg | ORAL_TABLET | Freq: Every day | ORAL | 4 refills | Status: DC

## 2018-01-06 MED ORDER — LEVOTHYROXINE SODIUM 50 MCG PO TABS: 50.0000 ug | ORAL_TABLET | Freq: Every day | ORAL | 4 refills | Status: DC

## 2018-01-06 MED ORDER — HYDROCHLOROTHIAZIDE 25 MG PO TABS: 25.0000 mg | ORAL_TABLET | Freq: Every morning | ORAL | 4 refills | Status: DC

## 2018-01-06 NOTE — Patient Instructions (Signed)
Labs today............. I will call if there is anything abnormal  Continue current meds  Send me a message on my chart the third week in November so I can refill your Adderall for 3 months  It has been my honor to take care of you and your family  all these years.  Thanks for coming to see me.  My love to you and your family!!!!!!!!!!!!!!!!!!!!!!!!!   Remember to get your mammogram  Call your insurance company and find out where you can get the new shingles vaccine although you might not be able to get it since you are on prednisone

## 2018-01-06 NOTE — Progress Notes (Signed)
Carrie [name] is a delightful 57 year old married female non-smoker who comes in today for annual physical examination.  I first saw her 43 years ago when she was 12.  Patient has a long-standing history of allergic rhinitis and severe asthma.  Her wedding night we admitted to the hospital and put on a ventilator for respiratory failure.  She had a number of admissions for respiratory failure as a young person.  As an adult she still has asthma and is followed by me and pulmonary.  She is currently on Qvar 1 puff twice daily, Singulair 10 mg daily, Proventil nebulizer as needed, Proventil HFA 2 puffs every 6 hours as needed.  She states she had to use her [facility] Pro Air today because of a asthma attack this morning.  She said coming in here triggered her attack.  She also takes Protonix because of reflux esophagitis.  She had endoscopy in the last couple years which showed a hiatal hernia.  She also has diabetes for which she takes metformin 500 mg daily.  She has a history of adult ADD for which she takes Adderall long-acting 20 mg daily.  She is having side effects of the prednisone.  When they try to taper prednisone off it triggers her asthma.  She will get down to 2 mg a day and then have a flare.  She is currently on 5 mg daily.  Because of this she is developed a neuropathy and is on Cymbalta 60 mg daily but it does not seem to work as well as it did initially.  She also has a history of hypothyroidism on Synthroid 50 mcg daily.  She also has history of urinary incontinence.  She is on Detrol 4 mg daily.  Vaccinations up-to-date she cannot tolerate a flu shot.  She had one couple years ago and had a bad reaction.  She developed severe myopathy.  She tells me she went to a facility on 68 because of abdominal pain she had a CT scan and there is a question about some cysts in her liver.  Family history unchanged.  Her mother or father had colon cancer.  She gets routine colonoscopies and GI.  14 point  review of system reviewed otherwise negative  BP 110/90 (BP Location: Right Arm, Patient Position: Sitting, Cuff Size: Large)   Pulse (!) 110   Temp 98.5 F (36.9 C) (Oral)   Ht 5\' 2"  (1.575 m)   Wt 203 lb 14.4 oz (92.5 kg)   SpO2 96%   BMI 37.29 kg/m  Well-developed well-nourished female no acute distress vital signs stable she is afebrile examination of the head eyes ears nose and throat were negative neck was supple thyroid is not enlarged no carotid bruits.  Cardiopulmonary exam normal except for some slight symmetrical wheezing on forced expiration.  Abdominal exam was negative pelvic not indicated rectal not indicated extremities normal skin normal peripheral pulses normal  1.  Healthy female  2.  Long-standing history of asthma........ continue follow-up in pulmonary and current medications  3.  Hypothyroidism....... continue Synthroid check labs  4.  Diabetes........ continue metformin check labs  5.  Adult ADD........... continue Adderall  6.  History of hiatal hernia with reflux esophagitis continue Protonix in the morning and Pepcid nightly  7.  Urinary incontinence.......... continue the Detrol

## 2018-01-07 LAB — CBC WITH DIFFERENTIAL/PLATELET
Basophils Absolute: 0.1 10*3/uL (ref 0.0–0.1)
Basophils Relative: 0.8 % (ref 0.0–3.0)
Eosinophils Absolute: 0 10*3/uL (ref 0.0–0.7)
Eosinophils Relative: 0.5 % (ref 0.0–5.0)
HCT: 41.6 % (ref 36.0–46.0)
Hemoglobin: 13.9 g/dL (ref 12.0–15.0)
Lymphocytes Relative: 12.8 % (ref 12.0–46.0)
Lymphs Abs: 0.8 10*3/uL (ref 0.7–4.0)
MCHC: 33.3 g/dL (ref 30.0–36.0)
MCV: 89.5 fl (ref 78.0–100.0)
Monocytes Absolute: 0.2 10*3/uL (ref 0.1–1.0)
Monocytes Relative: 2.6 % — ABNORMAL LOW (ref 3.0–12.0)
Neutro Abs: 5.3 10*3/uL (ref 1.4–7.7)
Neutrophils Relative %: 83.3 % — ABNORMAL HIGH (ref 43.0–77.0)
Platelets: 329 10*3/uL (ref 150.0–400.0)
RBC: 4.65 Mil/uL (ref 3.87–5.11)
RDW: 14.5 % (ref 11.5–15.5)
WBC: 6.4 10*3/uL (ref 4.0–10.5)

## 2018-01-07 LAB — BASIC METABOLIC PANEL
BUN: 8 mg/dL (ref 6–23)
CO2: 28 mEq/L (ref 19–32)
Calcium: 9.9 mg/dL (ref 8.4–10.5)
Chloride: 102 mEq/L (ref 96–112)
Creatinine, Ser: 0.63 mg/dL (ref 0.40–1.20)
GFR: 124.91 mL/min (ref >60.00)
Glucose, Bld: 129 mg/dL — ABNORMAL HIGH (ref 70–99)
Potassium: 4.2 mEq/L (ref 3.5–5.1)
Sodium: 140 mEq/L (ref 135–145)

## 2018-01-07 LAB — LIPID PANEL
Cholesterol: 230 mg/dL — ABNORMAL HIGH (ref 0–200)
HDL: 56.6 mg/dL (ref >39.00)
LDL Cholesterol: 156 mg/dL — ABNORMAL HIGH (ref 0–99)
NonHDL: 173.39
Total CHOL/HDL Ratio: 4
Triglycerides: 85 mg/dL (ref 0.0–149.0)
VLDL: 17 mg/dL (ref 0.0–40.0)

## 2018-01-07 LAB — HEPATIC FUNCTION PANEL
ALT: 18 U/L (ref 0–35)
AST: 14 U/L (ref 0–37)
Albumin: 4.4 g/dL (ref 3.5–5.2)
Alkaline Phosphatase: 87 U/L (ref 39–117)
Bilirubin, Direct: 0.1 mg/dL (ref 0.0–0.3)
Total Bilirubin: 0.5 mg/dL (ref 0.2–1.2)
Total Protein: 7.1 g/dL (ref 6.0–8.3)

## 2018-01-07 LAB — HEMOGLOBIN A1C: Hgb A1c MFr Bld: 5.9 % (ref 4.6–6.5)

## 2018-01-07 LAB — TSH: TSH: 0.82 u[IU]/mL (ref 0.35–4.50)

## 2018-01-07 LAB — MICROALBUMIN / CREATININE URINE RATIO
Creatinine,U: 34.9 mg/dL
Microalb Creat Ratio: 2 mg/g (ref 0.0–30.0)
Microalb, Ur: <0.7 mg/dL (ref 0.0–1.9)

## 2018-01-09 ENCOUNTER — Other Ambulatory Visit: Payer: Self-pay | Admitting: Internal Medicine

## 2018-01-19 ENCOUNTER — Other Ambulatory Visit: Payer: Self-pay | Admitting: Internal Medicine

## 2018-01-22 ENCOUNTER — Other Ambulatory Visit: Payer: Self-pay | Admitting: Internal Medicine

## 2018-01-23 ENCOUNTER — Encounter: Payer: Self-pay | Admitting: Family Medicine

## 2018-01-23 DIAGNOSIS — F988 Other specified behavioral and emotional disorders with onset usually occurring in childhood and adolescence: Secondary | ICD-10-CM

## 2018-02-06 ENCOUNTER — Other Ambulatory Visit: Payer: Self-pay | Admitting: Neurology

## 2018-02-06 ENCOUNTER — Other Ambulatory Visit: Payer: Self-pay | Admitting: Internal Medicine

## 2018-02-10 ENCOUNTER — Other Ambulatory Visit: Payer: Self-pay | Admitting: Internal Medicine

## 2018-02-16 MED ORDER — AMPHETAMINE-DEXTROAMPHET ER 20 MG PO CP24: 40.0000 mg | ORAL_CAPSULE | Freq: Every day | ORAL | 0 refills | Status: DC

## 2018-02-16 NOTE — Telephone Encounter (Signed)
Patient has an appointment 02/26/18.  Okay to refill until then?

## 2018-02-16 NOTE — Telephone Encounter (Signed)
Will refill once. I do not feel comfortable treating ADHD without psychiatry input. She needs appointment with psychiatry if she would like me to continue to refill this medication.

## 2018-02-20 ENCOUNTER — Ambulatory Visit (INDEPENDENT_AMBULATORY_CARE_PROVIDER_SITE_OTHER): Payer: Managed Care, Other (non HMO) | Admitting: Internal Medicine

## 2018-02-20 ENCOUNTER — Encounter: Payer: Self-pay | Admitting: Internal Medicine

## 2018-02-20 DIAGNOSIS — J4530 Mild persistent asthma, uncomplicated: Secondary | ICD-10-CM

## 2018-02-20 LAB — NITRIC OXIDE: Nitric Oxide: 147

## 2018-02-20 MED ORDER — BUDESONIDE-FORMOTEROL FUMARATE 160-4.5 MCG/ACT IN AERO: 2.0000 | INHALATION_SPRAY | Freq: Two times a day (BID) | RESPIRATORY_TRACT | 0 refills | Status: DC

## 2018-02-20 MED ORDER — FAMOTIDINE 20 MG PO TABS: ORAL_TABLET | ORAL | 3 refills | Status: DC

## 2018-02-20 MED ORDER — PANTOPRAZOLE SODIUM 40 MG PO TBEC: DELAYED_RELEASE_TABLET | ORAL | 11 refills | Status: DC

## 2018-02-20 MED ORDER — MONTELUKAST SODIUM 10 MG PO TABS: ORAL_TABLET | ORAL | 4 refills | Status: DC

## 2018-02-20 MED ORDER — BUDESONIDE-FORMOTEROL FUMARATE 160-4.5 MCG/ACT IN AERO: 2.0000 | INHALATION_SPRAY | Freq: Two times a day (BID) | RESPIRATORY_TRACT | 11 refills | Status: DC

## 2018-02-20 NOTE — Patient Instructions (Addendum)
Stop qvar   Start symbicort 160 ake 2 puffs first thing in am and then another 2 puffs about 12 hours later.    Only use your albuterol as a rescue medication to be used if you can't catch your breath by resting or doing a relaxed purse lip breathing pattern.  - The less you use it, the better it will work when you need it. - Ok to use up to 2 puffs  every 4 hours if you must but call for immediate appointment if use goes up over your usual need - Don't leave home without it !!  (think of it like the spare tire for your car)   Work on inhaler technique:  relax and gently blow all the way out then take a nice smooth deep breath back in, triggering the inhaler at same time you start breathing in.  Hold for up to 5 seconds if you can. Blow out thru nose. Rinse and gargle with water when done      Please schedule a follow up office visit in 4 weeks, sooner if needed

## 2018-02-20 NOTE — Progress Notes (Signed)
Subjective:     Patient ID: Carrie [name], female   DOB: 30-Sep-1960,    MRN: [id number]     Brief patient profile:  57 yobf never smoker onset asthma age 57 and placed on shots for "everything that grows" needed to stay on inhalers / complicated by 3 admits requiring ventilator last time around age 35 and overall much better since then but intermittently on prednisone and as of 2011 required daily prednisone so referred to pulmonary clinic 02/13/2016 by Dr   Julianne Rice allergy shots but off  Around 2000    History of Present Illness  02/13/2016 1st [facility] office visit/ Carrie [name]  maint rx qvar 80 2bid/ singiulair/ pred 10  Chief Complaint  Patient presents with  . Pulmonary Consult    Referred by Dr. Sherren Mocha for eval of Asthma. Pt states that she has been on prednisone for years for her asthma. She is currently not having any symptoms.   for the last six years only comes off prednisone for a few days then back on it due to wheeze/ sob s much cough and presently on 10 mg daily and qvar 80 2 every 12 hours and no saba x sev weeks   Apparently doesn't taper below 10 in any increment Has intermittent hb on prn ppi  rec Pantoprazole (protonix) 40 mg   Take  30-60 min before first meal of the day and Pepcid (famotidine)  20 mg one @  bedtime until return to office    Qvar 80 Take 2 puffs first thing in am and then another 2 puffs about 12 hours later.  Work on inhaler technique:     Only use your albuterol as a rescue medication  Try Prednisone 5 mg daily x 2 weeks then  5 alternating with 2.5 mg (one-half pill) - if worse resume previous dose     04/16/2016  f/u ov/Carrie [name] re: steroid dep asthma with pred down to 5 mg daily / qvar 80 2bid / singulair /gerd rx  Chief Complaint  Patient presents with  . Follow-up    Breathing is overall doing well. She c/o minimal congestion "behind my nose"- just started today. She is on pred 5 mg daily and has not went any lower than that. She has  not had to use her proair.   Not limited by breathing from desired activities  / no saba use at all  Some nausea at the lower doses of pred but very happy with wt loss on the lower doses rec Take 5 mg one half daily x 2 weeks then one half even days x 2 weeks then one half every 3 days x 2 weeks and stop. If get worse nausea/ loss of appetite at any point go back up to higher dose  and alternate doses even/odd days    06/26/2016  f/u ov/Carrie [name] re:  Chronic  Asthma / on qvar 80 2bid / singulair/ gerd rx  and cortef per Suzette Battiest for adrenal insuff Chief Complaint  Patient presents with  . Follow-up    Pt c/o hoarseness from the pollen. Pt denies cough, wheezing, and SOB, CP/tightness.    saba once a week at most / some daytime only  throat clearing but o/w doing fine rec GERD diet  Plan A = Automatic = Qvar 80 Take 2 puffs first thing in am and then another 2 puffs about 12 hours later.  Plan B = Backup Only use your albuterol as a rescue  Plan C = Crisis - only use your albuterol nebulizer if you first try Plan B and it fails to help > ok to use the nebulizer up to every 4 hours but if start needing it regularly call for immediate appointment    12/27/2016  f/u ov/Carrie [name] re: chronic asthma / on qvar 80 2bid / singulair  Chief Complaint  Patient presents with  . Follow-up    Breathing is doing well and no new co's today. She states she has not had to use her rescue inhaler or neb.   main issue is muscle aches when tapers off prednisone, not sob or cough  Not limited by breathing from desired activities   rec No change rx Return in 6 months      02/20/2018  f/u ov/Carrie [name] re: mild asthma reduced  pred down to pred 2 mg bid then asthma flared whereas  on 5mg  bid no breakthru Chief Complaint  Patient presents with  . Follow-up    Breathing is doing well today. She is using her albuterol inhaler several times per day. She rarely uses neb.  Dyspnea:  Not limited by breathing from desired  activities  - needing saba sev times a week  Cough: no cough  Sleeping: wakes up 3-4 x week feeling wheezing > takes  albuterol and back to bed / last 12 hours prio  SABA use: sev times a day s pattern indoors vs outdoors, time of day or temp  02: none  Noct symptoms  worse off pepcid with overt noct hb    No obvious patterns in  day to day or daytime variability or assoc excess/ purulent sputum or mucus plugs or hemoptysis or cp or chest tightness, subjective wheeze or overt sinus  symptoms.     Also denies any obvious fluctuation of symptoms with weather or environmental changes or other aggravating or alleviating factors except as outlined above   No unusual exposure hx or h/o childhood pna   or knowledge of premature birth.  Current Allergies, Complete Past Medical History, Past Surgical History, Family History, and Social History were reviewed in [employer] record.  ROS  The following are not active complaints unless bolded Hoarseness, sore throat, dysphagia, dental problems, itching, sneezing,  nasal congestion or discharge of excess mucus or purulent secretions, ear ache,   fever, chills, sweats, unintended wt loss or wt gain, classically pleuritic or exertional cp,  orthopnea pnd or arm/hand swelling  or leg swelling, presyncope, palpitations, abdominal pain, anorexia, nausea, vomiting, diarrhea  or change in bowel habits or change in bladder habits, change in stools or change in urine, dysuria, hematuria,  rash, arthralgias, visual complaints, headache, numbness, weakness or ataxia or problems with walking or coordination,  change in mood or  memory.        Current Meds  Medication Sig  . albuterol (PROVENTIL) (2.5 MG/3ML) 0.083% nebulizer solution USE 1 VIAL VIA NEBULIZER EVERY 4 HOURS AS NEEDED FOR WHEEZE/SHORTNESS OF BREATH  . amphetamine-dextroamphetamine (ADDERALL XR) 20 MG 24 hr capsule Take 2 capsules (40 mg total) by mouth daily.  . cyclobenzaprine  (FLEXERIL) 10 MG tablet TAKE 1 TABLET BY MOUTH EVERYDAY AT BEDTIME  . DULoxetine (CYMBALTA) 60 MG capsule Take 1 capsule (60 mg total) by mouth daily.  . famotidine (PEPCID) 20 MG tablet One at bedtime  . hydrochlorothiazide (HYDRODIURIL) 25 MG tablet Take 1 tablet (25 mg total) by mouth every morning.  Marland Kitchen levothyroxine (SYNTHROID, LEVOTHROID) 50 MCG tablet Take 1 tablet (  50 mcg total) by mouth daily before breakfast.  . metFORMIN (GLUCOPHAGE-XR) 500 MG 24 hr tablet Take 1 tablet (500 mg total) by mouth daily with breakfast.  . montelukast (SINGULAIR) 10 MG tablet 1 p.o. nightly  . pantoprazole (PROTONIX) 40 MG tablet Take 30-60 min before first meal of the day  . PROAIR HFA 108 (90 Base) MCG/ACT inhaler USE 2 INHALATIONS EVERY 6 HOURS AS NEEDED  . tolterodine (DETROL LA) 4 MG 24 hr capsule 2 capsules daily  . [  beclomethasone (QVAR) 80 MCG/ACT inhaler Inhale 2 puffs into the lungs 2 (two) times daily.  .     .    .    .                      Objective:   Physical Exam    amb obese bf nad    02/20/2018       203  12/27/2016     173  06/26/2016        188  04/16/2016       191   02/13/16 203 lb 3.2 oz (92.2 kg)  02/05/16 200 lb (90.7 kg)  01/24/16 200 lb (90.7 kg)     Vital signs reviewed - Note on arrival 02 sats  95% on RA    HEENT: nl dentition,  and oropharynx. Nl external ear canals without cough reflex - mild/mod  bilateral non-specific turbinate edema     NECK :  without JVD/Nodes/TM/ nl carotid upstrokes bilaterally   LUNGS: no acc muscle use,  Nl contour chest which is clear to A and P bilaterally without cough on insp or exp maneuvers   CV:  RRR  no s3 or murmur or increase in P2, and no edema   ABD:  soft and nontender with nl inspiratory excursion in the supine position. No bruits or organomegaly appreciated, bowel sounds nl  MS:  Nl gait/ ext warm without deformities, calf tenderness, cyanosis or clubbing No obvious joint restrictions   SKIN: warm  and dry without lesions    NEURO:  alert, approp, nl sensorium with  no motor or cerebellar deficits apparent.                Assessment:

## 2018-02-21 ENCOUNTER — Encounter: Payer: Self-pay | Admitting: Internal Medicine

## 2018-02-21 NOTE — Assessment & Plan Note (Signed)
Spirometry 02/13/2016  Nl x mid flows reduced to 56% and min curvature  pre - saba on qvar 80 2bid and pred 10 mg daily > tapered off as of 06/26/2016 only on physiologic steroids per dr Christeen Douglas guidance  - 12/27/2016     continue qvar 80 2bid  - Spirometry 02/20/2018  FEV1 1.6 (78%)  Ratio 68 with mild curvature > 6 h since last saba   - FENO 02/20/2018  =   147 on qvar 80 2bid  - 02/20/2018  After extensive coaching inhaler device,  effectiveness =    90% from baseline 75% > try step up to symb 160 2bid    Previously pred was being used to prevent adrenal insuff issues but now when tapers to < 5 mg bid she starts needing more saba to control wheezing with evidence of active eos inflammation (high feno) in airways and moderate asthma by spirometry.   Therefore rec  - step up to symb 160 2bid and work on maintaining perfect hfa  - continue singulair  - add hs pepcid back to her regimen - return in 4 weeks to regroup ? Will need biologics depending on whether makes a major clinical difference in her chronic systemic steroid dependency   Advised: formulary restrictions will be an ongoing challenge for the forseable future and I would be happy to pick an alternative if the pt will first  provide me a list of them but pt  will need to return here for training for any new device that is required eg dpi vs hfa vs respimat.    In meantime we can always provide samples so the patient never runs out of any needed respiratory medications.    I had an extended discussion with the patient reviewing all relevant studies completed to date and  lasting 15 to 20 minutes of a 25 minute visit    See device teaching which extended face to face time for this visit.  Each maintenance medication was reviewed in detail including emphasizing most importantly the difference between maintenance and prns and under what circumstances the prns are to be triggered using an action plan format that is not reflected in the  computer generated alphabetically organized AVS which I have not found useful in most complex patients, especially with respiratory illnesses  Please see AVS for specific instructions unique to this visit that I personally wrote and verbalized to the the pt in detail and then reviewed with pt  by my nurse highlighting any  changes in therapy recommended at today's visit to their plan of care.

## 2018-02-23 ENCOUNTER — Other Ambulatory Visit: Payer: Self-pay | Admitting: Internal Medicine

## 2018-02-23 MED ORDER — FAMOTIDINE 20 MG PO TABS: ORAL_TABLET | ORAL | 3 refills | Status: DC

## 2018-02-26 ENCOUNTER — Ambulatory Visit (INDEPENDENT_AMBULATORY_CARE_PROVIDER_SITE_OTHER): Payer: Managed Care, Other (non HMO) | Admitting: Internal Medicine

## 2018-02-26 ENCOUNTER — Encounter: Payer: Self-pay | Admitting: Internal Medicine

## 2018-02-26 VITALS — BP 120/80 | HR 88 | Temp 98.6°F | Wt 206.0 lb

## 2018-02-26 DIAGNOSIS — F988 Other specified behavioral and emotional disorders with onset usually occurring in childhood and adolescence: Secondary | ICD-10-CM | POA: Diagnosis not present

## 2018-02-26 DIAGNOSIS — K219 Gastro-esophageal reflux disease without esophagitis: Secondary | ICD-10-CM | POA: Diagnosis not present

## 2018-02-26 DIAGNOSIS — E1142 Type 2 diabetes mellitus with diabetic polyneuropathy: Secondary | ICD-10-CM

## 2018-02-26 DIAGNOSIS — J4530 Mild persistent asthma, uncomplicated: Secondary | ICD-10-CM

## 2018-02-26 DIAGNOSIS — Z1239 Encounter for other screening for malignant neoplasm of breast: Secondary | ICD-10-CM

## 2018-02-26 DIAGNOSIS — K449 Diaphragmatic hernia without obstruction or gangrene: Secondary | ICD-10-CM

## 2018-02-26 MED ORDER — AMPHETAMINE-DEXTROAMPHET ER 20 MG PO CP24: 40.0000 mg | ORAL_CAPSULE | Freq: Every day | ORAL | 0 refills | Status: DC

## 2018-02-26 MED ORDER — AMPHETAMINE-DEXTROAMPHET ER 20 MG PO CP24: ORAL_CAPSULE | ORAL | 0 refills | Status: DC

## 2018-02-26 NOTE — Progress Notes (Signed)
Established Patient Office Visit     CC/Reason for Visit: Establish care, medication refills, follow-up on chronic medical conditions  HPI: Carrie [name] is a 57 y.o. female who is coming in today for the above mentioned reasons.  Due for annual physical exam in October 2020.  Past Medical History is significant for: ADHD on Adderall, hypothyroidism, type 2 diabetes, neuropathy related to prednisone use, GERD and mostly significant for asthma that has been difficult to control.  She has just been weaned completely off prednisone over the past 2 weeks.  She was started on Symbicort 2 days ago and has not felt the need to use albuterol since starting it.  Follows with pulmonary, Dr. Melvyn Novas, routinely.  Her diabetes has been well controlled.  Her CBGs ranged between 70 and 110.  She follows with Dr. Suzette Battiest with endocrinology.  There have been talks about discontinuing metformin but patient has been hesitant to do so.  She also has hypothyroidism that has been on stable dose of Synthroid.  She has no acute complaints at today's visit other than needing refills of her medications.  Past Medical/Surgical History: Past Medical History:  Diagnosis Date  . A-fib ([place])   . ADD (attention deficit disorder)   . Adrenal abnormality ([place])   . Anemia, iron deficiency   . Asthma   . Diabetes mellitus without complication ([place])   . Fibromyalgia   . Hiatal hernia   . Numbness and tingling   . Obese   . Thyroid disease     Past Surgical History:  Procedure Laterality Date  . ABDOMINAL HYSTERECTOMY    . KNEE SURGERY    . SHOULDER SURGERY      Social History:  reports that she has never smoked. She has never used smokeless tobacco. She reports current alcohol use. She reports that she does not use drugs.  Allergies: Allergies  Allergen Reactions  . Aspirin     REACTION: asthma- on respirator for 28 days  . Codeine Phosphate     REACTION: hives  . Influenza Vaccines     Soreness,  increased asthma, side pain, trouble walking  . Penicillins     REACTION: wheezing and hives    Family History:  Family History  Problem Relation Age of Onset  . Asthma Mother        seasonal  . Colon cancer Mother   . Lung cancer Maternal Uncle   . Esophageal cancer Father        smoker, mets to lungs  . Diabetes Sister      Current Outpatient Medications:  .  albuterol (PROVENTIL) (2.5 MG/3ML) 0.083% nebulizer solution, USE 1 VIAL VIA NEBULIZER EVERY 4 HOURS AS NEEDED FOR WHEEZE/SHORTNESS OF BREATH, Disp: 75 mL, Rfl: 6 .  amphetamine-dextroamphetamine (ADDERALL XR) 20 MG 24 hr capsule, Take 2 capsules (40 mg total) by mouth daily., Disp: 60 capsule, Rfl: 0 .  budesonide-formoterol (SYMBICORT) 160-4.5 MCG/ACT inhaler, Inhale 2 puffs into the lungs 2 (two) times daily., Disp: 1 Inhaler, Rfl: 11 .  cyclobenzaprine (FLEXERIL) 10 MG tablet, TAKE 1 TABLET BY MOUTH EVERYDAY AT BEDTIME, Disp: 60 tablet, Rfl: 5 .  DULoxetine (CYMBALTA) 60 MG capsule, Take 1 capsule (60 mg total) by mouth daily., Disp: 90 capsule, Rfl: 4 .  famotidine (PEPCID) 20 MG tablet, One at bedtime, Disp: 90 tablet, Rfl: 3 .  hydrochlorothiazide (HYDRODIURIL) 25 MG tablet, Take 1 tablet (25 mg total) by mouth every morning., Disp: 100 tablet, Rfl: 4 .  levothyroxine (SYNTHROID, LEVOTHROID) 50 MCG tablet, Take 1 tablet (50 mcg total) by mouth daily before breakfast., Disp: 100 tablet, Rfl: 4 .  metFORMIN (GLUCOPHAGE-XR) 500 MG 24 hr tablet, Take 1 tablet (500 mg total) by mouth daily with breakfast., Disp: 100 tablet, Rfl: 4 .  montelukast (SINGULAIR) 10 MG tablet, 1 p.o. nightly, Disp: 100 tablet, Rfl: 4 .  pantoprazole (PROTONIX) 40 MG tablet, Take 30-60 min before first meal of the day, Disp: 90 tablet, Rfl: 11 .  PROAIR HFA 108 (90 Base) MCG/ACT inhaler, USE 2 INHALATIONS EVERY 6 HOURS AS NEEDED, Disp: 25.5 g, Rfl: 3 .  tolterodine (DETROL LA) 4 MG 24 hr capsule, 2 capsules daily, Disp: 180 capsule, Rfl: 4  Review  of Systems:  Constitutional: Denies fever, chills, diaphoresis, appetite change and fatigue.  HEENT: Denies photophobia, eye pain, redness, hearing loss, ear pain, congestion, sore throat, rhinorrhea, sneezing, mouth sores, trouble swallowing, neck pain, neck stiffness and tinnitus.   Respiratory: Denies SOB, DOE, cough, chest tightness,  and wheezing.   Cardiovascular: Denies chest pain, palpitations and leg swelling.  Gastrointestinal: Denies nausea, vomiting, abdominal pain, diarrhea, constipation, blood in stool and abdominal distention.  Genitourinary: Denies dysuria, urgency, frequency, hematuria, flank pain and difficulty urinating.  Endocrine: Denies: hot or cold intolerance, sweats, changes in hair or nails, polyuria, polydipsia. Musculoskeletal: Denies myalgias, back pain, joint swelling, arthralgias and gait problem.  Skin: Denies pallor, rash and wound.  Neurological: Denies dizziness, seizures, syncope, weakness, light-headedness, numbness and headaches.  Hematological: Denies adenopathy. Easy bruising, personal or family bleeding history  Psychiatric/Behavioral: Denies suicidal ideation, mood changes, confusion, nervousness, sleep disturbance and agitation    Physical Exam: Vitals:   02/26/18 0710  BP: 120/80  Pulse: 88  Temp: 98.6 F (37 C)  TempSrc: Oral  SpO2: 98%  Weight: 206 lb (93.4 kg)    Body mass index is 37.68 kg/m.   Constitutional: NAD, calm, comfortable Eyes: PERRL, lids and conjunctivae normal ENMT: Mucous membranes are moist.  Neck: normal, supple, no masses, no thyromegaly Respiratory: clear to auscultation bilaterally, no wheezing, no crackles. Normal respiratory effort. No accessory muscle use.  Cardiovascular: Regular rate and rhythm, no murmurs / rubs / gallops. No extremity edema. 2+ pedal pulses. No carotid bruits.  Abdomen: no tenderness, no masses palpated. No hepatosplenomegaly. Bowel sounds positive.  Musculoskeletal: no clubbing /  cyanosis. No joint deformity upper and lower extremities. Good ROM, no contractures. Normal muscle tone.  Skin: no rashes, lesions, ulcers. No induration Neurologic: Grossly intact and nonfocal Psychiatric: Normal judgment and insight. Alert and oriented x 3. Normal mood.    Impression and Plan:  Screening for breast cancer  -She is past due for mammogram, will order today.  Attention deficit disorder, unspecified hyperactivity presence -Has been on Adderall 40 mg extended release daily for many years. -We will refill today. -She will secure follow-up with the [place] [facility] per my request.  Mild persistent asthma, uncomplicated -Follows with pulmonary routinely. -She has just been started on Symbicort and has already noticed significant improvement with decreased albuterol inhaler use.  She has also been off prednisone, she had been dependent on prednisone for many years.  Gastroesophageal reflux disease with hiatal hernia -She uses Pepcid at bedtime and Protonix in the morning.  When she has tried to wean herself off these medications has significant symptoms which mainly manifest as asthma.  Type 2 diabetes mellitus with diabetic polyneuropathy, without long-term current use of insulin ([place]) -Extremely well controlled with most  recent A1c of 5.9 in October 2019. -Follows routinely with endocrinology, they are talking about taking her off metformin.     Patient Instructions  -It was nice meeting you today!  -I will see you back in 6 months for routine follow-up.  Please contact us sooner if you have any issues.     Lelon Frohlich, MD [facility]

## 2018-02-26 NOTE — Patient Instructions (Signed)
-  It was nice meeting you today!  -I will see you back in 6 months for routine follow-up.  Please contact us sooner if you have any issues.

## 2018-03-19 ENCOUNTER — Encounter: Payer: Self-pay | Admitting: Internal Medicine

## 2018-03-19 ENCOUNTER — Ambulatory Visit (INDEPENDENT_AMBULATORY_CARE_PROVIDER_SITE_OTHER): Payer: Self-pay | Admitting: Internal Medicine

## 2018-03-19 VITALS — BP 144/90 | HR 88 | Ht 62.0 in | Wt 206.8 lb

## 2018-03-19 DIAGNOSIS — J4530 Mild persistent asthma, uncomplicated: Secondary | ICD-10-CM

## 2018-03-19 MED ORDER — BUDESONIDE-FORMOTEROL FUMARATE 160-4.5 MCG/ACT IN AERO: 2.0000 | INHALATION_SPRAY | Freq: Two times a day (BID) | RESPIRATORY_TRACT | 0 refills | Status: DC

## 2018-03-19 NOTE — Progress Notes (Signed)
Subjective:    Patient ID: Carrie [name], female   DOB: 1960/03/23    MRN: [id number]     Brief patient profile:  78 yobf never smoker onset asthma age 58 and placed on shots for "everything that grows" needed to stay on inhalers / complicated by 3 admits requiring ventilator last time around age 16 and overall much better since then but intermittently on prednisone and as of 2011 required daily prednisone so referred to pulmonary clinic 02/13/2016 by Dr   Julianne Rice allergy shots but off  Around 2000    History of Present Illness  02/13/2016 1st [facility] office visit/ Carrie [name]  maint rx qvar 80 2bid/ singiulair/ pred 10  Chief Complaint  Patient presents with  . Pulmonary Consult    Referred by Dr. Sherren Mocha for eval of Asthma. Pt states that she has been on prednisone for years for her asthma. She is currently not having any symptoms.   for the last six years only comes off prednisone for a few days then back on it due to wheeze/ sob s much cough and presently on 10 mg daily and qvar 80 2 every 12 hours and no saba x sev weeks   Apparently doesn't taper below 10 in any increment Has intermittent hb on prn ppi  rec Pantoprazole (protonix) 40 mg   Take  30-60 min before first meal of the day and Pepcid (famotidine)  20 mg one @  bedtime until return to office    Qvar 80 Take 2 puffs first thing in am and then another 2 puffs about 12 hours later.  Work on inhaler technique:     Only use your albuterol as a rescue medication  Try Prednisone 5 mg daily x 2 weeks then  5 alternating with 2.5 mg (one-half pill) - if worse resume previous dose     04/16/2016  f/u ov/Carrie [name] re: steroid dep asthma with pred down to 5 mg daily / qvar 80 2bid / singulair /gerd rx  Chief Complaint  Patient presents with  . Follow-up    Breathing is overall doing well. She c/o minimal congestion "behind my nose"- just started today. She is on pred 5 mg daily and has not went any lower than that. She has  not had to use her proair.   Not limited by breathing from desired activities  / no saba use at all  Some nausea at the lower doses of pred but very happy with wt loss on the lower doses rec Take 5 mg one half daily x 2 weeks then one half even days x 2 weeks then one half every 3 days x 2 weeks and stop. If get worse nausea/ loss of appetite at any point go back up to higher dose  and alternate doses even/odd days    06/26/2016  f/u ov/Carrie [name] re:  Chronic  Asthma / on qvar 80 2bid / singulair/ gerd rx  and cortef per Suzette Battiest for adrenal insuff Chief Complaint  Patient presents with  . Follow-up    Pt c/o hoarseness from the pollen. Pt denies cough, wheezing, and SOB, CP/tightness.    saba once a week at most / some daytime only  throat clearing but o/w doing fine rec GERD diet  Plan A = Automatic = Qvar 80 Take 2 puffs first thing in am and then another 2 puffs about 12 hours later.  Plan B = Backup Only use your albuterol as a rescue   Plan  C = Crisis - only use your albuterol nebulizer if you first try Plan B and it fails to help > ok to use the nebulizer up to every 4 hours but if start needing it regularly call for immediate appointment    12/27/2016  f/u ov/Carrie [name] re: chronic asthma / on qvar 80 2bid / singulair  Chief Complaint  Patient presents with  . Follow-up    Breathing is doing well and no new co's today. She states she has not had to use her rescue inhaler or neb.   main issue is muscle aches when tapers off prednisone, not sob or cough  Not limited by breathing from desired activities   rec No change rx Return in 6 months      02/20/2018  f/u ov/Carrie [name] re: mild asthma reduced  pred down to pred 2 mg bid then asthma flared whereas  on 5mg  bid no breakthru Chief Complaint  Patient presents with  . Follow-up    Breathing is doing well today. She is using her albuterol inhaler several times per day. She rarely uses neb.  Dyspnea:  Not limited by breathing from desired  activities  - needing saba sev times a week  Cough: no cough  Sleeping: wakes up 3-4 x week feeling wheezing > takes  albuterol and back to bed / last 12 hours prio  SABA use: sev times a day s pattern indoors vs outdoors, time of day or temp  02: none  Noct symptoms  worse off pepcid with overt noct hb  rec Stop qvar  Start symbicort 160 ake 2 puffs first thing in am and then another 2 puffs about 12 hours later.  Only use your albuterol as a rescue medication  Work on inhaler technique.     03/19/2018  f/u ov/Carrie [name] re: chronic asthma / off pred mid Dec 2019 / maint symb 160 2bid and no rescue on hand  Chief Complaint  Patient presents with  . Follow-up    Breathing is doing well and she has not used her albuterol inhaler recently. No new co's.   Dyspnea:  Not limited by breathing from desired activities / but by L knee Cough: no issue  Sleeping: ok now flat/ one pillow  SABA use: no rescue  02: no   No  obvious day to day or daytime variability or assoc excess/ purulent sputum or mucus plugs or hemoptysis or cp or chest tightness, subjective wheeze or overt sinus or hb symptoms.   Sleeping  without nocturnal  or early am exacerbation  of respiratory  c/o's or need for noct saba. Also denies any obvious fluctuation of symptoms with weather or environmental changes or other aggravating or alleviating factors except as outlined above   No unusual exposure hx or h/o childhood pna/ asthma or knowledge of premature birth.  Current Allergies, Complete Past Medical History, Past Surgical History, Family History, and Social History were reviewed in [employer] record.  ROS  The following are not active complaints unless bolded Hoarseness, sore throat, dysphagia, dental problems, itching, sneezing,  nasal congestion or discharge of excess mucus or purulent secretions, ear ache,   fever, chills, sweats, unintended wt loss or wt gain, classically pleuritic or exertional  cp,  orthopnea pnd or arm/hand swelling  or leg swelling, presyncope, palpitations, abdominal pain, anorexia, nausea, vomiting, diarrhea  or change in bowel habits or change in bladder habits, change in stools or change in urine, dysuria, hematuria,  rash, arthralgias, visual complaints,  headache, numbness, weakness or ataxia or problems with walking or coordination,  change in mood or  memory.        Current Meds  Medication Sig  . albuterol (PROVENTIL) (2.5 MG/3ML) 0.083% nebulizer solution USE 1 VIAL VIA NEBULIZER EVERY 4 HOURS AS NEEDED FOR WHEEZE/SHORTNESS OF BREATH  . amphetamine-dextroamphetamine (ADDERALL XR) 20 MG 24 hr capsule Take 2 capsules by mouth daily  . budesonide-formoterol (SYMBICORT) 160-4.5 MCG/ACT inhaler Inhale 2 puffs into the lungs 2 (two) times daily.  . DULoxetine (CYMBALTA) 60 MG capsule Take 1 capsule (60 mg total) by mouth daily.  . famotidine (PEPCID) 20 MG tablet One at bedtime  . levothyroxine (SYNTHROID, LEVOTHROID) 50 MCG tablet Take 1 tablet (50 mcg total) by mouth daily before breakfast.  . metFORMIN (GLUCOPHAGE-XR) 500 MG 24 hr tablet Take 1 tablet (500 mg total) by mouth daily with breakfast.  . montelukast (SINGULAIR) 10 MG tablet 1 p.o. nightly  . pantoprazole (PROTONIX) 40 MG tablet Take 30-60 min before first meal of the day  . PROAIR HFA 108 (90 Base) MCG/ACT inhaler USE 2 INHALATIONS EVERY 6 HOURS AS NEEDED  . tolterodine (DETROL LA) 4 MG 24 hr capsule 2 capsules daily                Objective:   Physical Exam      03/19/2018         206  02/20/2018       203  12/27/2016     173  06/26/2016        188  04/16/2016       191   02/13/16 203 lb 3.2 oz (92.2 kg)  02/05/16 200 lb (90.7 kg)  01/24/16 200 lb (90.7 kg)       amb obese bf nad / all smiles    HEENT: nl dentition, turbinates bilaterally, and oropharynx. Nl external ear canals without cough reflex   NECK :  without JVD/Nodes/TM/ nl carotid upstrokes bilaterally   LUNGS: no acc  muscle use,  Nl contour chest which is clear to A and P bilaterally without cough on insp or exp maneuvers   CV:  RRR  no s3 or murmur or increase in P2, and no edema   ABD:  Obese/ soft and nontender with nl inspiratory excursion in the supine position. No bruits or organomegaly appreciated, bowel sounds nl  MS:  Nl gait/ ext warm without deformities, calf tenderness, cyanosis or clubbing No obvious joint restrictions   SKIN: warm and dry without lesions    NEURO:  alert, approp, nl sensorium with  no motor or cerebellar deficits apparent.              Assessment:

## 2018-03-19 NOTE — Assessment & Plan Note (Signed)
Spirometry 02/13/2016  Nl x mid flows reduced to 56% and min curvature  pre - saba on qvar 80 2bid and pred 10 mg daily > tapered off as of 06/26/2016 only on physiologic steroids per dr Christeen Douglas guidance  - 12/27/2016     continue qvar 80 2bid - Spirometry 02/20/2018  FEV1 1.6 (78%)  Ratio 68 with mild curvature > 6 h since last saba  - FENO 02/20/2018  =   147 on qvar 80 2bid  - 02/20/2018    try step up to symb 160 2bid from qvar and off chronic pred mid dec 2019  - 03/19/2018  After extensive coaching inhaler device,  effectiveness =    90%    Since change to symb 160/ mastered hfa/ off prednisone >>> All goals of chronic asthma control met including optimal function and elimination of symptoms with minimal need for rescue therapy.  Contingencies discussed in full including contacting this office immediately if not controlling the symptoms using the rule of two's.     I had an extended discussion with the patient reviewing all relevant studies completed to date and  lasting 15 to 20 minutes of a 25 minute visit    See device teaching which extended face to face time for this visit.  Each maintenance medication was reviewed in detail including emphasizing most importantly the difference between maintenance and prns and under what circumstances the prns are to be triggered using an action plan format that is not reflected in the computer generated alphabetically organized AVS which I have not found useful in most complex patients, especially with respiratory illnesses  Please see AVS for specific instructions unique to this visit that I personally wrote and verbalized to the the pt in detail and then reviewed with pt  by my nurse highlighting any  changes in therapy recommended at today's visit to their plan of care.

## 2018-03-19 NOTE — Patient Instructions (Signed)
Plan A = Automatic = symbicort 160 Take 2 puffs first thing in am and then another 2 puffs about 12 hours later.     Plan B = Backup Only use your albuterol inhaler as a rescue medication to be used if you can't catch your breath by resting or doing a relaxed purse lip breathing pattern.  - The less you use it, the better it will work when you need it. - Ok to use the inhaler up to 2 puffs  every 4 hours if you must but call for appointment if use goes up over your usual need - Don't leave home without it !!  (think of it like the spare tire for your car)    Plan C = Crisis - only use your albuterol nebulizer if you first try Plan B and it fails to help > ok to use the nebulizer up to every 4 hours but if start needing it regularly call for immediate appointment   Plan D = Doctor - call me if B and C not adequate  Plan E = ER - go to ER or call 911 if all else fails     Please schedule a follow up visit in 6  months but call sooner if needed

## 2018-04-28 ENCOUNTER — Ambulatory Visit
Admission: RE | Admit: 2018-04-28 | Discharge: 2018-04-28 | Disposition: A | Discharge status: Home / Self Care | Payer: BLUE CROSS/BLUE SHIELD | Source: Ambulatory Visit | Attending: Internal Medicine | Admitting: Internal Medicine

## 2018-04-28 ENCOUNTER — Encounter: Payer: Self-pay | Admitting: Internal Medicine

## 2018-04-28 DIAGNOSIS — Z1231 Encounter for screening mammogram for malignant neoplasm of breast: Secondary | ICD-10-CM | POA: Diagnosis not present

## 2018-04-28 DIAGNOSIS — Z1239 Encounter for other screening for malignant neoplasm of breast: Secondary | ICD-10-CM

## 2018-04-28 NOTE — Telephone Encounter (Signed)
Please advise Dr Jerilee Hoh, thanks. (please respond to [facility])

## 2018-04-29 ENCOUNTER — Other Ambulatory Visit: Payer: Self-pay | Admitting: Internal Medicine

## 2018-04-29 DIAGNOSIS — R928 Other abnormal and inconclusive findings on diagnostic imaging of breast: Secondary | ICD-10-CM

## 2018-05-04 ENCOUNTER — Ambulatory Visit: Payer: BLUE CROSS/BLUE SHIELD

## 2018-05-04 ENCOUNTER — Ambulatory Visit
Admission: RE | Admit: 2018-05-04 | Discharge: 2018-05-04 | Disposition: A | Discharge status: Home / Self Care | Payer: BLUE CROSS/BLUE SHIELD | Source: Ambulatory Visit | Attending: Internal Medicine | Admitting: Internal Medicine

## 2018-05-04 DIAGNOSIS — R928 Other abnormal and inconclusive findings on diagnostic imaging of breast: Secondary | ICD-10-CM

## 2018-05-06 NOTE — Progress Notes (Signed)
PATIENT: Carrie [name] DOB: Jul 21, 1960  REASON FOR VISIT: follow up HISTORY FROM: patient  HISTORY OF PRESENT ILLNESS:  HISTORY  Carrie [name]  is a 58 year old female, seen in refer by his primary care doctor  Carrie [name] for evaluation of numbness, initial evaluation was January 15 2017.  I reviewed and summarized the referring note, she has history of ADHD, atrial fibrillation, diabetes, fibromyalgia, asthma.  Because of her chronic asthma, breathing problem, she had been treated with prednisone for many years, recently tapered off prednisone 20 mg every day in July 2018, shortly after she stopped prednisone treatment, she noticed right shoulder deep achy pain, later spreading to bilateral shoulder, arm, anterior thigh, deep achy pain, numbness tingling, could not sleep, feel muscle strain, bilateral hip weakness, difficulty standing up,  She was put back on prednisone in October 2018 now taking 5 mg alternating with 10 mg, shortly afterwards, her symptoms disappeared, she is also taking Flexeril 10 mg at bedtime, which is helpful.  Laboratory evaluation March 2018, normal CMP, CBC, mild elevated LDL 146, A1c 6.1, TSH.   UPDATE Jul 31, 2017 Laboratory evaluation showed positive ANA, otherwise normal ferritin, CPK, vitamin D, B12, ESR C-reactive protein, negative auto antibodies  She was treated with Cymbalta since October 2018, responding well, rarely have any deep muscle achy pain, she was also able to successfully taper off the prednisone.  She denies significant muscle weakness  UPDATE May 07, 2018 SS She has a long standing history of asthma treated with prednisone. When tapering off prednisone, flare of asthma, she developed neuropathy. Has weaned of prednisone for 2 months now, close follow-up with pulmnology started on symbicort.  She reports her asthma is doing well. History of type 2 diabetes with diabetic polyneuropathy well-controlled with an A1c  of 5.24 December 2017.  EMG in November 2018 was mildly abnormal with evidence of median neuropathy across the wrist right worse than left.  There is no evidence of large fiber peripheral neuropathy or inflammatory myopathy.  She is taking 60 mg of Cymbalta daily.  She reports that Cymbalta has helped however she continues to have daily discomfort.  She reports that she will continue to have sharp jabs of pain to mostly her arms and occasionally her legs.  Throughout the day.  She reports that these episodes occur daily.  She denies any weakness or numbness.  She denies any change in her balance or any falls.  She denies neck pain.  She presents today for follow-up.  REVIEW OF SYSTEMS: Out of a complete 14 system review of symptoms, the patient complains only of the following symptoms, and all other reviewed systems are negative.  Fatigue, cold intolerance, flushing, aching muscles, snoring, memory loss  ALLERGIES: Allergies  Allergen Reactions  . Aspirin     REACTION: asthma- on respirator for 28 days  . Codeine Phosphate     REACTION: hives  . Influenza Vaccines     Soreness, increased asthma, side pain, trouble walking  . Penicillins     REACTION: wheezing and hives    HOME MEDICATIONS: Outpatient Medications Prior to Visit  Medication Sig Dispense Refill  . amphetamine-dextroamphetamine (ADDERALL XR) 20 MG 24 hr capsule Take 2 capsules by mouth daily 60 capsule 0  . budesonide-formoterol (SYMBICORT) 160-4.5 MCG/ACT inhaler Inhale 2 puffs into the lungs 2 (two) times daily. 1 Inhaler 0  . DULoxetine (CYMBALTA) 60 MG capsule Take 1 capsule (60 mg total) by mouth daily. 90 capsule 4  .  famotidine (PEPCID) 20 MG tablet One at bedtime 90 tablet 3  . hydrochlorothiazide (HYDRODIURIL) 25 MG tablet Take 25 mg by mouth daily.    Marland Kitchen levothyroxine (SYNTHROID, LEVOTHROID) 50 MCG tablet Take 1 tablet (50 mcg total) by mouth daily before breakfast. 100 tablet 4  . metFORMIN (GLUCOPHAGE-XR) 500 MG 24  hr tablet Take 1 tablet (500 mg total) by mouth daily with breakfast. 100 tablet 4  . montelukast (SINGULAIR) 10 MG tablet 1 p.o. nightly 100 tablet 4  . pantoprazole (PROTONIX) 40 MG tablet Take 30-60 min before first meal of the day 90 tablet 11  . PROAIR HFA 108 (90 Base) MCG/ACT inhaler USE 2 INHALATIONS EVERY 6 HOURS AS NEEDED 25.5 g 3  . tolterodine (DETROL LA) 4 MG 24 hr capsule 2 capsules daily 180 capsule 4  . albuterol (PROVENTIL) (2.5 MG/3ML) 0.083% nebulizer solution USE 1 VIAL VIA NEBULIZER EVERY 4 HOURS AS NEEDED FOR WHEEZE/SHORTNESS OF BREATH (Patient not taking: Reported on 05/07/2018) 75 mL 6   No facility-administered medications prior to visit.     PAST MEDICAL HISTORY: Past Medical History:  Diagnosis Date  . A-fib ([place])   . ADD (attention deficit disorder)   . Adrenal abnormality ([place])   . Anemia, iron deficiency   . Asthma   . Diabetes mellitus without complication ([place])   . Fibromyalgia   . Hiatal hernia   . Numbness and tingling   . Obese   . Thyroid disease     PAST SURGICAL HISTORY: Past Surgical History:  Procedure Laterality Date  . ABDOMINAL HYSTERECTOMY    . KNEE SURGERY    . SHOULDER SURGERY      FAMILY HISTORY: Family History  Problem Relation Age of Onset  . Asthma Mother        seasonal  . Colon cancer Mother   . Lung cancer Maternal Uncle   . Esophageal cancer Father        smoker, mets to lungs  . Diabetes Sister     SOCIAL HISTORY: Social History   Socioeconomic History  . Marital status: Married    Spouse name: Not on file  . Number of children: 4  . Years of education: College  . Highest education level: Not on file  Occupational History  . Occupation: Programmer, applications  Social Needs  . Financial resource strain: Not on file  . Food insecurity:    Worry: Not on file    Inability: Not on file  . Transportation needs:    Medical: Not on file    Non-medical: Not on file  Tobacco Use  . Smoking status: Never Smoker  .  Smokeless tobacco: Never Used  Substance and Sexual Activity  . Alcohol use: Yes    Comment: occ  . Drug use: No  . Sexual activity: Not on file  Lifestyle  . Physical activity:    Days per week: Not on file    Minutes per session: Not on file  . Stress: Not on file  Relationships  . Social connections:    Talks on phone: Not on file    Gets together: Not on file    Attends religious service: Not on file    Active member of club or organization: Not on file    Attends meetings of clubs or organizations: Not on file    Relationship status: Not on file  . Intimate partner violence:    Fear of current or ex partner: Not on file    Emotionally  abused: Not on file    Physically abused: Not on file    Forced sexual activity: Not on file  Other Topics Concern  . Not on file  Social History Narrative   Lives at home with husband.   Right-handed.   1 cup caffeine per day.      PHYSICAL EXAM  Vitals:   05/07/18 0854  BP: (!) 156/78  Pulse: 80  Weight: 203 lb 6.4 oz (92.3 kg)  Height: '5\' 2"'  (1.575 m)   Body mass index is 37.2 kg/m.  Generalized: Well developed, in no acute distress   Neurological examination  Mentation: Alert oriented to time, place, history taking. Follows all commands speech and language fluent Cranial nerve II-XII: Pupils were equal round reactive to light. Extraocular movements were full, visual field were full on confrontational test. Facial sensation and strength were normal. Uvula tongue midline. Head turning and shoulder shrug  were normal and symmetric. Motor: The motor testing reveals 5 over 5 strength of all 4 extremities. Good symmetric motor tone is noted throughout.  Sensory: Sensory testing is intact to soft touch on all 4 extremities. No evidence of extinction is noted.  Coordination: Cerebellar testing reveals good finger-nose-finger and heel-to-shin bilaterally.  Gait and station: Gait is normal. Tandem gait is normal. Romberg is negative.  No drift is seen.  Reflexes: Deep tendon reflexes are symmetric and normal bilaterally.   DIAGNOSTIC DATA (LABS, IMAGING, TESTING) - I reviewed patient records, labs, notes, testing and imaging myself where available.  Lab Results  Component Value Date   WBC 6.4 01/06/2018   HGB 13.9 01/06/2018   HCT 41.6 01/06/2018   MCV 89.5 01/06/2018   PLT 329.0 01/06/2018      Component Value Date/Time   NA 140 01/06/2018 1558   NA 142 04/09/2016   K 4.2 01/06/2018 1558   CL 102 01/06/2018 1558   CO2 28 01/06/2018 1558   GLUCOSE 129 (H) 01/06/2018 1558   BUN 8 01/06/2018 1558   BUN 9 04/09/2016   CREATININE 0.63 01/06/2018 1558   CALCIUM 9.9 01/06/2018 1558   PROT 7.1 01/06/2018 1558   ALBUMIN 4.4 01/06/2018 1558   AST 14 01/06/2018 1558   ALT 18 01/06/2018 1558   ALKPHOS 87 01/06/2018 1558   BILITOT 0.5 01/06/2018 1558   GFRNONAA >60 02/05/2016 1819   GFRAA >60 02/05/2016 1819   Lab Results  Component Value Date   CHOL 230 (H) 01/06/2018   HDL 56.60 01/06/2018   LDLCALC 156 (H) 01/06/2018   LDLDIRECT 143.6 04/17/2006   TRIG 85.0 01/06/2018   CHOLHDL 4 01/06/2018   Lab Results  Component Value Date   HGBA1C 5.9 01/06/2018   Lab Results  Component Value Date   VITAMINB12 602 01/15/2017   Lab Results  Component Value Date   TSH 0.82 01/06/2018      ASSESSMENT AND PLAN 58 y.o. year old female   1. Paresthesia, achy body pain   Patient reports that she had a good benefit from Cymbalta 60 mg daily.  However she reports she continues to have daily episodes of sharp pain to her arms mostly.  She is currently off prednisone.  She reports her asthma is doing well and she is using a Symbicort inhaler.  She is requesting to try something else for pain relief.  We will start gabapentin low-dose 300 mg at bedtime to start.  Today I will check a vitamin D and CK level.  I advised her that I will call  her with her results and we could discuss possibly increasing the gabapentin if  it is beneficial.  We discussed the side effects of gabapentin.  She reports already having to take Adderall in the morning to help with sleepiness at her job.  Given this information she will start taking the gabapentin at bedtime first to determine tolerance.  She will follow-up in this office in 6 months or sooner if needed.  I advised her that if her symptoms worsen or she should develop any new symptoms she should let us know.    I spent 15 minutes with the patient. 50% of this time was spent discussing her plan of care.   Butler Denmark, AGNP-C, DNP 05/07/2018, 9:32 AM [facility] [address], [place], [place] [address] [phone]

## 2018-05-07 ENCOUNTER — Encounter

## 2018-05-07 ENCOUNTER — Encounter: Payer: Self-pay | Admitting: Neurology

## 2018-05-07 ENCOUNTER — Ambulatory Visit: Payer: Managed Care, Other (non HMO) | Admitting: Neurology

## 2018-05-07 ENCOUNTER — Ambulatory Visit (INDEPENDENT_AMBULATORY_CARE_PROVIDER_SITE_OTHER): Payer: BLUE CROSS/BLUE SHIELD | Admitting: Neurology

## 2018-05-07 VITALS — BP 156/78 | HR 80 | Ht 62.0 in | Wt 203.4 lb

## 2018-05-07 DIAGNOSIS — R202 Paresthesia of skin: Secondary | ICD-10-CM

## 2018-05-07 DIAGNOSIS — M791 Myalgia, unspecified site: Secondary | ICD-10-CM

## 2018-05-07 DIAGNOSIS — R200 Anesthesia of skin: Secondary | ICD-10-CM

## 2018-05-07 MED ORDER — GABAPENTIN 300 MG PO CAPS: 300.0000 mg | ORAL_CAPSULE | Freq: Two times a day (BID) | ORAL | 3 refills | Status: DC

## 2018-05-07 NOTE — Patient Instructions (Signed)
Start taking 300 mg gabapentin at bedtime.  Gabapentin capsules or tablets What is this medicine? GABAPENTIN (GA ba pen tin) is used to control seizures in certain types of epilepsy. It is also used to treat certain types of nerve pain. This medicine may be used for other purposes; ask your health care provider or pharmacist if you have questions. COMMON BRAND NAME(S): Active-PAC with Gabapentin, Gabarone, Neurontin What should I tell my health care provider before I take this medicine? They need to know if you have any of these conditions: -kidney disease -suicidal thoughts, plans, or attempt; a previous suicide attempt by you or a family member -an unusual or allergic reaction to gabapentin, other medicines, foods, dyes, or preservatives -pregnant or trying to get pregnant -breast-feeding How should I use this medicine? Take this medicine by mouth with a glass of water. Follow the directions on the prescription label. You can take it with or without food. If it upsets your stomach, take it with food. Take your medicine at regular intervals. Do not take it more often than directed. Do not stop taking except on your doctor's advice. If you are directed to break the 600 or 800 mg tablets in half as part of your dose, the extra half tablet should be used for the next dose. If you have not used the extra half tablet within 28 days, it should be thrown away. A special MedGuide will be given to you by the pharmacist with each prescription and refill. Be sure to read this information carefully each time. Talk to your pediatrician regarding the use of this medicine in children. While this drug may be prescribed for children as young as 3 years for selected conditions, precautions do apply. Overdosage: If you think you have taken too much of this medicine contact a poison control center or emergency room at once. NOTE: This medicine is only for you. Do not share this medicine with others. What if I miss  a dose? If you miss a dose, take it as soon as you can. If it is almost time for your next dose, take only that dose. Do not take double or extra doses. What may interact with this medicine? Do not take this medicine with any of the following medications: -other gabapentin products This medicine may also interact with the following medications: -alcohol -antacids -antihistamines for allergy, cough and cold -certain medicines for anxiety or sleep -certain medicines for depression or psychotic disturbances -homatropine; hydrocodone -naproxen -narcotic medicines (opiates) for pain -phenothiazines like chlorpromazine, mesoridazine, prochlorperazine, thioridazine This list may not describe all possible interactions. Give your health care provider a list of all the medicines, herbs, non-prescription drugs, or dietary supplements you use. Also tell them if you smoke, drink alcohol, or use illegal drugs. Some items may interact with your medicine. What should I watch for while using this medicine? Visit your doctor or health care professional for regular checks on your progress. You may want to keep a record at home of how you feel your condition is responding to treatment. You may want to share this information with your doctor or health care professional at each visit. You should contact your doctor or health care professional if your seizures get worse or if you have any new types of seizures. Do not stop taking this medicine or any of your seizure medicines unless instructed by your doctor or health care professional. Stopping your medicine suddenly can increase your seizures or their severity. Wear a medical identification bracelet or  chain if you are taking this medicine for seizures, and carry a card that lists all your medications. You may get drowsy, dizzy, or have blurred vision. Do not drive, use machinery, or do anything that needs mental alertness until you know how this medicine affects you.  To reduce dizzy or fainting spells, do not sit or stand up quickly, especially if you are an older patient. Alcohol can increase drowsiness and dizziness. Avoid alcoholic drinks. Your mouth may get dry. Chewing sugarless gum or sucking hard candy, and drinking plenty of water will help. The use of this medicine may increase the chance of suicidal thoughts or actions. Pay special attention to how you are responding while on this medicine. Any worsening of mood, or thoughts of suicide or dying should be reported to your health care professional right away. Women who become pregnant while using this medicine may enroll in the [place] Pregnancy Registry by calling [phone]. This registry collects information about the safety of antiepileptic drug use during pregnancy. What side effects may I notice from receiving this medicine? Side effects that you should report to your doctor or health care professional as soon as possible: -allergic reactions like skin rash, itching or hives, swelling of the face, lips, or tongue -worsening of mood, thoughts or actions of suicide or dying Side effects that usually do not require medical attention (report to your doctor or health care professional if they continue or are bothersome): -constipation -difficulty walking or controlling muscle movements -dizziness -nausea -slurred speech -tiredness -tremors -weight gain This list may not describe all possible side effects. Call your doctor for medical advice about side effects. You may report side effects to FDA at 1-800-FDA-1088. Where should I keep my medicine? Keep out of reach of children. This medicine may cause accidental overdose and death if it taken by other adults, children, or pets. Mix any unused medicine with a substance like cat litter or coffee grounds. Then throw the medicine away in a sealed container like a sealed bag or a coffee can with a lid. Do not use the medicine  after the expiration date. Store at room temperature between 15 and 30 degrees C (59 and 86 degrees F). NOTE: This sheet is a summary. It may not cover all possible information. If you have questions about this medicine, talk to your doctor, pharmacist, or health care provider.  2019 Elsevier/Gold Standard (2017-08-07 13:21:44)

## 2018-05-07 NOTE — Progress Notes (Signed)
I have reviewed and agreed above plan. 

## 2018-05-08 LAB — CK: Total CK: 82 U/L (ref 24–173)

## 2018-05-08 LAB — VITAMIN D 25 HYDROXY (VIT D DEFICIENCY, FRACTURES): Vit D, 25-Hydroxy: 31.3 ng/mL (ref 30.0–100.0)

## 2018-05-11 ENCOUNTER — Encounter: Payer: Self-pay | Admitting: Internal Medicine

## 2018-05-11 ENCOUNTER — Telehealth: Payer: Self-pay | Admitting: Neurology

## 2018-05-11 DIAGNOSIS — N3941 Urge incontinence: Secondary | ICD-10-CM

## 2018-05-11 NOTE — Telephone Encounter (Signed)
I called the patient and let her know that her Vitamin D and CK levels were within normal limits. She is taking gabapentin 300 mg at bedtime without any improvement. She will start taking 300 mg twice a day. We discussed side effect of drowsiness with the medication. I advised her to monitor her asthma symptoms. She should be okay asthma wise, with gabapentin. She was very appreciative of the call. She did want to let me know that she has 2 bulging discs in her neck. 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 She will let me know if I can assist her with anything else.

## 2018-05-12 MED ORDER — TOLTERODINE TARTRATE ER 4 MG PO CP24: ORAL_CAPSULE | ORAL | 1 refills | Status: DC

## 2018-06-04 ENCOUNTER — Telehealth: Payer: Self-pay | Admitting: Neurology

## 2018-06-04 MED ORDER — GABAPENTIN 300 MG PO CAPS: 300.0000 mg | ORAL_CAPSULE | Freq: Four times a day (QID) | ORAL | 11 refills | Status: DC

## 2018-06-04 NOTE — Telephone Encounter (Signed)
Gabapentin dose was increased to 300mg  up to 4 times a day

## 2018-06-08 NOTE — Telephone Encounter (Signed)
Dr. Melvyn Novas, please see pt's mychart message which I have posted below and please advise on it for pt. Thanks!  From: Francoise Schaumann  Sent: 06/07/2018 12:27 PM EDT  To: Christinia Gully, MD Subject: Visit Follow-Up Question  Hello Dr. 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   My question for you is would you consider me with my current and past medical history to be someone at high risk.  If so would you be able to provide me with a letter. If not, then nothing else is needed.   Thanks in advance of either response and please take care of yourself.   Jama Flavors - [birth year]

## 2018-06-09 ENCOUNTER — Encounter: Payer: Self-pay | Admitting: *Deleted

## 2018-06-09 NOTE — Telephone Encounter (Signed)
Submitted PA gabapentin 300mg  cap #120/30 days for up to 4x/day.  [id number] - PA Case ID: [id number]. Waiting on determination.

## 2018-06-09 NOTE — Telephone Encounter (Signed)
[id number]. PA approved effective 05/10/2018-06/09/2019.

## 2018-06-09 NOTE — Telephone Encounter (Signed)
Dr Melvyn Novas, this message was received this afternoon for you.  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   I provided the email you sent to me and they felt as if you were stated that I'm at low risk of complications.  They are request something more specific around my diagnosis of being asthmatic.  Actually I'm totally terrified of catching this virus or loosing my job. I'v had pneumonia several time and instances were I'm be on a ventilator. None of which I would like to face again if I can prevent it.   Would it be possible for you to send me a letter via [facility]. Providing my asthma specifically as being a pre-existing medical condition that increases the risk of complications. I'm certain that this is the absolute truth. I would never request anything but a truthful explanation.   Carrie [name] 04/16/1960    Message routed to Dr Melvyn Novas

## 2018-06-09 NOTE — Telephone Encounter (Signed)
Ok to write letter:  Based on this patient's history of chronic severe asthma with tendency to flares related to viral infection including need for mechanical ventilation, she is at higher risk than the average patient  for respiratory complications from the  ZYTMM-21 pandemic.  She should be allowed to work from home and avoid as much as possible exposures outside of her house for the next 3 months or until the warning is downgraded by the CDC, whichever comes first.

## 2018-07-20 ENCOUNTER — Encounter: Payer: Self-pay | Admitting: Internal Medicine

## 2018-07-23 ENCOUNTER — Ambulatory Visit (INDEPENDENT_AMBULATORY_CARE_PROVIDER_SITE_OTHER): Payer: BLUE CROSS/BLUE SHIELD | Admitting: Internal Medicine

## 2018-07-23 ENCOUNTER — Other Ambulatory Visit: Payer: Self-pay

## 2018-07-23 DIAGNOSIS — F988 Other specified behavioral and emotional disorders with onset usually occurring in childhood and adolescence: Secondary | ICD-10-CM | POA: Diagnosis not present

## 2018-07-23 MED ORDER — AMPHETAMINE-DEXTROAMPHET ER 20 MG PO CP24: 20.0000 mg | ORAL_CAPSULE | Freq: Two times a day (BID) | ORAL | 0 refills | Status: DC

## 2018-07-23 NOTE — Progress Notes (Signed)
Virtual Visit via Video Note  I connected with Carrie [name] on 07/23/18 at  8:30 AM EDT by a video enabled telemedicine application and verified that I am speaking with the correct person using two identifiers.  Location patient: home Location provider: work office Persons participating in the virtual visit: patient, provider  I discussed the limitations of evaluation and management by telemedicine and the availability of in person appointments. The patient expressed understanding and agreed to proceed.   HPI: She has scheduled this visit for medication refills of her Adderall.  She was last given a prescription for 3 months in December.  She decided to take "some time off of it" to see if she really needed it.  Both her boss and her husband have noticed a difference, she seems to be repeating tasks at work that have already been completed, also has trouble finishing some tasks on time.  She has never had issues with this before.  Her sleep is good, she would like to go back on Adderall.  She has been taking Adderall XR 20 mg in the morning and if she has a busy workday will take a second 1 mid afternoon.  Other than this she has no acute complaints.   ROS: Constitutional: Denies fever, chills, diaphoresis, appetite change and fatigue.  HEENT: Denies photophobia, eye pain, redness, hearing loss, ear pain, congestion, sore throat, rhinorrhea, sneezing, mouth sores, trouble swallowing, neck pain, neck stiffness and tinnitus.   Respiratory: Denies SOB, DOE, cough, chest tightness,  and wheezing.   Cardiovascular: Denies chest pain, palpitations and leg swelling.  Gastrointestinal: Denies nausea, vomiting, abdominal pain, diarrhea, constipation, blood in stool and abdominal distention.  Genitourinary: Denies dysuria, urgency, frequency, hematuria, flank pain and difficulty urinating.  Endocrine: Denies: hot or cold intolerance, sweats, changes in hair or nails, polyuria, polydipsia.  Musculoskeletal: Denies myalgias, back pain, joint swelling, arthralgias and gait problem.  Skin: Denies pallor, rash and wound.  Neurological: Denies dizziness, seizures, syncope, weakness, light-headedness, numbness and headaches.  Hematological: Denies adenopathy. Easy bruising, personal or family bleeding history  Psychiatric/Behavioral: Denies suicidal ideation, mood changes, confusion, nervousness, sleep disturbance and agitation   Past Medical History:  Diagnosis Date  . A-fib ([place])   . ADD (attention deficit disorder)   . Adrenal abnormality ([place])   . Anemia, iron deficiency   . Asthma   . Diabetes mellitus without complication ([place])   . Fibromyalgia   . Hiatal hernia   . Numbness and tingling   . Obese   . Thyroid disease     Past Surgical History:  Procedure Laterality Date  . ABDOMINAL HYSTERECTOMY    . KNEE SURGERY    . SHOULDER SURGERY      Family History  Problem Relation Age of Onset  . Asthma Mother        seasonal  . Colon cancer Mother   . Lung cancer Maternal Uncle   . Esophageal cancer Father        smoker, mets to lungs  . Diabetes Sister     SOCIAL HX:   reports that she has never smoked. She has never used smokeless tobacco. She reports current alcohol use. She reports that she does not use drugs.   Current Outpatient Medications:  .  albuterol (PROVENTIL) (2.5 MG/3ML) 0.083% nebulizer solution, USE 1 VIAL VIA NEBULIZER EVERY 4 HOURS AS NEEDED FOR WHEEZE/SHORTNESS OF BREATH (Patient not taking: Reported on 05/07/2018), Disp: 75 mL, Rfl: 6 .  amphetamine-dextroamphetamine (ADDERALL XR)  20 MG 24 hr capsule, Take 1 capsule (20 mg total) by mouth 2 (two) times a day., Disp: 60 capsule, Rfl: 0 .  amphetamine-dextroamphetamine (ADDERALL XR) 20 MG 24 hr capsule, Take 1 capsule (20 mg total) by mouth 2 (two) times a day for 30 days., Disp: 60 capsule, Rfl: 0 .  amphetamine-dextroamphetamine (ADDERALL XR) 20 MG 24 hr capsule, Take 1 capsule (20 mg total)  by mouth 2 (two) times a day for 30 days., Disp: 60 capsule, Rfl: 0 .  budesonide-formoterol (SYMBICORT) 160-4.5 MCG/ACT inhaler, Inhale 2 puffs into the lungs 2 (two) times daily., Disp: 1 Inhaler, Rfl: 0 .  DULoxetine (CYMBALTA) 60 MG capsule, Take 1 capsule (60 mg total) by mouth daily., Disp: 90 capsule, Rfl: 4 .  famotidine (PEPCID) 20 MG tablet, One at bedtime, Disp: 90 tablet, Rfl: 3 .  gabapentin (NEURONTIN) 300 MG capsule, Take 1 capsule (300 mg total) by mouth 4 (four) times daily., Disp: 120 capsule, Rfl: 11 .  hydrochlorothiazide (HYDRODIURIL) 25 MG tablet, Take 25 mg by mouth daily., Disp: , Rfl:  .  levothyroxine (SYNTHROID, LEVOTHROID) 50 MCG tablet, Take 1 tablet (50 mcg total) by mouth daily before breakfast., Disp: 100 tablet, Rfl: 4 .  metFORMIN (GLUCOPHAGE-XR) 500 MG 24 hr tablet, Take 1 tablet (500 mg total) by mouth daily with breakfast., Disp: 100 tablet, Rfl: 4 .  montelukast (SINGULAIR) 10 MG tablet, 1 p.o. nightly, Disp: 100 tablet, Rfl: 4 .  pantoprazole (PROTONIX) 40 MG tablet, Take 30-60 min before first meal of the day, Disp: 90 tablet, Rfl: 11 .  PROAIR HFA 108 (90 Base) MCG/ACT inhaler, USE 2 INHALATIONS EVERY 6 HOURS AS NEEDED, Disp: 25.5 g, Rfl: 3 .  tolterodine (DETROL LA) 4 MG 24 hr capsule, 2 capsules daily, Disp: 180 capsule, Rfl: 1  EXAM:   VITALS per patient if applicable: None reported  GENERAL: alert, oriented, appears well and in no acute distress  HEENT: atraumatic, conjunttiva clear, no obvious abnormalities on inspection of external nose and ears  NECK: normal movements of the head and neck  LUNGS: on inspection no signs of respiratory distress, breathing rate appears normal, no obvious gross increased work of breathing, gasping or wheezing  CV: no obvious cyanosis  MS: moves all visible extremities without noticeable abnormality  PSYCH/NEURO: pleasant and cooperative, no obvious depression or anxiety, speech and thought processing grossly  intact  ASSESSMENT AND PLAN:   Attention deficit disorder, unspecified hyperactivity presence  -Will refill Adderall x3 months today.  Follow-up in 3 months stop    I discussed the assessment and treatment plan with the patient. The patient was provided an opportunity to ask questions and all were answered. The patient agreed with the plan and demonstrated an understanding of the instructions.   The patient was advised to call back or seek an in-person evaluation if the symptoms worsen or if the condition fails to improve as anticipated.    Lelon Frohlich, MD  [facility] at [facility]

## 2018-07-27 ENCOUNTER — Telehealth: Payer: Self-pay | Admitting: *Deleted

## 2018-07-27 NOTE — Telephone Encounter (Signed)
Left message on machine for patient to call back and schedule a 3 month follow up.  CRM

## 2018-08-03 ENCOUNTER — Encounter: Payer: Self-pay | Admitting: Internal Medicine

## 2018-08-06 DIAGNOSIS — E039 Hypothyroidism, unspecified: Secondary | ICD-10-CM | POA: Diagnosis not present

## 2018-08-06 DIAGNOSIS — E7800 Pure hypercholesterolemia, unspecified: Secondary | ICD-10-CM | POA: Diagnosis not present

## 2018-08-06 DIAGNOSIS — E119 Type 2 diabetes mellitus without complications: Secondary | ICD-10-CM | POA: Diagnosis not present

## 2018-08-13 DIAGNOSIS — E2749 Other adrenocortical insufficiency: Secondary | ICD-10-CM | POA: Diagnosis not present

## 2018-08-13 DIAGNOSIS — E7800 Pure hypercholesterolemia, unspecified: Secondary | ICD-10-CM | POA: Diagnosis not present

## 2018-08-13 DIAGNOSIS — E119 Type 2 diabetes mellitus without complications: Secondary | ICD-10-CM | POA: Diagnosis not present

## 2018-08-13 DIAGNOSIS — E039 Hypothyroidism, unspecified: Secondary | ICD-10-CM | POA: Diagnosis not present

## 2018-08-28 ENCOUNTER — Ambulatory Visit: Payer: BLUE CROSS/BLUE SHIELD | Admitting: Internal Medicine

## 2018-09-09 ENCOUNTER — Other Ambulatory Visit: Payer: Self-pay

## 2018-09-09 ENCOUNTER — Ambulatory Visit (INDEPENDENT_AMBULATORY_CARE_PROVIDER_SITE_OTHER): Payer: BC Managed Care – PPO | Admitting: Internal Medicine

## 2018-09-09 DIAGNOSIS — R42 Dizziness and giddiness: Secondary | ICD-10-CM | POA: Diagnosis not present

## 2018-09-09 DIAGNOSIS — Z20828 Contact with and (suspected) exposure to other viral communicable diseases: Secondary | ICD-10-CM | POA: Diagnosis not present

## 2018-09-09 DIAGNOSIS — R197 Diarrhea, unspecified: Secondary | ICD-10-CM | POA: Diagnosis not present

## 2018-09-09 DIAGNOSIS — R200 Anesthesia of skin: Secondary | ICD-10-CM | POA: Diagnosis not present

## 2018-09-09 NOTE — Progress Notes (Signed)
Virtual Visit via Video Note  I connected with Carrie [name] on 09/09/18 at  4:00 PM EDT by a video enabled telemedicine application and verified that I am speaking with the correct person using two identifiers.  Location patient: home Location provider: work office Persons participating in the virtual visit: patient, provider  I discussed the limitations of evaluation and management by telemedicine and the availability of in person appointments. The patient expressed understanding and agreed to proceed.   HPI: She has scheduled this visit to discuss some acute concerns. She was taken off all DM meds about 1 month ago by her endocrinologist. She has not checked her CBGs since. For about 4-5 days has been having peri-oral numbness, dizziness when turning head to the left or sleeping on her left side. Some ringing in her ears. Yesterday has some diarrhea. No fever, cough, SOB, but was apparently concerned about COVID so went and had a test done today (results currently pending). She states her BP was high and wanted to come in to the office, but was asked to do virtual instead given her recent COVID testing.  Home BP: 121/84, 161/89, 135/99, 138/96, 126/83, 126/91.   ROS: Constitutional: Denies fever, chills, diaphoresis, appetite change and fatigue.  HEENT: Denies photophobia, eye pain, redness, hearing loss, ear pain, congestion, sore throat, rhinorrhea, sneezing, mouth sores, trouble swallowing, neck pain, neck stiffness. Respiratory: Denies SOB, DOE, cough, chest tightness,  and wheezing.   Cardiovascular: Denies chest pain, palpitations and leg swelling.  Gastrointestinal: Denies nausea, vomiting, abdominal pain, diarrhea, constipation, blood in stool and abdominal distention.  Genitourinary: Denies dysuria, urgency, frequency, hematuria, flank pain and difficulty urinating.  Endocrine: Denies: hot or cold intolerance, sweats, changes in hair or nails, polyuria, polydipsia.  Musculoskeletal: Denies myalgias, back pain, joint swelling, arthralgias and gait problem.  Skin: Denies pallor, rash and wound.  Neurological: Denies dizziness, seizures, syncope, weakness, light-headedness, and headaches.  Hematological: Denies adenopathy. Easy bruising, personal or family bleeding history  Psychiatric/Behavioral: Denies suicidal ideation, mood changes, confusion, nervousness, sleep disturbance and agitation   Past Medical History:  Diagnosis Date  . A-fib ([name])   . ADD (attention deficit disorder)   . Adrenal abnormality ([place])   . Anemia, iron deficiency   . Asthma   . Diabetes mellitus without complication ([place])   . Fibromyalgia   . Hiatal hernia   . Numbness and tingling   . Obese   . Thyroid disease     Past Surgical History:  Procedure Laterality Date  . ABDOMINAL HYSTERECTOMY    . KNEE SURGERY    . SHOULDER SURGERY      Family History  Problem Relation Age of Onset  . Asthma Mother        seasonal  . Colon cancer Mother   . Lung cancer Maternal Uncle   . Esophageal cancer Father        smoker, mets to lungs  . Diabetes Sister     SOCIAL HX:   reports that she has never smoked. She has never used smokeless tobacco. She reports current alcohol use. She reports that she does not use drugs.   Current Outpatient Medications:  .  albuterol (PROVENTIL) (2.5 MG/3ML) 0.083% nebulizer solution, USE 1 VIAL VIA NEBULIZER EVERY 4 HOURS AS NEEDED FOR WHEEZE/SHORTNESS OF BREATH (Patient not taking: Reported on 05/07/2018), Disp: 75 mL, Rfl: 6 .  amphetamine-dextroamphetamine (ADDERALL XR) 20 MG 24 hr capsule, Take 1 capsule (20 mg total) by mouth 2 (two) times  a day., Disp: 60 capsule, Rfl: 0 .  amphetamine-dextroamphetamine (ADDERALL XR) 20 MG 24 hr capsule, Take 1 capsule (20 mg total) by mouth 2 (two) times a day for 30 days., Disp: 60 capsule, Rfl: 0 .  amphetamine-dextroamphetamine (ADDERALL XR) 20 MG 24 hr capsule, Take 1 capsule (20 mg total) by mouth  2 (two) times a day for 30 days., Disp: 60 capsule, Rfl: 0 .  budesonide-formoterol (SYMBICORT) 160-4.5 MCG/ACT inhaler, Inhale 2 puffs into the lungs 2 (two) times daily., Disp: 1 Inhaler, Rfl: 0 .  DULoxetine (CYMBALTA) 60 MG capsule, Take 1 capsule (60 mg total) by mouth daily., Disp: 90 capsule, Rfl: 4 .  famotidine (PEPCID) 20 MG tablet, One at bedtime, Disp: 90 tablet, Rfl: 3 .  gabapentin (NEURONTIN) 300 MG capsule, Take 1 capsule (300 mg total) by mouth 4 (four) times daily., Disp: 120 capsule, Rfl: 11 .  hydrochlorothiazide (HYDRODIURIL) 25 MG tablet, Take 25 mg by mouth daily., Disp: , Rfl:  .  levothyroxine (SYNTHROID, LEVOTHROID) 50 MCG tablet, Take 1 tablet (50 mcg total) by mouth daily before breakfast., Disp: 100 tablet, Rfl: 4 .  metFORMIN (GLUCOPHAGE-XR) 500 MG 24 hr tablet, Take 1 tablet (500 mg total) by mouth daily with breakfast., Disp: 100 tablet, Rfl: 4 .  montelukast (SINGULAIR) 10 MG tablet, 1 p.o. nightly, Disp: 100 tablet, Rfl: 4 .  pantoprazole (PROTONIX) 40 MG tablet, Take 30-60 min before first meal of the day, Disp: 90 tablet, Rfl: 11 .  PROAIR HFA 108 (90 Base) MCG/ACT inhaler, USE 2 INHALATIONS EVERY 6 HOURS AS NEEDED, Disp: 25.5 g, Rfl: 3 .  tolterodine (DETROL LA) 4 MG 24 hr capsule, 2 capsules daily, Disp: 180 capsule, Rfl: 1  EXAM:   VITALS per patient if applicable: BP 025/42 today  GENERAL: alert, oriented, appears well and in no acute distress  HEENT: atraumatic, conjunttiva clear, no obvious abnormalities on inspection of external nose and ears  NECK: normal movements of the head and neck  LUNGS: on inspection no signs of respiratory distress, breathing rate appears normal, no obvious gross increased work of breathing, gasping or wheezing  CV: no obvious cyanosis  MS: moves all visible extremities without noticeable abnormality  PSYCH/NEURO: pleasant and cooperative, no obvious depression or anxiety, speech and thought processing grossly intact   ASSESSMENT AND PLAN:   Perioral numbness Dizzy Diarrhea, unspecified type  -Etiology of symptoms unclear. -Needs to be seen in office, but agree we should wait until COVID-19 test has returned and hopefully negative. -We will need to check TSH and B12 as they may cause perioral numbness. -Also wonder about BPPV given dizziness when turning head to left and tinnitus (We can do D-H to diagnose in office and Epley to treat if so). -Not very concerned about ambulatory BP readings yet. -She is also advised to check her CBGs, as extreme readings could be causing some of her symptoms as well. -She will notify us once her COVID testing returns (not ordered thru our office).     I discussed the assessment and treatment plan with the patient. The patient was provided an opportunity to ask questions and all were answered. The patient agreed with the plan and demonstrated an understanding of the instructions.   The patient was advised to call back or seek an in-person evaluation if the symptoms worsen or if the condition fails to improve as anticipated.    Lelon Frohlich, MD  [facility] at [facility]

## 2018-09-10 ENCOUNTER — Encounter: Payer: Self-pay | Admitting: Internal Medicine

## 2018-09-10 DIAGNOSIS — R200 Anesthesia of skin: Secondary | ICD-10-CM

## 2018-09-14 ENCOUNTER — Telehealth: Payer: Self-pay

## 2018-09-14 NOTE — Telephone Encounter (Signed)
Pt called to schedule lab visit as directed, however there are no lab orders in the system. Based on LOV notes there are multiple testing recommendations. Pt would like to be called once orders are placed.

## 2018-09-15 ENCOUNTER — Other Ambulatory Visit: Payer: Self-pay

## 2018-09-15 ENCOUNTER — Other Ambulatory Visit (INDEPENDENT_AMBULATORY_CARE_PROVIDER_SITE_OTHER): Payer: BC Managed Care – PPO

## 2018-09-15 ENCOUNTER — Encounter: Payer: Self-pay | Admitting: Internal Medicine

## 2018-09-15 DIAGNOSIS — R200 Anesthesia of skin: Secondary | ICD-10-CM | POA: Diagnosis not present

## 2018-09-15 LAB — VITAMIN B12: Vitamin B-12: >1500 pg/mL — ABNORMAL HIGH (ref 211–911)

## 2018-09-15 LAB — TSH: TSH: 2.5 u[IU]/mL (ref 0.35–4.50)

## 2018-09-15 NOTE — Telephone Encounter (Signed)
Lab appointment scheduled 

## 2018-09-15 NOTE — Telephone Encounter (Signed)
Lab orders entered per last office notes and appt scheduled for today at 10am.

## 2018-09-17 ENCOUNTER — Encounter: Payer: Self-pay | Admitting: *Deleted

## 2018-09-17 ENCOUNTER — Ambulatory Visit (INDEPENDENT_AMBULATORY_CARE_PROVIDER_SITE_OTHER): Payer: BC Managed Care – PPO | Admitting: Internal Medicine

## 2018-09-17 ENCOUNTER — Encounter: Payer: Self-pay | Admitting: Internal Medicine

## 2018-09-17 ENCOUNTER — Other Ambulatory Visit: Payer: Self-pay

## 2018-09-17 DIAGNOSIS — J4530 Mild persistent asthma, uncomplicated: Secondary | ICD-10-CM

## 2018-09-17 NOTE — Patient Instructions (Signed)
Try to time your symbicort to before you brush your teeth with a baking soda based tooth paste   No change in medications  Only use your albuterol as a rescue medication to be used if you can't catch your breath by resting or doing a relaxed purse lip breathing pattern.  - The less you use it, the better it will work when you need it. - Ok to use up to 2 puffs  every 4 hours if you must but call for immediate appointment if use goes up over your usual need - Don't leave home without it !!  (think of it like the spare tire for your car)    Please schedule a follow up visit in 6  months but call sooner if needed

## 2018-09-17 NOTE — Progress Notes (Signed)
Subjective:    Patient ID: Carrie [name], female   DOB: 25-Jun-1960    MRN: [id number]     Brief patient profile:  20 yobf never smoker onset asthma age 58 and placed on shots for "everything that grows" needed to stay on inhalers / complicated by 3 admits requiring ventilator last time around age 38 and overall much better since then but intermittently on prednisone and as of 2011 required daily prednisone so referred to pulmonary clinic 02/13/2016 by Dr   Julianne Rice allergy shots but off  Around 2000    History of Present Illness  02/13/2016 1st [facility] office visit/ Carrie [name]  maint rx qvar 80 2bid/ singiulair/ pred 10  Chief Complaint  Patient presents with  . Pulmonary Consult    Referred by Dr. Sherren Mocha for eval of Asthma. Pt states that she has been on prednisone for years for her asthma. She is currently not having any symptoms.   for the last six years only comes off prednisone for a few days then back on it due to wheeze/ sob s much cough and presently on 10 mg daily and qvar 80 2 every 12 hours and no saba x sev weeks   Apparently doesn't taper below 10 in any increment Has intermittent hb on prn ppi  rec Pantoprazole (protonix) 40 mg   Take  30-60 min before first meal of the day and Pepcid (famotidine)  20 mg one @  bedtime until return to office    Qvar 80 Take 2 puffs first thing in am and then another 2 puffs about 12 hours later.  Work on inhaler technique:     Only use your albuterol as a rescue medication  Try Prednisone 5 mg daily x 2 weeks then  5 alternating with 2.5 mg (one-half pill) - if worse resume previous dose     04/16/2016  f/u ov/Carrie [name] re: steroid dep asthma with pred down to 5 mg daily / qvar 80 2bid / singulair /gerd rx  Chief Complaint  Patient presents with  . Follow-up    Breathing is overall doing well. She c/o minimal congestion "behind my nose"- just started today. She is on pred 5 mg daily and has not went any lower than that. She has  not had to use her proair.   Not limited by breathing from desired activities  / no saba use at all  Some nausea at the lower doses of pred but very happy with wt loss on the lower doses rec Take 5 mg one half daily x 2 weeks then one half even days x 2 weeks then one half every 3 days x 2 weeks and stop. If get worse nausea/ loss of appetite at any point go back up to higher dose  and alternate doses even/odd days    06/26/2016  f/u ov/Carrie [name] re:  Chronic  Asthma / on qvar 80 2bid / singulair/ gerd rx  and cortef per Suzette Battiest for adrenal insuff Chief Complaint  Patient presents with  . Follow-up    Pt c/o hoarseness from the pollen. Pt denies cough, wheezing, and SOB, CP/tightness.    saba once a week at most / some daytime only  throat clearing but o/w doing fine rec GERD diet  Plan A = Automatic = Qvar 80 Take 2 puffs first thing in am and then another 2 puffs about 12 hours later.  Plan B = Backup Only use your albuterol as a rescue   Plan  C = Crisis - only use your albuterol nebulizer if you first try Plan B and it fails to help > ok to use the nebulizer up to every 4 hours but if start needing it regularly call for immediate appointment         02/20/2018  f/u ov/Carrie [name] re: mild asthma reduced  pred down to pred 2 mg bid then asthma flared whereas  on 5mg  bid no breakthru Chief Complaint  Patient presents with  . Follow-up    Breathing is doing well today. She is using her albuterol inhaler several times per day. She rarely uses neb.  Dyspnea:  Not limited by breathing from desired activities  - needing saba sev times a week  Cough: no cough  Sleeping: wakes up 3-4 x week feeling wheezing > takes  albuterol and back to bed / last 12 hours prio  SABA use: sev times a day s pattern indoors vs outdoors, time of day or temp  02: none  Noct symptoms  worse off pepcid with overt noct hb  rec Stop qvar  Start symbicort 160 ake 2 puffs first thing in am and then another 2 puffs about  12 hours later.  Only use your albuterol as a rescue medication  Work on inhaler technique.     03/19/2018  f/u ov/Carrie [name] re: chronic asthma / off pred mid Dec 2019 / maint symb 160 2bid and no rescue on hand  Chief Complaint  Patient presents with  . Follow-up    Breathing is doing well and she has not used her albuterol inhaler recently. No new co's.   Dyspnea:  Not limited by breathing from desired activities / but by L knee Cough: no issue  Sleeping: ok now flat/ one pillow  SABA use: no rescue  rec Plan A = Automatic = symbicort 160 Take 2 puffs first thing in am and then another 2 puffs about 12 hours later.  Plan B = Backup Only use your albuterol inhaler as a rescue medication Plan C = Crisis - only use your albuterol nebulizer if you first try Plan B and it fails to help > ok to use the nebulizer up to every 4 hours but if start needing it regularly call for immediate appointment    09/17/2018  f/u ov/Carrie [name] re:  Asthma  Chief Complaint  Patient presents with  . Follow-up    Breathing is doing well. She has had a scratchy throat and so stopped using symbicort and then developed body aches and dizziness- started back on symbicort and symptoms resolved. She states she has to clear her throat often. She uses her rescue inhaler once per wk on average and rarely uses neb.   Dyspnea:  Not limited by breathing from desired activities  But by L knee pain Cough: none Sleeping: flat/ one pillow SABA use: once a week/ more when stopped symbicort 02: none Onset June 15th st, aches mostly no cough or fever > covid test June 17th pending in meantime all acute symptoms have resolved    No obvious day to day or daytime variability or assoc excess/ purulent sputum or mucus plugs or hemoptysis or cp or chest tightness, subjective wheeze or overt sinus or hb symptoms.   Sleeping  without nocturnal  or early am exacerbation  of respiratory  c/o's or need for noct saba. Also denies any obvious  fluctuation of symptoms with weather or environmental changes or other aggravating or alleviating factors except as outlined above  No unusual exposure hx or h/o childhood pna  or knowledge of premature birth.  Current Allergies, Complete Past Medical History, Past Surgical History, Family History, and Social History were reviewed in [employer] record.  ROS  The following are not active complaints unless bolded Hoarseness, sore throat, dysphagia, dental problems, itching, sneezing,  nasal congestion or discharge of excess mucus or purulent secretions, ear ache,   fever, chills, sweats, unintended wt loss or wt gain, classically pleuritic or exertional cp,  orthopnea pnd or arm/hand swelling  or leg swelling, presyncope, palpitations, abdominal pain, anorexia, nausea, vomiting, diarrhea  or change in bowel habits or change in bladder habits, change in stools or change in urine, dysuria, hematuria,  rash, arthralgias, visual complaints, headache, numbness, weakness or ataxia or problems with walking or coordination,  change in mood or  memory.        Current Meds  Medication Sig  . albuterol (PROVENTIL) (2.5 MG/3ML) 0.083% nebulizer solution USE 1 VIAL VIA NEBULIZER EVERY 4 HOURS AS NEEDED FOR WHEEZE/SHORTNESS OF BREATH  . budesonide-formoterol (SYMBICORT) 160-4.5 MCG/ACT inhaler Inhale 2 puffs into the lungs 2 (two) times daily.  . DULoxetine (CYMBALTA) 60 MG capsule Take 1 capsule (60 mg total) by mouth daily.  . famotidine (PEPCID) 20 MG tablet One at bedtime  . gabapentin (NEURONTIN) 300 MG capsule Take 1 capsule (300 mg total) by mouth 4 (four) times daily.  . hydrochlorothiazide (HYDRODIURIL) 25 MG tablet Take 25 mg by mouth daily.  Marland Kitchen levothyroxine (SYNTHROID) 75 MCG tablet Take 75 mcg by mouth daily before breakfast.  . montelukast (SINGULAIR) 10 MG tablet 1 p.o. nightly  . pantoprazole (PROTONIX) 40 MG tablet Take 30-60 min before first meal of the day  . PROAIR  HFA 108 (90 Base) MCG/ACT inhaler USE 2 INHALATIONS EVERY 6 HOURS AS NEEDED  . rosuvastatin (CRESTOR) 10 MG tablet Take 10 mg by mouth daily.  Marland Kitchen tolterodine (DETROL LA) 4 MG 24 hr capsule 2 capsules daily                 Objective:   Physical Exam     09/17/2018        203  03/19/2018         206  02/20/2018       203  12/27/2016     173  06/26/2016        188  04/16/2016       191   02/13/16 203 lb 3.2 oz (92.2 kg)  02/05/16 200 lb (90.7 kg)  01/24/16 200 lb (90.7 kg)     amb bf nad  Vital signs reviewed - Note on arrival 02 sats  100% on RA    HEENT: nl dentition, turbinates bilaterally, and oropharynx. Nl external ear canals without cough reflex   NECK :  without JVD/Nodes/TM/ nl carotid upstrokes bilaterally   LUNGS: no acc muscle use,  Nl contour chest which is clear to A and P bilaterally without cough on insp or exp maneuvers   CV:  RRR  no s3 or murmur or increase in P2, and no edema   ABD:  soft and nontender with nl inspiratory excursion in the supine position. No bruits or organomegaly appreciated, bowel sounds nl  MS:  Nl gait/ ext warm without deformities, calf tenderness, cyanosis or clubbing No obvious joint restrictions   SKIN: warm and dry without lesions    NEURO:  alert, approp, nl sensorium with  no motor or cerebellar deficits apparent.  Assessment:

## 2018-09-18 ENCOUNTER — Encounter: Payer: Self-pay | Admitting: Internal Medicine

## 2018-09-18 NOTE — Assessment & Plan Note (Addendum)
Onset age 58 Spirometry 02/13/2016  Nl x mid flows reduced to 56% and min curvature  pre - saba on qvar 80 2bid and pred 10 mg daily > tapered off as of 06/26/2016 only on physiologic steroids per dr Christeen Douglas guidance  - 12/27/2016     continue qvar 80 2bid - Spirometry 02/20/2018  FEV1 1.6 (78%)  Ratio 68 with mild curvature > 6 h since last saba  - FENO 02/20/2018  =   147 on qvar 80 2bid  - 02/20/2018    try step up to symb 160 2bid from qvar and off chronic pred mid dec 2019 - 09/17/2018  After extensive coaching inhaler device,  effectiveness =    90%    Not clear as to source of recent symptoms but doubt she had covid 19 and even if she did she's out about 14 days so should resume restrictions as per local government and cdc guidelines  All goals of chronic asthma control met including optimal function and elimination of symptoms with minimal need for rescue therapy.  Contingencies discussed in full including contacting this office immediately if not controlling the symptoms using the rule of two's.      I had an extended discussion with the patient reviewing all relevant studies completed to date and  lasting 15 to 20 minutes of a 25 minute visit    See device teaching which extended face to face time for this visit.  Each maintenance medication was reviewed in detail including emphasizing most importantly the difference between maintenance and prns and under what circumstances the prns are to be triggered using an action plan format that is not reflected in the computer generated alphabetically organized AVS which I have not found useful in most complex patients, especially with respiratory illnesses  Please see AVS for specific instructions unique to this visit that I personally wrote and verbalized to the the pt in detail and then reviewed with pt  by my nurse highlighting any  changes in therapy recommended at today's visit to their plan of care.      >>> f/u q 58m sooner if needed

## 2018-09-20 ENCOUNTER — Encounter: Payer: Self-pay | Admitting: Neurology

## 2018-10-13 DIAGNOSIS — R5383 Other fatigue: Secondary | ICD-10-CM | POA: Diagnosis not present

## 2018-10-13 DIAGNOSIS — R197 Diarrhea, unspecified: Secondary | ICD-10-CM | POA: Diagnosis not present

## 2018-10-13 DIAGNOSIS — E7800 Pure hypercholesterolemia, unspecified: Secondary | ICD-10-CM | POA: Diagnosis not present

## 2018-10-13 DIAGNOSIS — E039 Hypothyroidism, unspecified: Secondary | ICD-10-CM | POA: Diagnosis not present

## 2018-11-05 ENCOUNTER — Ambulatory Visit: Payer: BLUE CROSS/BLUE SHIELD | Admitting: Neurology

## 2018-11-11 ENCOUNTER — Ambulatory Visit (INDEPENDENT_AMBULATORY_CARE_PROVIDER_SITE_OTHER): Payer: BC Managed Care – PPO | Admitting: Internal Medicine

## 2018-11-11 ENCOUNTER — Other Ambulatory Visit: Payer: Self-pay

## 2018-11-11 ENCOUNTER — Encounter: Payer: Self-pay | Admitting: Internal Medicine

## 2018-11-11 VITALS — BP 110/74 | HR 84 | Temp 98.5°F | Wt 209.1 lb

## 2018-11-11 DIAGNOSIS — E1140 Type 2 diabetes mellitus with diabetic neuropathy, unspecified: Secondary | ICD-10-CM

## 2018-11-11 DIAGNOSIS — R200 Anesthesia of skin: Secondary | ICD-10-CM

## 2018-11-11 DIAGNOSIS — F988 Other specified behavioral and emotional disorders with onset usually occurring in childhood and adolescence: Secondary | ICD-10-CM | POA: Diagnosis not present

## 2018-11-11 DIAGNOSIS — H6123 Impacted cerumen, bilateral: Secondary | ICD-10-CM

## 2018-11-11 DIAGNOSIS — J4530 Mild persistent asthma, uncomplicated: Secondary | ICD-10-CM

## 2018-11-11 DIAGNOSIS — E669 Obesity, unspecified: Secondary | ICD-10-CM

## 2018-11-11 LAB — HEMOGLOBIN A1C: Hemoglobin A1C: 6

## 2018-11-11 NOTE — Progress Notes (Signed)
Established Patient Office Visit     CC/Reason for Visit: Follow-up and discuss some acute concerns  HPI: Carrie [name] is a 58 y.o. female who is coming in today for the above mentioned reasons.  She has a history of diabetes and stopped taking all diabetic medications at the instructions of her endocrinologist.  Per patient report her A1c was 6 in May and is scheduled to follow-up later this week.  Endocrinologist stopped her statin due to elevated LFTs 4 weeks ago and is due to have repeat blood work this week for this issue as well.  She has some dizziness when we last spoke on the phone but this has completely resolved.  She does have a "hollow sound" out of her right ear, "feels like I put my ear up into a shell".  When she was 67 months old she had significant facial trauma following a dog bite, she has some numbness and tingling occasionally of that area and has noticed recently that her lip is turning sideways and is having difficulty after prolonged speaking.  Past Medical/Surgical History: Past Medical History:  Diagnosis Date  . A-fib ([place])   . ADD (attention deficit disorder)   . Adrenal abnormality ([place])   . Anemia, iron deficiency   . Asthma   . Diabetes mellitus without complication ([place])   . Fibromyalgia   . Hiatal hernia   . Numbness and tingling   . Obese   . Thyroid disease     Past Surgical History:  Procedure Laterality Date  . ABDOMINAL HYSTERECTOMY    . KNEE SURGERY    . SHOULDER SURGERY      Social History:  reports that she has never smoked. She has never used smokeless tobacco. She reports current alcohol use. She reports that she does not use drugs.  Allergies: Allergies  Allergen Reactions  . Aspirin     REACTION: asthma- on respirator for 28 days  . Codeine Phosphate     REACTION: hives  . Influenza Vaccines     Soreness, increased asthma, side pain, trouble walking  . Penicillins     REACTION: wheezing and hives    Family  History:  Family History  Problem Relation Age of Onset  . Asthma Mother        seasonal  . Colon cancer Mother   . Lung cancer Maternal Uncle   . Esophageal cancer Father        smoker, mets to lungs  . Diabetes Sister      Current Outpatient Medications:  .  albuterol (PROVENTIL) (2.5 MG/3ML) 0.083% nebulizer solution, USE 1 VIAL VIA NEBULIZER EVERY 4 HOURS AS NEEDED FOR WHEEZE/SHORTNESS OF BREATH, Disp: 75 mL, Rfl: 6 .  amphetamine-dextroamphetamine (ADDERALL XR) 20 MG 24 hr capsule, Take 1 capsule (20 mg total) by mouth 2 (two) times a day., Disp: 60 capsule, Rfl: 0 .  budesonide-formoterol (SYMBICORT) 160-4.5 MCG/ACT inhaler, Inhale 2 puffs into the lungs 2 (two) times daily., Disp: 1 Inhaler, Rfl: 0 .  DULoxetine (CYMBALTA) 60 MG capsule, Take 1 capsule (60 mg total) by mouth daily., Disp: 90 capsule, Rfl: 4 .  famotidine (PEPCID) 20 MG tablet, One at bedtime, Disp: 90 tablet, Rfl: 3 .  gabapentin (NEURONTIN) 300 MG capsule, Take 1 capsule (300 mg total) by mouth 4 (four) times daily., Disp: 120 capsule, Rfl: 11 .  hydrochlorothiazide (HYDRODIURIL) 25 MG tablet, Take 25 mg by mouth daily., Disp: , Rfl:  .  levothyroxine (SYNTHROID) 75  MCG tablet, Take 75 mcg by mouth daily before breakfast., Disp: , Rfl:  .  montelukast (SINGULAIR) 10 MG tablet, 1 p.o. nightly, Disp: 100 tablet, Rfl: 4 .  pantoprazole (PROTONIX) 40 MG tablet, Take 30-60 min before first meal of the day, Disp: 90 tablet, Rfl: 11 .  PROAIR HFA 108 (90 Base) MCG/ACT inhaler, USE 2 INHALATIONS EVERY 6 HOURS AS NEEDED, Disp: 25.5 g, Rfl: 3 .  tolterodine (DETROL LA) 4 MG 24 hr capsule, 2 capsules daily, Disp: 180 capsule, Rfl: 1 .  amphetamine-dextroamphetamine (ADDERALL XR) 20 MG 24 hr capsule, Take 1 capsule (20 mg total) by mouth 2 (two) times a day for 30 days., Disp: 60 capsule, Rfl: 0 .  amphetamine-dextroamphetamine (ADDERALL XR) 20 MG 24 hr capsule, Take 1 capsule (20 mg total) by mouth 2 (two) times a day for 30  days., Disp: 60 capsule, Rfl: 0  Review of Systems:  Constitutional: Denies fever, chills, diaphoresis, appetite change and fatigue.  HEENT: Denies photophobia, eye pain, redness, hearing loss, ear pain, congestion, sore throat, rhinorrhea, sneezing, mouth sores, trouble swallowing, neck pain, neck stiffness and tinnitus.   Respiratory: Denies SOB, DOE, cough, chest tightness,  and wheezing.   Cardiovascular: Denies chest pain, palpitations and leg swelling.  Gastrointestinal: Denies nausea, vomiting, abdominal pain, diarrhea, constipation, blood in stool and abdominal distention.  Genitourinary: Denies dysuria, urgency, frequency, hematuria, flank pain and difficulty urinating.  Endocrine: Denies: hot or cold intolerance, sweats, changes in hair or nails, polyuria, polydipsia. Musculoskeletal: Denies myalgias, back pain, joint swelling, arthralgias and gait problem.  Skin: Denies pallor, rash and wound.  Neurological: Denies dizziness, seizures, syncope, weakness, light-headedness, numbness and headaches.  Hematological: Denies adenopathy. Easy bruising, personal or family bleeding history  Psychiatric/Behavioral: Denies suicidal ideation, mood changes, confusion, nervousness, sleep disturbance and agitation    Physical Exam: Vitals:   11/11/18 0809  BP: 110/74  Pulse: 84  Temp: 98.5 F (36.9 C)  TempSrc: Temporal  SpO2: 96%  Weight: 209 lb 1.6 oz (94.8 kg)    Body mass index is 37.64 kg/m.   Constitutional: NAD, calm, comfortable Eyes: PERRL, lids and conjunctivae normal ENMT: Mucous membranes are moist. Posterior pharynx clear of any exudate or lesions. Normal dentition. Tympanic membrane is obstructed by cerumen bilaterally.   Neck: normal, supple, no masses, no thyromegaly Respiratory: clear to auscultation bilaterally, no wheezing, no crackles. Normal respiratory effort. No accessory muscle use.  Cardiovascular: Regular rate and rhythm, no murmurs / rubs / gallops. No  extremity edema. 2+ pedal pulses.   Abdomen: no tenderness, no masses palpated. No hepatosplenomegaly. Bowel sounds positive.  Musculoskeletal: no clubbing / cyanosis. No joint deformity upper and lower extremities. Good ROM, no contractures. Normal muscle tone.  Skin: no rashes, lesions, ulcers. No induration Neurologic: Grossly intact and nonfocal Psychiatric: Normal judgment and insight. Alert and oriented x 3. Normal mood.    Impression and Plan:  Bilateral impacted cerumen -Cerumen Desimpaction  Warm water was applied and gentle ear lavage performed on bilateral ears. There were no complications and following the desimpaction the tympanic membranes were visible. Tympanic membranes are intact following the procedure. Auditory canals are normal. The patient reported relief of symptoms after removal of cerumen.   Type 2 diabetes mellitus with diabetic neuropathy, without long-term current use of insulin ([place]) -Well-controlled off medications with a recent A1c of 6.0 at her endocrinologist office.  Perioral numbness -She recently had a vitamin B12 and a TSH that were normal. -She has had significant cosmetic and  reconstructive surgery following a dog bite to the lower half of her face when she was 37 months old. -I suspect that her issues may be related to this. -She has an appointment next week with neurologist and she will discuss it with her, I have offered referral to plastic surgery for further evaluation which she will consider.  Attention deficit disorder, unspecified hyperactivity presence -She has been taking much less Adderall now that she is working from home, she is not requesting refills today.  Mild persistent asthma, uncomplicated -Well-controlled, no recent flareups  Obesity -Body mass index is 37.64 kg/m. -Discussed healthy lifestyle, including increased physical activity and better food choices to promote weight loss.    Patient Instructions  -Nice seeing you  today!!  -Schedule follow up for your physical. Please come in fasting that day.     Lelon Frohlich, MD [facility]

## 2018-11-11 NOTE — Patient Instructions (Signed)
-  Nice seeing you today!!  -Schedule follow up for your physical. Please come in fasting that day.   

## 2018-11-12 DIAGNOSIS — E7800 Pure hypercholesterolemia, unspecified: Secondary | ICD-10-CM | POA: Diagnosis not present

## 2018-11-16 ENCOUNTER — Encounter: Payer: Self-pay | Admitting: Internal Medicine

## 2018-11-16 ENCOUNTER — Ambulatory Visit: Payer: BLUE CROSS/BLUE SHIELD | Admitting: Neurology

## 2018-11-17 ENCOUNTER — Other Ambulatory Visit: Payer: Self-pay | Admitting: Internal Medicine

## 2018-11-17 DIAGNOSIS — M2612 Other jaw asymmetry: Secondary | ICD-10-CM

## 2018-11-20 ENCOUNTER — Other Ambulatory Visit: Payer: Self-pay | Admitting: Neurology

## 2018-11-30 DIAGNOSIS — M25562 Pain in left knee: Secondary | ICD-10-CM | POA: Diagnosis not present

## 2018-11-30 DIAGNOSIS — M1712 Unilateral primary osteoarthritis, left knee: Secondary | ICD-10-CM | POA: Diagnosis not present

## 2018-12-01 ENCOUNTER — Telehealth: Payer: Self-pay | Admitting: Internal Medicine

## 2018-12-01 NOTE — Telephone Encounter (Signed)
Pre-operative Clearance form to be filled out.  Please fax to:  [phone], Attn: Adline Potter

## 2018-12-02 NOTE — Telephone Encounter (Signed)
Placed in Dr Hernandez's folder 

## 2018-12-03 ENCOUNTER — Encounter: Payer: Self-pay | Admitting: Emergency Medicine

## 2018-12-03 NOTE — Telephone Encounter (Signed)
Ok to write same letter but put  Jan 4th 2021 which is her next appt with me as the time when we'll re-eval risks of returning to work, in meantime should be allowed to work from home if feasible.

## 2018-12-03 NOTE — Telephone Encounter (Signed)
Letter completed and faxed.   Magda Paganini, pt is asking about a pre-op clearance, do you have this?

## 2018-12-10 NOTE — Telephone Encounter (Signed)
Form completed, faxed, confirmed, and patient is aware.

## 2018-12-17 ENCOUNTER — Ambulatory Visit (INDEPENDENT_AMBULATORY_CARE_PROVIDER_SITE_OTHER): Payer: BC Managed Care – PPO | Admitting: Neurology

## 2018-12-17 ENCOUNTER — Encounter: Payer: Self-pay | Admitting: Neurology

## 2018-12-17 ENCOUNTER — Other Ambulatory Visit: Payer: Self-pay

## 2018-12-17 VITALS — BP 124/84 | HR 74 | Temp 97.5°F | Ht 63.5 in | Wt 213.0 lb

## 2018-12-17 DIAGNOSIS — M791 Myalgia, unspecified site: Secondary | ICD-10-CM

## 2018-12-17 MED ORDER — DULOXETINE HCL 60 MG PO CPEP: 60.0000 mg | ORAL_CAPSULE | Freq: Every day | ORAL | 1 refills | Status: DC

## 2018-12-17 NOTE — Progress Notes (Signed)
PATIENT: Carrie [name] DOB: 12-30-1960  REASON FOR VISIT: follow up HISTORY FROM: patient  HISTORY OF PRESENT ILLNESS: Today 12/17/18  HISTORY  HISTORY  Carrie [name] is a 58 year old female, seen in refer by his primary care doctor Stevie Kern for evaluation of numbness, initial evaluation was January 15 2017.  I reviewed and summarized the referring note, she has history of ADHD, atrial fibrillation, diabetes, fibromyalgia, asthma.  Because of her chronic asthma, breathing problem, she had been treated with prednisone for many years, recently tapered off prednisone 20 mg every day in July 2018, shortly after she stopped prednisone treatment, she noticed right shoulder deep achy pain, later spreading to bilateral shoulder, arm, anterior thigh, deep achy pain, numbness tingling, could not sleep, feel muscle strain, bilateral hip weakness, difficulty standing up,  She was put back on prednisone in October 2018 now taking 5 mg alternating with 10 mg, shortly afterwards, her symptoms disappeared, she is also taking Flexeril 10 mg at bedtime, which is helpful.  Laboratory evaluation March 2018, normal CMP, CBC, mild elevated LDL 146, A1c 6.1, TSH.   UPDATEMay 16, 2019 Laboratory evaluation showed positive ANA, otherwise normal ferritin, CPK, vitamin D, B12, ESR C-reactive protein,negative auto antibodies  She was treated with Cymbalta since October 2018, responding well, rarely have any deep muscle achy pain, she was also able to successfully taper off the prednisone. She denies significant muscle weakness  UPDATE May 07, 2018 SS She has a long standing history of asthma treated with prednisone. When tapering off prednisone, flare of asthma, she developed neuropathy. Has weaned of prednisone for 2 months now, close follow-up with pulmnology started on symbicort.  She reports her asthma is doing well. History of type 2 diabetes with diabetic polyneuropathy  well-controlled with an A1c of 5.24 December 2017.  EMG in November 2018 was mildly abnormal with evidence of median neuropathy across the wrist right worse than left.  There is no evidence of large fiber peripheral neuropathy or inflammatory myopathy.  She is taking 60 mg of Cymbalta daily.  She reports that Cymbalta has helped however she continues to have daily discomfort.  She reports that she will continue to have sharp jabs of pain to mostly her arms and occasionally her legs.  Throughout the day.  She reports that these episodes occur daily.  She denies any weakness or numbness.  She denies any change in her balance or any falls.  She denies neck pain.  She presents today for follow-up.  Update December 17, 2018 SS: She is working from home, her pulmonary doctor does not feel she should go back into the office. She isn't fond of working from home. She dull pain under her right arm, if overuse, she has pain in her arm the next day. The right arm still feel "weird" she wants to soothe it, rub it. She feels subjective weakness to her right hand. She has been this way since being off prednisone in 2018. She has bad asthma, had been on prednisone from her primary doctor for years in the past. She is taking Cymbalta 60 mg daily. She has no neck pain. She is thinking she wants to go back on small dose of prednisone to help with the achy pain in her right arm. She is planning to have a left knee replacement January 08, 2019.   REVIEW OF SYSTEMS: Out of a complete 14 system review of symptoms, the patient complains only of the following symptoms, and all other  reviewed systems are negative.  Appetite change, fatigue, swollen abdomen, joint pain, aching muscles, speech difficulty, facial drooping, depression  ALLERGIES: Allergies  Allergen Reactions  . Aspirin     REACTION: asthma- on respirator for 28 days  . Codeine Phosphate     REACTION: hives  . Influenza Vaccines     Soreness, increased asthma,  side pain, trouble walking  . Penicillins     REACTION: wheezing and hives    HOME MEDICATIONS: Outpatient Medications Prior to Visit  Medication Sig Dispense Refill  . albuterol (PROVENTIL) (2.5 MG/3ML) 0.083% nebulizer solution USE 1 VIAL VIA NEBULIZER EVERY 4 HOURS AS NEEDED FOR WHEEZE/SHORTNESS OF BREATH 75 mL 6  . amphetamine-dextroamphetamine (ADDERALL XR) 20 MG 24 hr capsule Take 1 capsule (20 mg total) by mouth 2 (two) times a day. 60 capsule 0  . budesonide-formoterol (SYMBICORT) 160-4.5 MCG/ACT inhaler Inhale 2 puffs into the lungs 2 (two) times daily. 1 Inhaler 0  . DULoxetine (CYMBALTA) 60 MG capsule TAKE ONE CAPSULE BY MOUTH DAILY 90 capsule 0  . famotidine (PEPCID) 20 MG tablet One at bedtime 90 tablet 3  . gabapentin (NEURONTIN) 300 MG capsule Take 1 capsule (300 mg total) by mouth 4 (four) times daily. 120 capsule 11  . hydrochlorothiazide (HYDRODIURIL) 25 MG tablet Take 25 mg by mouth daily.    Marland Kitchen levothyroxine (SYNTHROID) 75 MCG tablet Take 75 mcg by mouth daily before breakfast.    . montelukast (SINGULAIR) 10 MG tablet 1 p.o. nightly 100 tablet 4  . pantoprazole (PROTONIX) 40 MG tablet Take 30-60 min before first meal of the day 90 tablet 11  . PROAIR HFA 108 (90 Base) MCG/ACT inhaler USE 2 INHALATIONS EVERY 6 HOURS AS NEEDED 25.5 g 3  . tolterodine (DETROL LA) 4 MG 24 hr capsule 2 capsules daily 180 capsule 1  . amphetamine-dextroamphetamine (ADDERALL XR) 20 MG 24 hr capsule Take 1 capsule (20 mg total) by mouth 2 (two) times a day for 30 days. 60 capsule 0  . amphetamine-dextroamphetamine (ADDERALL XR) 20 MG 24 hr capsule Take 1 capsule (20 mg total) by mouth 2 (two) times a day for 30 days. 60 capsule 0   No facility-administered medications prior to visit.     PAST MEDICAL HISTORY: Past Medical History:  Diagnosis Date  . A-fib ([place])   . ADD (attention deficit disorder)   . Adrenal abnormality ([place])   . Anemia, iron deficiency   . Asthma   . Diabetes mellitus  without complication ([place])   . Fibromyalgia   . Hiatal hernia   . Numbness and tingling   . Obese   . Thyroid disease     PAST SURGICAL HISTORY: Past Surgical History:  Procedure Laterality Date  . ABDOMINAL HYSTERECTOMY    . KNEE SURGERY    . SHOULDER SURGERY      FAMILY HISTORY: Family History  Problem Relation Age of Onset  . Asthma Mother        seasonal  . Colon cancer Mother   . Lung cancer Maternal Uncle   . Esophageal cancer Father        smoker, mets to lungs  . Diabetes Sister     SOCIAL HISTORY: Social History   Socioeconomic History  . Marital status: Married    Spouse name: Not on file  . Number of children: 4  . Years of education: College  . Highest education level: Not on file  Occupational History  . Occupation: Programmer, applications  Social Needs  .  Financial resource strain: Not on file  . Food insecurity    Worry: Not on file    Inability: Not on file  . Transportation needs    Medical: Not on file    Non-medical: Not on file  Tobacco Use  . Smoking status: Never Smoker  . Smokeless tobacco: Never Used  Substance and Sexual Activity  . Alcohol use: Yes    Comment: occ  . Drug use: No  . Sexual activity: Not on file  Lifestyle  . Physical activity    Days per week: Not on file    Minutes per session: Not on file  . Stress: Not on file  Relationships  . Social Herbalist on phone: Not on file    Gets together: Not on file    Attends religious service: Not on file    Active member of club or organization: Not on file    Attends meetings of clubs or organizations: Not on file    Relationship status: Not on file  . Intimate partner violence    Fear of current or ex partner: Not on file    Emotionally abused: Not on file    Physically abused: Not on file    Forced sexual activity: Not on file  Other Topics Concern  . Not on file  Social History Narrative   Lives at home with husband.   Right-handed.   1 cup caffeine per  day.    PHYSICAL EXAM  Vitals:   12/17/18 0754  BP: 124/84  Pulse: 74  Temp: (!) 97.5 F (36.4 C)  Weight: 213 lb (96.6 kg)  Height: 5' 3.5" (1.613 m)   Body mass index is 37.14 kg/m.  Generalized: Well developed, in no acute distress   Neurological examination  Mentation: Alert oriented to time, place, history taking. Follows all commands speech and language fluent Cranial nerve II-XII: Pupils were equal round reactive to light. Extraocular movements were full, visual field were full on confrontational test. Facial sensation and strength were normal. Head turning and shoulder shrug  were normal and symmetric. Motor: The motor testing reveals 5 over 5 strength of all 4 extremities. Good symmetric motor tone is noted throughout.  Sensory: Sensory testing is intact to soft touch on all 4 extremities. No evidence of extinction is noted.  Coordination: Cerebellar testing reveals good finger-nose-finger and heel-to-shin bilaterally.  Gait and station: Gait is normal. Tandem gait is normal. Romberg is negative. No drift is seen.  Reflexes: Deep tendon reflexes are symmetric and normal bilaterally.   DIAGNOSTIC DATA (LABS, IMAGING, TESTING) - I reviewed patient records, labs, notes, testing and imaging myself where available.  Lab Results  Component Value Date   WBC 6.4 01/06/2018   HGB 13.9 01/06/2018   HCT 41.6 01/06/2018   MCV 89.5 01/06/2018   PLT 329.0 01/06/2018      Component Value Date/Time   NA 140 01/06/2018 1558   NA 142 04/09/2016   K 4.2 01/06/2018 1558   CL 102 01/06/2018 1558   CO2 28 01/06/2018 1558   GLUCOSE 129 (H) 01/06/2018 1558   BUN 8 01/06/2018 1558   BUN 9 04/09/2016   CREATININE 0.63 01/06/2018 1558   CALCIUM 9.9 01/06/2018 1558   PROT 7.1 01/06/2018 1558   ALBUMIN 4.4 01/06/2018 1558   AST 14 01/06/2018 1558   ALT 18 01/06/2018 1558   ALKPHOS 87 01/06/2018 1558   BILITOT 0.5 01/06/2018 1558   GFRNONAA >60 02/05/2016 1819  GFRAA >60  02/05/2016 1819   Lab Results  Component Value Date   CHOL 230 (H) 01/06/2018   HDL 56.60 01/06/2018   LDLCALC 156 (H) 01/06/2018   LDLDIRECT 143.6 04/17/2006   TRIG 85.0 01/06/2018   CHOLHDL 4 01/06/2018   Lab Results  Component Value Date   HGBA1C 6.0 11/11/2018   Lab Results  Component Value Date   VITAMINB12 >1500 (H) 09/15/2018   Lab Results  Component Value Date   TSH 2.50 09/15/2018    ASSESSMENT AND PLAN 58 y.o. year old female  has a past medical history of A-fib ([place]), ADD (attention deficit disorder), Adrenal abnormality ([place]), Anemia, iron deficiency, Asthma, Diabetes mellitus without complication ([place]), Fibromyalgia, Hiatal hernia, Numbness and tingling, Obese, and Thyroid disease. here with:  1.  Paresthesia, achy pain  -Continues to complain of paresthesia, aching pain in her right arm -EMG evaluation showed evidence of median neuropathy across the wrist, consistent with mild bilateral carpal tunnel syndrome, right worse than left, no evidence of large fiber peripheral neuropathy or inflammatory myopathy -She remains on Cymbalta 60 mg daily, gabapentin 300 mg 3 times a day (prescribed 4 times a day, only taking 3 times a day) -Symptoms have been persistent since stopping long-term prednisone in 2018, initially had good response to Cymbalta and gabapentin -She is planning to have upcoming knee replacement surgery  -She would like to go back on prednisone for trial, I discussed this with Dr. Krista Blue, does not feel that is best treatment plan at this time, given her age, risk factor of being on prednisone, patient is agreeable, will continue Cymbalta, gabapentin for now -ESR, CRP was normal in the past  -Follow-up in 3 to 4 months with Dr. Krista Blue, wanted to discuss some new issues at that time   I spent 15 minutes with the patient. 50% of this time was spent discussing her plan of care.  Butler Denmark, AGNP-C, DNP 12/17/2018, 8:01 AM [facility] [address] [place], [place] [address] [phone]

## 2018-12-17 NOTE — Progress Notes (Signed)
I have reviewed and agreed above plan. 

## 2018-12-21 ENCOUNTER — Encounter: Payer: Self-pay | Admitting: Neurology

## 2018-12-22 ENCOUNTER — Other Ambulatory Visit: Payer: Self-pay | Admitting: Neurology

## 2018-12-22 MED ORDER — GABAPENTIN 300 MG PO CAPS: ORAL_CAPSULE | ORAL | 11 refills | Status: DC

## 2018-12-22 NOTE — Progress Notes (Signed)
She has sent a my chart message asking for increase in gabapentin. She tried 600 mg in the am and pm, 300 mg midday and that was effective for her achy pain in her shoulder. I will adjust the rx. She was taking 300 mg 4 times a day.

## 2018-12-23 NOTE — Telephone Encounter (Addendum)
I called [facility] spoke to DAVID for PA on Gabapentin 300mg  caps #150 / 30 day supply.  [phone].  Approval given [id number] until 12/23/2019.  [facility] ID # [id number].  Received approval letter and fax confirmation to [facility] in [place] [phone].  Relayed to pt that approved and letter faxed to pharmacy.

## 2018-12-28 NOTE — Progress Notes (Signed)
Please add surgery orders. Pt is scheduled for her PAT appt tomorrow. Thank you.

## 2018-12-28 NOTE — Patient Instructions (Addendum)
DUE TO COVID-19 ONLY ONE VISITOR IS ALLOWED TO COME WITH YOU AND STAY IN THE WAITING ROOM ONLY DURING PRE OP AND PROCEDURE DAY OF SURGERY. THE 1 VISITOR MAY VISIT WITH YOU AFTER SURGERY IN YOUR PRIVATE ROOM DURING VISITING HOURS ONLY!  YOU NEED TO HAVE A COVID 19 TEST ON 01-12-19  @ 8:20 AM, THIS TEST MUST BE DONE BEFORE SURGERY, COME  [place], [place] [place] , [address].  ([place]) ONCE YOUR COVID TEST IS COMPLETED, PLEASE BEGIN THE QUARANTINE INSTRUCTIONS AS OUTLINED IN YOUR HANDOUT.                Carrie [name]  12/28/2018   Your procedure is scheduled on: 01-15-19     Report to [facility] Main  Entrance    Report to Admitting at 8:00 AM     Call this number if you have problems the morning of surgery [phone]    Remember: NO SOLID FOOD AFTER MIDNIGHT THE NIGHT PRIOR TO SURGERY. NOTHING BY MOUTH EXCEPT CLEAR LIQUIDS UNTIL 7:30 AM . PLEASE FINISH G2 DRINK PER SURGEON ORDER  WHICH NEEDS TO BE COMPLETED AT 7:30 AM .   CLEAR LIQUID DIET   Foods Allowed                                                                     Foods Excluded  Coffee and tea, regular and decaf                             liquids that you cannot  Plain Jell-O any favor except red or purple                                           see through such as: Fruit ices (not with fruit pulp)                                     milk, soups, orange juice  Iced Popsicles                                    All solid food Carbonated beverages, regular and diet                                    Cranberry, grape and apple juices Sports drinks like Gatorade Lightly seasoned clear broth or consume(fat free) Sugar, honey syrup   _____________________________________________________________________      Take these medicines the morning of surgery with A SIP OF WATER: Adderall-XR, Duloxetine (Cymbalta), Gabapentin (Neurontin), Levothyroxine (Synthroid), and Pantoprazole (Protonix).  You can also use your inhaler  BRUSH YOUR TEETH MORNING OF SURGERY AND RINSE YOUR MOUTH OUT, NO CHEWING GUM CANDY OR MINTS.  You may not have any metal on your body including hair pins and              piercings     Do not wear jewelry, make-up, lotions, powders or perfumes, deodorant              Do not wear nail polish on your fingernails.  Do not shave  48 hours prior to surgery.               Do not bring valuables to the hospital. [place].  Contacts, dentures or bridgework may not be worn into surgery.  You may bring a small suitcase.        Special Instructions: N/A              Please read over the following fact sheets you were given: _____________________________________________________________________             [facility] - Preparing for Surgery Before surgery, you can play an important role.  Because skin is not sterile, your skin needs to be as free of germs as possible.  You can reduce the number of germs on your skin by washing with CHG (chlorahexidine gluconate) soap before surgery.  CHG is an antiseptic cleaner which kills germs and bonds with the skin to continue killing germs even after washing. Please DO NOT use if you have an allergy to CHG or antibacterial soaps.  If your skin becomes reddened/irritated stop using the CHG and inform your nurse when you arrive at Short Stay. Do not shave (including legs and underarms) for at least 48 hours prior to the first CHG shower.  You may shave your face/neck. Please follow these instructions carefully:  1.  Shower with CHG Soap the night before surgery and the  morning of Surgery.  2.  If you choose to wash your hair, wash your hair first as usual with your  normal  shampoo.  3.  After you shampoo, rinse your hair and body thoroughly to remove the  shampoo.                           4.  Use CHG as you would any other liquid soap.  You  can apply chg directly  to the skin and wash                       Gently with a scrungie or clean washcloth.  5.  Apply the CHG Soap to your body ONLY FROM THE NECK DOWN.   Do not use on face/ open                           Wound or open sores. Avoid contact with eyes, ears mouth and genitals (private parts).                       Wash face,  Genitals (private parts) with your normal soap.             6.  Wash thoroughly, paying special attention to the area where your surgery  will be performed.  7.  Thoroughly rinse your body with warm water from the neck down.  8.  DO NOT shower/wash with your normal soap after using and rinsing  off  the CHG Soap.                9.  Pat yourself dry with a clean towel.            10.  Wear clean pajamas.            11.  Place clean sheets on your bed the night of your first shower and do not  sleep with pets. Day of Surgery : Do not apply any lotions/deodorants the morning of surgery.  Please wear clean clothes to the hospital/surgery center.  FAILURE TO FOLLOW THESE INSTRUCTIONS MAY RESULT IN THE CANCELLATION OF YOUR SURGERY PATIENT SIGNATURE_________________________________  NURSE SIGNATURE__________________________________  ________________________________________________________________________

## 2018-12-29 ENCOUNTER — Encounter: Payer: Self-pay | Admitting: Internal Medicine

## 2018-12-29 ENCOUNTER — Encounter (HOSPITAL_COMMUNITY)
Admission: RE | Admit: 2018-12-29 | Discharge: 2018-12-29 | Disposition: A | Discharge status: Home / Self Care | Payer: BC Managed Care – PPO | Source: Ambulatory Visit | Attending: Specialist | Admitting: Specialist

## 2018-12-29 ENCOUNTER — Encounter (HOSPITAL_COMMUNITY): Payer: Self-pay

## 2018-12-29 ENCOUNTER — Other Ambulatory Visit: Payer: Self-pay

## 2018-12-29 DIAGNOSIS — E118 Type 2 diabetes mellitus with unspecified complications: Secondary | ICD-10-CM | POA: Diagnosis not present

## 2018-12-29 DIAGNOSIS — I10 Essential (primary) hypertension: Secondary | ICD-10-CM | POA: Diagnosis not present

## 2018-12-29 DIAGNOSIS — M1712 Unilateral primary osteoarthritis, left knee: Secondary | ICD-10-CM | POA: Insufficient documentation

## 2018-12-29 DIAGNOSIS — Z01818 Encounter for other preprocedural examination: Secondary | ICD-10-CM | POA: Diagnosis not present

## 2018-12-29 LAB — BASIC METABOLIC PANEL
Anion gap: 10 (ref 5–15)
BUN: 8 mg/dL (ref 6–20)
CO2: 29 mmol/L (ref 22–32)
Calcium: 9.2 mg/dL (ref 8.9–10.3)
Chloride: 103 mmol/L (ref 98–111)
Creatinine, Ser: 0.64 mg/dL (ref 0.44–1.00)
GFR calc Af Amer: >60 mL/min (ref >60)
GFR calc non Af Amer: >60 mL/min (ref >60)
Glucose, Bld: 126 mg/dL — ABNORMAL HIGH (ref 70–99)
Potassium: 3.9 mmol/L (ref 3.5–5.1)
Sodium: 142 mmol/L (ref 135–145)

## 2018-12-29 LAB — CBC
HCT: 41.8 % (ref 36.0–46.0)
Hemoglobin: 12.6 g/dL (ref 12.0–15.0)
MCH: 27.5 pg (ref 26.0–34.0)
MCHC: 30.1 g/dL (ref 30.0–36.0)
MCV: 91.1 fL (ref 80.0–100.0)
Platelets: 303 10*3/uL (ref 150–400)
RBC: 4.59 MIL/uL (ref 3.87–5.11)
RDW: 14.7 % (ref 11.5–15.5)
WBC: 5 10*3/uL (ref 4.0–10.5)
nRBC: 0 % (ref 0.0–0.2)

## 2018-12-29 LAB — SURGICAL PCR SCREEN
MRSA, PCR: NEGATIVE
Staphylococcus aureus: NEGATIVE

## 2018-12-29 LAB — GLUCOSE, CAPILLARY: Glucose-Capillary: 126 mg/dL — ABNORMAL HIGH (ref 70–99)

## 2018-12-29 LAB — HEMOGLOBIN A1C
Hgb A1c MFr Bld: 6.4 % — ABNORMAL HIGH (ref 4.8–5.6)
Mean Plasma Glucose: 136.98 mg/dL

## 2018-12-31 ENCOUNTER — Encounter: Payer: Self-pay | Admitting: Internal Medicine

## 2018-12-31 ENCOUNTER — Telehealth: Payer: Self-pay

## 2018-12-31 DIAGNOSIS — F988 Other specified behavioral and emotional disorders with onset usually occurring in childhood and adolescence: Secondary | ICD-10-CM

## 2018-12-31 MED ORDER — AMPHETAMINE-DEXTROAMPHET ER 20 MG PO CP24: 20.0000 mg | ORAL_CAPSULE | Freq: Two times a day (BID) | ORAL | 0 refills | Status: DC

## 2018-12-31 NOTE — Telephone Encounter (Signed)

## 2019-01-01 ENCOUNTER — Telehealth: Payer: Self-pay | Admitting: Internal Medicine

## 2019-01-01 ENCOUNTER — Encounter: Payer: Self-pay | Admitting: Plastic Surgery

## 2019-01-01 ENCOUNTER — Ambulatory Visit (INDEPENDENT_AMBULATORY_CARE_PROVIDER_SITE_OTHER): Payer: BC Managed Care – PPO | Admitting: Plastic Surgery

## 2019-01-01 ENCOUNTER — Other Ambulatory Visit: Payer: Self-pay

## 2019-01-01 DIAGNOSIS — M26622 Arthralgia of left temporomandibular joint: Secondary | ICD-10-CM | POA: Diagnosis not present

## 2019-01-01 DIAGNOSIS — M952 Other acquired deformity of head: Secondary | ICD-10-CM | POA: Diagnosis not present

## 2019-01-01 DIAGNOSIS — Z719 Counseling, unspecified: Secondary | ICD-10-CM

## 2019-01-01 DIAGNOSIS — R220 Localized swelling, mass and lump, head: Secondary | ICD-10-CM | POA: Diagnosis not present

## 2019-01-01 NOTE — Progress Notes (Signed)
Botulinum Toxin Procedure Note  Procedure: Cosmetic botulinum toxin   Pre-operative Diagnosis: Dynamic rhytides  Post-operative Diagnosis: Same  Complications:  None  Brief history: The patient desires botulinum toxin injection of her forehead. I discussed with the patient this proposed procedure of botulinum toxin injections, which is customized depending on the particular needs of the patient. It is performed on facial rhytids as a temporary correction. The alternatives were discussed with the patient. The risks were addressed including bleeding, scarring, infection, damage to deeper structures, asymmetry, and chronic pain, which may occur infrequently after a procedure. The individual's choice to undergo a surgical procedure is based on the comparison of risks to potential benefits. Other risks include unsatisfactory results, brow ptosis, eyelid ptosis, allergic reaction, temporary paralysis, which should go away with time, bruising, blurring disturbances and delayed healing. Botulinum toxin injections do not arrest the aging process or produce permanent tightening of the eyelid.  Operative intervention maybe necessary to maintain the results of a blepharoplasty or botulinum toxin. The patient understands and wishes to proceed. An informed consent was signed and informational brochures given to her prior to the procedure.  Procedure: The area was prepped with alcohol and dried with a clean gauze. Using a clean technique, the botulinum toxin was diluted with 1.25 cc of preservative-free normal saline which was slowly injected with an 18 gauge needle in a tuberculin syringes.  A 32 gauge needles were then used to inject the botulinum toxin. This mixture allow for an aliquot of 5 units per 0.1 cc in each injection site.    Subsequently the mixture was injected in the glabellar and forehead area with preservation of the temporal branch to the lateral eyebrow as well as into each lateral canthal area  beginning from the lateral orbital rim medial to the zygomaticus major in 3 separate areas. A total of 20 Units of botulinum toxin was used. The forehead and glabellar area was injected with care to inject intramuscular only while holding pressure on the supratrochlear vessels in each area during each injection on either side of the medial corrugators. The injection proceeded vertically superiorly to the medial 2/3 of the frontalis muscle and superior 2/3 of the lateral frontalis, again with preservation of the frontal branch.  No complications were noted. Light pressure was held for 5 minutes. She was instructed explicitly in post-operative care.  Botox LOT:  PT:[id number] C2 EXP:  5/23

## 2019-01-01 NOTE — Progress Notes (Signed)
Patient ID: Carrie [name], female    DOB: 12-Nov-1960, 58 y.o.   MRN: [id number]   Chief Complaint  Patient presents with  . Advice Only    for jaw asymmetry    The patient is a 58 year old female valuation of her face.  The patient states that about 8 months ago her daughter told her that she had facial asymmetry.  She started to notice it.  She denies any pain in her face.  No clicking in her jaws.  No trauma that she is aware of.  Her mouth opens normally.  Upon closure her jaw deviates to the left and then the midline position on occlusion.  This is noticeable when she talks as well.  She also has a little bit of swelling on the left side near her TMJ area.  She had a severe dog bite when she was a child.  That was repaired and has since been revised by Dr. Towanda Malkin.   Review of Systems  Constitutional: Negative for activity change and appetite change.  HENT: Positive for facial swelling. Negative for dental problem, drooling and sinus pain.   Eyes: Negative.   Respiratory: Negative.   Cardiovascular: Negative.  Negative for leg swelling.  Gastrointestinal: Negative for abdominal pain.  Endocrine: Negative.   Musculoskeletal: Negative.   Neurological: Negative.   Hematological: Negative.   Psychiatric/Behavioral: Negative.     Past Medical History:  Diagnosis Date  . A-fib ([place])   . ADD (attention deficit disorder)   . Adrenal abnormality ([place])   . Anemia, iron deficiency   . Asthma   . Diabetes mellitus without complication ([place])   . Fibromyalgia   . Hiatal hernia   . Numbness and tingling   . Obese   . Thyroid disease     Past Surgical History:  Procedure Laterality Date  . ABDOMINAL HYSTERECTOMY    . KNEE SURGERY    . SHOULDER SURGERY        Current Outpatient Medications:  .  acetaminophen (TYLENOL) 500 MG tablet, Take 1,000 mg by mouth every 6 (six) hours as needed for mild pain or moderate pain., Disp: , Rfl:  .  albuterol (PROVENTIL) (2.5 MG/3ML)  0.083% nebulizer solution, USE 1 VIAL VIA NEBULIZER EVERY 4 HOURS AS NEEDED FOR WHEEZE/SHORTNESS OF BREATH (Patient taking differently: Take 2.5 mg by nebulization every 4 (four) hours as needed for wheezing or shortness of breath. ), Disp: 75 mL, Rfl: 6 .  amphetamine-dextroamphetamine (ADDERALL XR) 20 MG 24 hr capsule, Take 1 capsule (20 mg total) by mouth 2 (two) times daily., Disp: 60 capsule, Rfl: 0 .  amphetamine-dextroamphetamine (ADDERALL XR) 20 MG 24 hr capsule, Take 1 capsule (20 mg total) by mouth 2 (two) times daily., Disp: 60 capsule, Rfl: 0 .  amphetamine-dextroamphetamine (ADDERALL XR) 20 MG 24 hr capsule, Take 1 capsule (20 mg total) by mouth 2 (two) times daily., Disp: 60 capsule, Rfl: 0 .  budesonide-formoterol (SYMBICORT) 160-4.5 MCG/ACT inhaler, Inhale 2 puffs into the lungs 2 (two) times daily., Disp: 1 Inhaler, Rfl: 0 .  DULoxetine (CYMBALTA) 60 MG capsule, Take 1 capsule (60 mg total) by mouth daily. (Patient taking differently: Take 60 mg by mouth daily. ), Disp: 90 capsule, Rfl: 1 .  famotidine (PEPCID) 20 MG tablet, One at bedtime, Disp: 90 tablet, Rfl: 3 .  gabapentin (NEURONTIN) 300 MG capsule, Take 2 in the morning, 1 midday, take 2 in the evening (Patient taking differently: Take 300 mg by mouth See  admin instructions. Take 600 mg  in the morning, 300 mg midday, take 600 mg in the evening), Disp: 150 capsule, Rfl: 11 .  levothyroxine (SYNTHROID) 75 MCG tablet, Take 75 mcg by mouth daily before breakfast., Disp: , Rfl:  .  montelukast (SINGULAIR) 10 MG tablet, 1 p.o. nightly (Patient taking differently: Take 10 mg by mouth at bedtime. ), Disp: 100 tablet, Rfl: 4 .  pantoprazole (PROTONIX) 40 MG tablet, Take 30-60 min before first meal of the day (Patient taking differently: Take 40 mg by mouth daily before breakfast. Take 30-60 min before first meal of the day), Disp: 90 tablet, Rfl: 11 .  PROAIR HFA 108 (90 Base) MCG/ACT inhaler, USE 2 INHALATIONS EVERY 6 HOURS AS NEEDED  (Patient taking differently: Inhale 2 puffs into the lungs every 6 (six) hours as needed for shortness of breath. ), Disp: 25.5 g, Rfl: 3 .  tolterodine (DETROL LA) 4 MG 24 hr capsule, 2 capsules daily, Disp: 180 capsule, Rfl: 1   Objective:   Vitals:   01/01/19 0805  BP: 115/75  Pulse: 90  Temp: (!) 97.3 F (36.3 C)  SpO2: 93%    Physical Exam Vitals signs and nursing note reviewed.  Constitutional:      Appearance: Normal appearance.  HENT:     Head: Normocephalic.  Cardiovascular:     Rate and Rhythm: Normal rate.  Pulmonary:     Effort: Pulmonary effort is normal. No respiratory distress.  Abdominal:     General: Abdomen is flat. There is no distension.  Skin:    General: Skin is warm.  Neurological:     General: No focal deficit present.     Mental Status: She is alert and oriented to person, place, and time.  Psychiatric:        Mood and Affect: Mood normal.        Behavior: Behavior normal.        Thought Content: Thought content normal.     Assessment & Plan:  Facial asymmetry, acquired  Recommend consultation with an oral surgeon.  I reached out to Dr. Mancel Parsons who is willing to see the patient.  Request has been made for a CT maxillofacial exam with 3D reconstruction for further evaluation.  She may need an MRI after that for further evaluation of her TMJ. The patient acknowledges understanding and agreeing with this plan. Pictures were obtained of the patient and placed in the chart with the patient's or guardian's permission.   [place], DO

## 2019-01-01 NOTE — Telephone Encounter (Signed)
Carrie [name] with Dr. Theda Sers office called in to request to have pt's clearance form faxed to them, pt is having a knee replacement and is scheduled for Monday. Please fax as soon as possible to:    Fax: [phone]   Phone: [phone]

## 2019-01-01 NOTE — Telephone Encounter (Signed)
refaxed and confirmed

## 2019-01-04 NOTE — H&P (Signed)
TOTAL KNEE ADMISSION H&P  Patient is being admitted for left total knee arthroplasty.  Subjective:  Chief Complaint:left knee pain.  HPI: Carrie [name], 58 y.o. female, has a history of pain and functional disability in the left knee due to arthritis and has failed non-surgical conservative treatments for greater than 12 weeks to includeNSAID's and/or analgesics, corticosteriod injections and viscosupplementation injections.  Onset of symptoms was gradual, starting 4 years ago with gradually worsening course since that time. The patient noted prior procedures on the knee to include  arthroscopy on the left knee(s).  Patient currently rates pain in the left knee(s) at 4 out of 10 with activity. Patient has night pain, worsening of pain with activity and weight bearing, pain that interferes with activities of daily living, pain with passive range of motion and crepitus.  Patient has evidence of periarticular osteophytes and joint space narrowing by imaging studies.There is no active infection.  Patient Active Problem List   Diagnosis Date Noted  . Facial asymmetry, acquired 01/01/2019  . Routine general medical examination at a health care facility 01/06/2018  . Muscle ache 07/31/2017  . Bilateral arm numbness and tingling while sleeping 11/26/2016  . Upper airway cough syndrome 06/27/2016  . Elevated blood sugar 01/17/2016  . Family history of colon cancer 06/14/2014  . Gastroesophageal reflux disease with hiatal hernia 06/02/2014  . Pain of left shoulder region 06/03/2013  . Incontinence 02/05/2011  . UNSPECIFIED INTERNAL DERANGEMENT OF KNEE 02/01/2010  . DEGENERATIVE DISC DISEASE, CERVICAL SPINE, W/RADICULOPATHY 04/21/2009  . Attention deficit disorder 11/30/2007  . Mild persistent asthma, uncomplicated [id number]   Past Medical History:  Diagnosis Date  . A-fib ([place])   . ADD (attention deficit disorder)   . Adrenal abnormality ([place])   . Anemia, iron deficiency   . Asthma   .  Diabetes mellitus without complication ([place])   . Fibromyalgia   . Hiatal hernia   . Numbness and tingling   . Obese   . Thyroid disease     Past Surgical History:  Procedure Laterality Date  . ABDOMINAL HYSTERECTOMY    . KNEE SURGERY    . SHOULDER SURGERY      No current facility-administered medications for this encounter.    Current Outpatient Medications  Medication Sig Dispense Refill Last Dose  . acetaminophen (TYLENOL) 500 MG tablet Take 1,000 mg by mouth every 6 (six) hours as needed for mild pain or moderate pain.     Marland Kitchen albuterol (PROVENTIL) (2.5 MG/3ML) 0.083% nebulizer solution USE 1 VIAL VIA NEBULIZER EVERY 4 HOURS AS NEEDED FOR WHEEZE/SHORTNESS OF BREATH (Patient taking differently: Take 2.5 mg by nebulization every 4 (four) hours as needed for wheezing or shortness of breath. ) 75 mL 6   . budesonide-formoterol (SYMBICORT) 160-4.5 MCG/ACT inhaler Inhale 2 puffs into the lungs 2 (two) times daily. 1 Inhaler 0   . gabapentin (NEURONTIN) 300 MG capsule Take 2 in the morning, 1 midday, take 2 in the evening (Patient taking differently: Take 300 mg by mouth See admin instructions. Take 600 mg  in the morning, 300 mg midday, take 600 mg in the evening) 150 capsule 11   . levothyroxine (SYNTHROID) 75 MCG tablet Take 75 mcg by mouth daily before breakfast.     . montelukast (SINGULAIR) 10 MG tablet 1 p.o. nightly (Patient taking differently: Take 10 mg by mouth at bedtime. ) 100 tablet 4   . pantoprazole (PROTONIX) 40 MG tablet Take 30-60 min before first meal of the day (Patient  taking differently: Take 40 mg by mouth daily before breakfast. Take 30-60 min before first meal of the day) 90 tablet 11   . PROAIR HFA 108 (90 Base) MCG/ACT inhaler USE 2 INHALATIONS EVERY 6 HOURS AS NEEDED (Patient taking differently: Inhale 2 puffs into the lungs every 6 (six) hours as needed for shortness of breath. ) 25.5 g 3   . amphetamine-dextroamphetamine (ADDERALL XR) 20 MG 24 hr capsule Take 1  capsule (20 mg total) by mouth 2 (two) times daily. 60 capsule 0   . amphetamine-dextroamphetamine (ADDERALL XR) 20 MG 24 hr capsule Take 1 capsule (20 mg total) by mouth 2 (two) times daily. 60 capsule 0   . amphetamine-dextroamphetamine (ADDERALL XR) 20 MG 24 hr capsule Take 1 capsule (20 mg total) by mouth 2 (two) times daily. 60 capsule 0   . DULoxetine (CYMBALTA) 60 MG capsule Take 1 capsule (60 mg total) by mouth daily. (Patient taking differently: Take 60 mg by mouth daily. ) 90 capsule 1 Not Taking at Unknown time  . famotidine (PEPCID) 20 MG tablet One at bedtime 90 tablet 3 Not Taking at Unknown time  . tolterodine (DETROL LA) 4 MG 24 hr capsule 2 capsules daily 180 capsule 1 Not Taking at Unknown time   Allergies  Allergen Reactions  . Aspirin     REACTION: asthma- on respirator for 28 days  . Codeine Phosphate Hives  . Influenza Vaccines     Soreness, increased asthma, side pain, trouble walking  . Penicillins Hives    REACTION: wheezing and hives  Did it involve swelling of the face/tongue/throat, SOB, or low BP? no Did it involve sudden or severe rash/hives, skin peeling, or any reaction on the inside of your mouth or nose? No Did you need to seek medical attention at a hospital or doctor's office? no When did it last happen?Childhood If all above answers are "NO", may proceed with cephalosporin use.    Social History   Tobacco Use  . Smoking status: Never Smoker  . Smokeless tobacco: Never Used  Substance Use Topics  . Alcohol use: Yes    Comment: occ    Family History  Problem Relation Age of Onset  . Asthma Mother        seasonal  . Colon cancer Mother   . Lung cancer Maternal Uncle   . Esophageal cancer Father        smoker, mets to lungs  . Diabetes Sister      Review of Systems  Constitutional: Positive for diaphoresis, malaise/fatigue and weight loss.  HENT: Negative.   Eyes: Negative.   Respiratory: Negative.   Cardiovascular: Negative.    Gastrointestinal: Negative.   Genitourinary: Negative.   Musculoskeletal: Positive for back pain, joint pain and myalgias.  Skin: Negative.   Neurological: Negative.   Endo/Heme/Allergies: Positive for environmental allergies.  Psychiatric/Behavioral: Negative.     Objective:  Physical Exam  Constitutional: She is oriented to person, place, and time. She appears well-developed and well-nourished. No distress.  HENT:  Head: Normocephalic and atraumatic.  Eyes: Pupils are equal, round, and reactive to light. Conjunctivae and EOM are normal.  Neck: Normal range of motion. Neck supple. No JVD present. No thyromegaly present.  Cardiovascular: Normal rate, regular rhythm, normal heart sounds and intact distal pulses.  Respiratory: Effort normal and breath sounds normal. No stridor.  GI: She exhibits no mass. There is no abdominal tenderness. There is no guarding.  Musculoskeletal:     Comments: On exam of  left knee no skin changes or effusions noted. She has tenderness to palpation over the medial and lateral joint spaces. She is able to fully extend and flex the knee but with some minor pain. No laxity with varus or valgus pressure. Calf is supple. NVI in left lower extremity.   Neurological: She is alert and oriented to person, place, and time.  Skin: Skin is warm and dry.  Psychiatric: She has a normal mood and affect. Her behavior is normal. Judgment and thought content normal.    Vital signs in last 24 hours:    Labs:   Estimated body mass index is 38.23 kg/m as calculated from the following:   Height as of 01/01/19: 5\' 3"  (1.6 m).   Weight as of 01/01/19: 97.9 kg.   Imaging Review Plain radiographs demonstrate severe degenerative joint disease of the left knee(s). The overall alignment ismild valgus. The bone quality appears to be good for age and reported activity level.      Assessment/Plan:  End stage arthritis, left knee   The patient history, physical  examination, clinical judgment of the provider and imaging studies are consistent with end stage degenerative joint disease of the left knee(s) and total knee arthroplasty is deemed medically necessary. The treatment options including medical management, injection therapy arthroscopy and arthroplasty were discussed at length. The risks and benefits of total knee arthroplasty were presented and reviewed. The risks due to aseptic loosening, infection, stiffness, patella tracking problems, thromboembolic complications and other imponderables were discussed. The patient acknowledged the explanation, agreed to proceed with the plan and consent was signed. Patient is being admitted for inpatient treatment for surgery, pain control, PT, OT, prophylactic antibiotics, VTE prophylaxis, progressive ambulation and ADL's and discharge planning. The patient is planning to be discharged home with home health services  Patient will be getting discharged on Xarelto 10 mg for 3 weeks. She is allergic to Codene but Hydrocodone is okay and so is Dilaudid Okay for Clindamycin

## 2019-01-11 NOTE — Progress Notes (Signed)
Pt's pre-op appointment 12-29-18. Surgery scheduled for 01-15-19. CBC and BMP exceeds 14 days. Labs to be repeated the day of surgery, per Konrad Felix, PA. Lab orders placed for day of surgery.

## 2019-01-12 ENCOUNTER — Other Ambulatory Visit (HOSPITAL_COMMUNITY)
Admission: RE | Admit: 2019-01-12 | Discharge: 2019-01-12 | Disposition: A | Discharge status: Home / Self Care | Payer: BC Managed Care – PPO | Source: Ambulatory Visit | Attending: Specialist | Admitting: Specialist

## 2019-01-12 ENCOUNTER — Inpatient Hospital Stay (HOSPITAL_COMMUNITY): Admission: RE | Admit: 2019-01-12 | Payer: BC Managed Care – PPO | Source: Ambulatory Visit

## 2019-01-12 DIAGNOSIS — Z20828 Contact with and (suspected) exposure to other viral communicable diseases: Secondary | ICD-10-CM | POA: Diagnosis not present

## 2019-01-12 DIAGNOSIS — Z01812 Encounter for preprocedural laboratory examination: Secondary | ICD-10-CM | POA: Insufficient documentation

## 2019-01-13 LAB — NOVEL CORONAVIRUS, NAA (HOSP ORDER, SEND-OUT TO REF LAB; TAT 18-24 HRS): SARS-CoV-2, NAA: NOT DETECTED

## 2019-01-15 ENCOUNTER — Inpatient Hospital Stay (HOSPITAL_COMMUNITY): Payer: BC Managed Care – PPO | Admitting: Certified Registered Nurse Anesthetist

## 2019-01-15 ENCOUNTER — Other Ambulatory Visit: Payer: Self-pay

## 2019-01-15 ENCOUNTER — Encounter (HOSPITAL_COMMUNITY): Payer: Self-pay | Admitting: Certified Registered Nurse Anesthetist

## 2019-01-15 ENCOUNTER — Inpatient Hospital Stay (HOSPITAL_COMMUNITY): Payer: BC Managed Care – PPO | Admitting: Physician Assistant

## 2019-01-15 ENCOUNTER — Inpatient Hospital Stay (HOSPITAL_COMMUNITY)
Admission: RE | Admit: 2019-01-15 | Discharge: 2019-01-17 | DRG: 470 | Disposition: A | Discharge status: Home Health | Payer: BC Managed Care – PPO | Attending: Specialist | Admitting: Specialist

## 2019-01-15 ENCOUNTER — Encounter (HOSPITAL_COMMUNITY)
Admission: RE | Disposition: A | Discharge status: Home Health | Payer: Self-pay | Source: Home / Self Care | Attending: Specialist

## 2019-01-15 DIAGNOSIS — Z79890 Hormone replacement therapy (postmenopausal): Secondary | ICD-10-CM

## 2019-01-15 DIAGNOSIS — Z833 Family history of diabetes mellitus: Secondary | ICD-10-CM

## 2019-01-15 DIAGNOSIS — F988 Other specified behavioral and emotional disorders with onset usually occurring in childhood and adolescence: Secondary | ICD-10-CM | POA: Diagnosis present

## 2019-01-15 DIAGNOSIS — Z880 Allergy status to penicillin: Secondary | ICD-10-CM

## 2019-01-15 DIAGNOSIS — Z825 Family history of asthma and other chronic lower respiratory diseases: Secondary | ICD-10-CM | POA: Diagnosis not present

## 2019-01-15 DIAGNOSIS — E669 Obesity, unspecified: Secondary | ICD-10-CM | POA: Diagnosis not present

## 2019-01-15 DIAGNOSIS — M1712 Unilateral primary osteoarthritis, left knee: Secondary | ICD-10-CM | POA: Diagnosis not present

## 2019-01-15 DIAGNOSIS — Z79899 Other long term (current) drug therapy: Secondary | ICD-10-CM | POA: Diagnosis not present

## 2019-01-15 DIAGNOSIS — Z886 Allergy status to analgesic agent status: Secondary | ICD-10-CM | POA: Diagnosis not present

## 2019-01-15 DIAGNOSIS — J4530 Mild persistent asthma, uncomplicated: Secondary | ICD-10-CM | POA: Diagnosis present

## 2019-01-15 DIAGNOSIS — Z885 Allergy status to narcotic agent status: Secondary | ICD-10-CM

## 2019-01-15 DIAGNOSIS — Z887 Allergy status to serum and vaccine status: Secondary | ICD-10-CM | POA: Diagnosis not present

## 2019-01-15 DIAGNOSIS — Z801 Family history of malignant neoplasm of trachea, bronchus and lung: Secondary | ICD-10-CM

## 2019-01-15 DIAGNOSIS — Z90710 Acquired absence of both cervix and uterus: Secondary | ICD-10-CM | POA: Diagnosis not present

## 2019-01-15 DIAGNOSIS — Z800 Family history of malignant neoplasm of digestive organs: Secondary | ICD-10-CM

## 2019-01-15 DIAGNOSIS — Z6838 Body mass index (BMI) 38.0-38.9, adult: Secondary | ICD-10-CM | POA: Diagnosis not present

## 2019-01-15 DIAGNOSIS — I4891 Unspecified atrial fibrillation: Secondary | ICD-10-CM | POA: Diagnosis not present

## 2019-01-15 DIAGNOSIS — D509 Iron deficiency anemia, unspecified: Secondary | ICD-10-CM | POA: Diagnosis not present

## 2019-01-15 DIAGNOSIS — M797 Fibromyalgia: Secondary | ICD-10-CM | POA: Diagnosis present

## 2019-01-15 DIAGNOSIS — Z7951 Long term (current) use of inhaled steroids: Secondary | ICD-10-CM | POA: Diagnosis not present

## 2019-01-15 DIAGNOSIS — K219 Gastro-esophageal reflux disease without esophagitis: Secondary | ICD-10-CM | POA: Diagnosis not present

## 2019-01-15 DIAGNOSIS — G8918 Other acute postprocedural pain: Secondary | ICD-10-CM | POA: Diagnosis not present

## 2019-01-15 DIAGNOSIS — M2390 Unspecified internal derangement of unspecified knee: Secondary | ICD-10-CM | POA: Diagnosis not present

## 2019-01-15 HISTORY — PX: TOTAL KNEE ARTHROPLASTY: SHX125

## 2019-01-15 LAB — CREATININE, SERUM
Creatinine, Ser: 0.74 mg/dL (ref 0.44–1.00)
GFR calc Af Amer: >60 mL/min (ref >60)
GFR calc non Af Amer: >60 mL/min (ref >60)

## 2019-01-15 LAB — CBC
HCT: 41.9 % (ref 36.0–46.0)
Hemoglobin: 12.5 g/dL (ref 12.0–15.0)
MCH: 28 pg (ref 26.0–34.0)
MCHC: 29.8 g/dL — ABNORMAL LOW (ref 30.0–36.0)
MCV: 93.9 fL (ref 80.0–100.0)
Platelets: 264 10*3/uL (ref 150–400)
RBC: 4.46 MIL/uL (ref 3.87–5.11)
RDW: 14.6 % (ref 11.5–15.5)
WBC: 5.7 10*3/uL (ref 4.0–10.5)
nRBC: 0 % (ref 0.0–0.2)

## 2019-01-15 LAB — GLUCOSE, CAPILLARY
Glucose-Capillary: 120 mg/dL — ABNORMAL HIGH (ref 70–99)
Glucose-Capillary: 131 mg/dL — ABNORMAL HIGH (ref 70–99)

## 2019-01-15 LAB — HIV ANTIBODY (ROUTINE TESTING W REFLEX): HIV Screen 4th Generation wRfx: NONREACTIVE

## 2019-01-15 SURGERY — ARTHROPLASTY, KNEE, TOTAL
Anesthesia: Spinal | Site: Knee | Laterality: Left

## 2019-01-15 MED ORDER — OXYCODONE HCL 5 MG PO TABS
ORAL_TABLET | ORAL | Status: AC
  Filled 2019-01-15: qty 1

## 2019-01-15 MED ORDER — CEFAZOLIN SODIUM-DEXTROSE 2-4 GM/100ML-% IV SOLN: 2.0000 g | INTRAVENOUS | Status: DC

## 2019-01-15 MED ORDER — MAGNESIUM CITRATE PO SOLN: 1.0000 | Freq: Once | ORAL | Status: DC | PRN

## 2019-01-15 MED ORDER — ONDANSETRON HCL 4 MG/2ML IJ SOLN: 4.0000 mg | Freq: Four times a day (QID) | INTRAMUSCULAR | Status: DC | PRN

## 2019-01-15 MED ORDER — OXYCODONE HCL 5 MG/5ML PO SOLN: 5.0000 mg | Freq: Once | ORAL | Status: DC | PRN

## 2019-01-15 MED ORDER — STERILE WATER FOR IRRIGATION IR SOLN
Status: DC | PRN
  Administered 2019-01-15: 2000 mL

## 2019-01-15 MED ORDER — CLINDAMYCIN PHOSPHATE 600 MG/50ML IV SOLN
600.0000 mg | Freq: Three times a day (TID) | INTRAVENOUS | Status: DC
  Administered 2019-01-15 – 2019-01-17 (×5): 600 mg via INTRAVENOUS
  Filled 2019-01-15 (×7): qty 50

## 2019-01-15 MED ORDER — PROPOFOL 10 MG/ML IV BOLUS
INTRAVENOUS | Status: AC
  Filled 2019-01-15: qty 20

## 2019-01-15 MED ORDER — 0.9 % SODIUM CHLORIDE (POUR BTL) OPTIME
TOPICAL | Status: DC | PRN
  Administered 2019-01-15: 1000 mL

## 2019-01-15 MED ORDER — POVIDONE-IODINE 10 % EX SWAB
2.0000 "application " | Freq: Once | CUTANEOUS | Status: AC
  Administered 2019-01-15: 2 via TOPICAL

## 2019-01-15 MED ORDER — PHENYLEPHRINE 40 MCG/ML (10ML) SYRINGE FOR IV PUSH (FOR BLOOD PRESSURE SUPPORT)
PREFILLED_SYRINGE | INTRAVENOUS | Status: DC | PRN
  Administered 2019-01-15 (×3): 80 ug via INTRAVENOUS

## 2019-01-15 MED ORDER — PHENYLEPHRINE HCL-NACL 10-0.9 MG/250ML-% IV SOLN
INTRAVENOUS | Status: DC | PRN
  Administered 2019-01-15: 35 ug/min via INTRAVENOUS

## 2019-01-15 MED ORDER — CHLORHEXIDINE GLUCONATE 4 % EX LIQD: 60.0000 mL | Freq: Once | CUTANEOUS | Status: DC

## 2019-01-15 MED ORDER — ONDANSETRON HCL 4 MG/2ML IJ SOLN
INTRAMUSCULAR | Status: AC
  Filled 2019-01-15: qty 2

## 2019-01-15 MED ORDER — OXYCODONE HCL ER 10 MG PO T12A
10.0000 mg | EXTENDED_RELEASE_TABLET | Freq: Two times a day (BID) | ORAL | Status: DC
  Administered 2019-01-15 – 2019-01-16 (×2): 10 mg via ORAL
  Filled 2019-01-15 (×2): qty 1

## 2019-01-15 MED ORDER — CLINDAMYCIN PHOSPHATE 900 MG/50ML IV SOLN
900.0000 mg | INTRAVENOUS | Status: AC
  Administered 2019-01-15: 900 mg via INTRAVENOUS
  Filled 2019-01-15: qty 50

## 2019-01-15 MED ORDER — DEXAMETHASONE SODIUM PHOSPHATE 10 MG/ML IJ SOLN
INTRAMUSCULAR | Status: DC | PRN
  Administered 2019-01-15: 4 mg via INTRAVENOUS

## 2019-01-15 MED ORDER — SODIUM CHLORIDE 0.9 % IV SOLN
INTRAVENOUS | Status: DC
  Administered 2019-01-15: 17:00:00 via INTRAVENOUS

## 2019-01-15 MED ORDER — SODIUM CHLORIDE (PF) 0.9 % IJ SOLN
INTRAMUSCULAR | Status: AC
  Filled 2019-01-15: qty 50

## 2019-01-15 MED ORDER — PHENYLEPHRINE 40 MCG/ML (10ML) SYRINGE FOR IV PUSH (FOR BLOOD PRESSURE SUPPORT)
PREFILLED_SYRINGE | INTRAVENOUS | Status: AC
  Filled 2019-01-15: qty 10

## 2019-01-15 MED ORDER — POVIDONE-IODINE 10 % EX SWAB: 2.0000 "application " | Freq: Once | CUTANEOUS | Status: DC

## 2019-01-15 MED ORDER — ACETAMINOPHEN 325 MG PO TABS
325.0000 mg | ORAL_TABLET | Freq: Four times a day (QID) | ORAL | Status: DC | PRN
  Administered 2019-01-16 – 2019-01-17 (×2): 650 mg via ORAL
  Filled 2019-01-15 (×2): qty 2

## 2019-01-15 MED ORDER — BUPIVACAINE LIPOSOME 1.3 % IJ SUSP
INTRAMUSCULAR | Status: DC | PRN
  Administered 2019-01-15: 20 mL

## 2019-01-15 MED ORDER — METHOCARBAMOL 500 MG PO TABS: 500.0000 mg | ORAL_TABLET | Freq: Four times a day (QID) | ORAL | 0 refills | Status: DC | PRN

## 2019-01-15 MED ORDER — DEXAMETHASONE SODIUM PHOSPHATE 10 MG/ML IJ SOLN
INTRAMUSCULAR | Status: AC
  Filled 2019-01-15: qty 1

## 2019-01-15 MED ORDER — ENOXAPARIN SODIUM 40 MG/0.4ML ~~LOC~~ SOLN: 40.0000 mg | SUBCUTANEOUS | 0 refills | Status: DC

## 2019-01-15 MED ORDER — PROPOFOL 500 MG/50ML IV EMUL
INTRAVENOUS | Status: DC | PRN
  Administered 2019-01-15: 100 ug/kg/min via INTRAVENOUS

## 2019-01-15 MED ORDER — MIDAZOLAM HCL 2 MG/2ML IJ SOLN
INTRAMUSCULAR | Status: AC
  Administered 2019-01-15: 10:00:00
  Filled 2019-01-15: qty 2

## 2019-01-15 MED ORDER — TRAMADOL HCL 50 MG PO TABS
50.0000 mg | ORAL_TABLET | Freq: Four times a day (QID) | ORAL | Status: DC
  Administered 2019-01-15 – 2019-01-17 (×7): 50 mg via ORAL
  Filled 2019-01-15 (×8): qty 1

## 2019-01-15 MED ORDER — DIPHENHYDRAMINE HCL 12.5 MG/5ML PO ELIX
12.5000 mg | ORAL_SOLUTION | ORAL | Status: DC | PRN
  Filled 2019-01-15: qty 10

## 2019-01-15 MED ORDER — OXYCODONE HCL 5 MG PO TABS
10.0000 mg | ORAL_TABLET | ORAL | Status: DC | PRN
  Administered 2019-01-15 – 2019-01-16 (×2): 10 mg via ORAL
  Administered 2019-01-16: 15 mg via ORAL
  Administered 2019-01-16: 10 mg via ORAL
  Administered 2019-01-17: 15 mg via ORAL
  Administered 2019-01-17: 03:00:00 10 mg via ORAL
  Filled 2019-01-15: qty 3
  Filled 2019-01-15: qty 2
  Filled 2019-01-15: qty 3
  Filled 2019-01-15: qty 2

## 2019-01-15 MED ORDER — FENTANYL CITRATE (PF) 100 MCG/2ML IJ SOLN: 25.0000 ug | INTRAMUSCULAR | Status: DC | PRN

## 2019-01-15 MED ORDER — PROPOFOL 10 MG/ML IV BOLUS
INTRAVENOUS | Status: DC | PRN
  Administered 2019-01-15: 20 mg via INTRAVENOUS
  Administered 2019-01-15: 10 mg via INTRAVENOUS
  Administered 2019-01-15 (×3): 20 mg via INTRAVENOUS

## 2019-01-15 MED ORDER — ONDANSETRON HCL 4 MG/2ML IJ SOLN
INTRAMUSCULAR | Status: DC | PRN
  Administered 2019-01-15: 4 mg via INTRAVENOUS

## 2019-01-15 MED ORDER — LACTATED RINGERS IV SOLN
INTRAVENOUS | Status: DC
  Administered 2019-01-15 (×2): via INTRAVENOUS

## 2019-01-15 MED ORDER — SODIUM CHLORIDE 0.9 % IR SOLN
Status: DC | PRN
  Administered 2019-01-15: 1000 mL

## 2019-01-15 MED ORDER — FENTANYL CITRATE (PF) 100 MCG/2ML IJ SOLN
INTRAMUSCULAR | Status: AC
  Administered 2019-01-15: 50 ug
  Filled 2019-01-15: qty 2

## 2019-01-15 MED ORDER — OXYCODONE HCL 5 MG PO TABS: 5.0000 mg | ORAL_TABLET | Freq: Three times a day (TID) | ORAL | 0 refills | Status: DC | PRN

## 2019-01-15 MED ORDER — ROPIVACAINE HCL 7.5 MG/ML IJ SOLN
INTRAMUSCULAR | Status: DC | PRN
  Administered 2019-01-15: 20 mL via PERINEURAL

## 2019-01-15 MED ORDER — TRANEXAMIC ACID-NACL 1000-0.7 MG/100ML-% IV SOLN: 1000.0000 mg | INTRAVENOUS | Status: DC

## 2019-01-15 MED ORDER — ACETAMINOPHEN 500 MG PO TABS
1000.0000 mg | ORAL_TABLET | Freq: Four times a day (QID) | ORAL | Status: AC
  Administered 2019-01-15 – 2019-01-16 (×3): 1000 mg via ORAL
  Filled 2019-01-15 (×3): qty 2

## 2019-01-15 MED ORDER — TRANEXAMIC ACID-NACL 1000-0.7 MG/100ML-% IV SOLN
1000.0000 mg | INTRAVENOUS | Status: AC
  Administered 2019-01-15: 1000 mg via INTRAVENOUS
  Filled 2019-01-15: qty 100

## 2019-01-15 MED ORDER — ONDANSETRON HCL 4 MG PO TABS: 4.0000 mg | ORAL_TABLET | Freq: Every day | ORAL | 1 refills | Status: DC | PRN

## 2019-01-15 MED ORDER — SODIUM CHLORIDE (PF) 0.9 % IJ SOLN
INTRAMUSCULAR | Status: AC
  Filled 2019-01-15: qty 10

## 2019-01-15 MED ORDER — METHOCARBAMOL 500 MG IVPB - SIMPLE MED
INTRAVENOUS | Status: AC
  Filled 2019-01-15: qty 50

## 2019-01-15 MED ORDER — OXYCODONE HCL 5 MG PO TABS: 5.0000 mg | ORAL_TABLET | Freq: Once | ORAL | Status: DC | PRN

## 2019-01-15 MED ORDER — ENOXAPARIN SODIUM 40 MG/0.4ML ~~LOC~~ SOLN
40.0000 mg | SUBCUTANEOUS | Status: DC
  Administered 2019-01-16 – 2019-01-17 (×2): 40 mg via SUBCUTANEOUS
  Filled 2019-01-15 (×2): qty 0.4

## 2019-01-15 MED ORDER — BUPIVACAINE LIPOSOME 1.3 % IJ SUSP
20.0000 mL | Freq: Once | INTRAMUSCULAR | Status: AC
  Administered 2019-01-15: 20 mL
  Filled 2019-01-15: qty 20

## 2019-01-15 MED ORDER — METHOCARBAMOL 500 MG IVPB - SIMPLE MED
500.0000 mg | Freq: Four times a day (QID) | INTRAVENOUS | Status: DC | PRN
  Administered 2019-01-15: 500 mg via INTRAVENOUS
  Filled 2019-01-15: qty 50

## 2019-01-15 MED ORDER — SODIUM CHLORIDE 0.9 % IV SOLN: 20.0000 mL | Freq: Once | INTRAVENOUS | Status: DC

## 2019-01-15 MED ORDER — BISACODYL 5 MG PO TBEC
5.0000 mg | DELAYED_RELEASE_TABLET | Freq: Every day | ORAL | Status: DC | PRN
  Filled 2019-01-15: qty 1

## 2019-01-15 MED ORDER — GABAPENTIN 300 MG PO CAPS
300.0000 mg | ORAL_CAPSULE | Freq: Three times a day (TID) | ORAL | Status: DC
  Administered 2019-01-15 – 2019-01-17 (×6): 300 mg via ORAL
  Filled 2019-01-15 (×6): qty 1

## 2019-01-15 MED ORDER — METHOCARBAMOL 500 MG PO TABS
500.0000 mg | ORAL_TABLET | Freq: Four times a day (QID) | ORAL | Status: DC | PRN
  Administered 2019-01-16 – 2019-01-17 (×4): 500 mg via ORAL
  Filled 2019-01-15 (×4): qty 1

## 2019-01-15 MED ORDER — SODIUM CHLORIDE (PF) 0.9 % IJ SOLN
INTRAMUSCULAR | Status: DC | PRN
  Administered 2019-01-15: 60 mL

## 2019-01-15 MED ORDER — HYDROMORPHONE HCL 1 MG/ML IJ SOLN
0.5000 mg | INTRAMUSCULAR | Status: DC | PRN
  Administered 2019-01-15 – 2019-01-16 (×2): 1 mg via INTRAVENOUS
  Administered 2019-01-16: 0.5 mg via INTRAVENOUS
  Filled 2019-01-15 (×3): qty 1

## 2019-01-15 MED ORDER — OXYCODONE HCL 5 MG PO TABS
5.0000 mg | ORAL_TABLET | ORAL | Status: DC | PRN
  Administered 2019-01-16: 5 mg via ORAL
  Filled 2019-01-15: qty 2
  Filled 2019-01-15: qty 1

## 2019-01-15 MED ORDER — BUPIVACAINE IN DEXTROSE 0.75-8.25 % IT SOLN
INTRATHECAL | Status: DC | PRN
  Administered 2019-01-15: 1.6 mL via INTRATHECAL

## 2019-01-15 MED ORDER — DEXMEDETOMIDINE HCL IN NACL 200 MCG/50ML IV SOLN
INTRAVENOUS | Status: DC | PRN
  Administered 2019-01-15: 4 ug via INTRAVENOUS
  Administered 2019-01-15: 8 ug via INTRAVENOUS
  Administered 2019-01-15 (×2): 4 ug via INTRAVENOUS

## 2019-01-15 MED ORDER — SENNOSIDES-DOCUSATE SODIUM 8.6-50 MG PO TABS
1.0000 | ORAL_TABLET | Freq: Every evening | ORAL | Status: DC | PRN
  Administered 2019-01-16: 1 via ORAL
  Filled 2019-01-15 (×2): qty 1

## 2019-01-15 SURGICAL SUPPLY — 69 items
ADH SKN CLS APL DERMABOND .7 (GAUZE/BANDAGES/DRESSINGS) ×1
ATTUNE MED DOME PAT 32 KNEE (Knees) ×1 IMPLANT
ATTUNE PSFEM LTSZ4 NARCEM KNEE (Femur) ×1 IMPLANT
ATTUNE PSRP INSR SZ4 7 KNEE (Insert) ×1 IMPLANT
BAG DECANTER FOR FLEXI CONT (MISCELLANEOUS) IMPLANT
BAG SPEC THK2 15X12 ZIP CLS (MISCELLANEOUS) ×2
BAG ZIPLOCK 12X15 (MISCELLANEOUS) ×4 IMPLANT
BASE TIBIAL ROT PLAT SZ 3 KNEE (Knees) IMPLANT
BLADE SAG 18X100X1.27 (BLADE) ×2 IMPLANT
BLADE SAW SGTL 11.0X1.19X90.0M (BLADE) ×2 IMPLANT
BNDG ELASTIC 4X5.8 VLCR STR LF (GAUZE/BANDAGES/DRESSINGS) ×2 IMPLANT
BNDG ELASTIC 6X5.8 VLCR STR LF (GAUZE/BANDAGES/DRESSINGS) ×3 IMPLANT
BONE CEMENT GENTAMICIN (Cement) ×4 IMPLANT
BOWL SMART MIX CTS (DISPOSABLE) ×2 IMPLANT
BSPLAT TIB 3 CMNT ROT PLAT STR (Knees) ×1 IMPLANT
CEMENT BONE GENTAMICIN 40 (Cement) IMPLANT
COVER SURGICAL LIGHT HANDLE (MISCELLANEOUS) ×2 IMPLANT
COVER WAND RF STERILE (DRAPES) ×1 IMPLANT
CUFF TOURN SGL QUICK 34 (TOURNIQUET CUFF) ×2
CUFF TRNQT CYL 34X4.125X (TOURNIQUET CUFF) ×1 IMPLANT
DECANTER SPIKE VIAL GLASS SM (MISCELLANEOUS) ×2 IMPLANT
DERMABOND ADVANCED (GAUZE/BANDAGES/DRESSINGS) ×1
DERMABOND ADVANCED .7 DNX12 (GAUZE/BANDAGES/DRESSINGS) ×1 IMPLANT
DRAPE U-SHAPE 47X51 STRL (DRAPES) ×2 IMPLANT
DRESSING AQUACEL AG SP 3.5X10 (GAUZE/BANDAGES/DRESSINGS) IMPLANT
DRSG AQUACEL AG ADV 3.5X10 (GAUZE/BANDAGES/DRESSINGS) ×2 IMPLANT
DRSG AQUACEL AG SP 3.5X10 (GAUZE/BANDAGES/DRESSINGS) ×2
DRSG TEGADERM 4X4.75 (GAUZE/BANDAGES/DRESSINGS) ×2 IMPLANT
DURAPREP 26ML APPLICATOR (WOUND CARE) ×5 IMPLANT
ELECT REM PT RETURN 15FT ADLT (MISCELLANEOUS) ×2 IMPLANT
EVACUATOR 1/8 PVC DRAIN (DRAIN) ×2 IMPLANT
GAUZE SPONGE 2X2 8PLY STRL LF (GAUZE/BANDAGES/DRESSINGS) ×1 IMPLANT
GLOVE BIOGEL PI IND STRL 7.5 (GLOVE) ×1 IMPLANT
GLOVE BIOGEL PI IND STRL 8 (GLOVE) ×1 IMPLANT
GLOVE BIOGEL PI INDICATOR 7.5 (GLOVE) ×1
GLOVE BIOGEL PI INDICATOR 8 (GLOVE) ×1
GLOVE ECLIPSE 8.0 STRL XLNG CF (GLOVE) ×2 IMPLANT
GLOVE SURG ORTHO 9.0 STRL STRW (GLOVE) ×2 IMPLANT
GLOVE SURG SS PI 7.0 STRL IVOR (GLOVE) ×2 IMPLANT
GOWN STRL REUS W/TWL XL LVL3 (GOWN DISPOSABLE) ×4 IMPLANT
HANDPIECE INTERPULSE COAX TIP (DISPOSABLE) ×2
HOLDER FOLEY CATH W/STRAP (MISCELLANEOUS) ×1 IMPLANT
KIT TURNOVER KIT A (KITS) IMPLANT
NS IRRIG 1000ML POUR BTL (IV SOLUTION) ×2 IMPLANT
PACK TOTAL KNEE CUSTOM (KITS) ×2 IMPLANT
PIN DRILL FIX HALF THREAD (BIT) ×1 IMPLANT
PIN STEINMAN FIXATION KNEE (PIN) ×1 IMPLANT
PROTECTOR NERVE ULNAR (MISCELLANEOUS) ×2 IMPLANT
SET HNDPC FAN SPRY TIP SCT (DISPOSABLE) ×1 IMPLANT
SET PAD KNEE POSITIONER (MISCELLANEOUS) ×2 IMPLANT
SPONGE GAUZE 2X2 STER 10/PKG (GAUZE/BANDAGES/DRESSINGS) ×1
SPONGE LAP 18X18 RF (DISPOSABLE) IMPLANT
SPONGE SURGIFOAM ABS GEL 100 (HEMOSTASIS) ×2 IMPLANT
STOCKINETTE 6  STRL (DRAPES) ×1
STOCKINETTE 6 STRL (DRAPES) ×1 IMPLANT
SUT BONE WAX W31G (SUTURE) IMPLANT
SUT MNCRL AB 3-0 PS2 18 (SUTURE) ×2 IMPLANT
SUT VIC AB 1 CT1 27 (SUTURE) ×6
SUT VIC AB 1 CT1 27XBRD ANTBC (SUTURE) ×3 IMPLANT
SUT VIC AB 2-0 CT1 27 (SUTURE) ×4
SUT VIC AB 2-0 CT1 TAPERPNT 27 (SUTURE) ×2 IMPLANT
SUT VLOC 180 0 24IN GS25 (SUTURE) ×2 IMPLANT
SYR 50ML LL SCALE MARK (SYRINGE) IMPLANT
TAPE STRIPS DRAPE STRL (GAUZE/BANDAGES/DRESSINGS) ×2 IMPLANT
TIBIAL BASE ROT PLAT SZ 3 KNEE (Knees) ×2 IMPLANT
TRAY FOLEY MTR SLVR 14FR STAT (SET/KITS/TRAYS/PACK) ×1 IMPLANT
WATER STERILE IRR 1000ML POUR (IV SOLUTION) ×4 IMPLANT
WRAP KNEE MAXI GEL POST OP (GAUZE/BANDAGES/DRESSINGS) ×2 IMPLANT
YANKAUER SUCT BULB TIP 10FT TU (MISCELLANEOUS) ×2 IMPLANT

## 2019-01-15 NOTE — Anesthesia Postprocedure Evaluation (Signed)
Anesthesia Post Note  Patient: Carrie [name]  Procedure(s) Performed: TOTAL KNEE ARTHROPLASTY (Left Knee)     Patient location during evaluation: PACU Anesthesia Type: Spinal Level of consciousness: awake and alert Pain management: pain level controlled Vital Signs Assessment: post-procedure vital signs reviewed and stable Respiratory status: spontaneous breathing and respiratory function stable Cardiovascular status: blood pressure returned to baseline and stable Postop Assessment: spinal receding Anesthetic complications: no    Last Vitals:  Vitals:   01/15/19 1430 01/15/19 1530  BP: 129/79 (!) 143/88  Pulse: 77 91  Resp: 10 12  Temp: 36.4 C 36.8 C  SpO2: 99% 96%    Last Pain:  Vitals:   01/15/19 1515  TempSrc:   PainSc: 9                  Tiajuana Amass

## 2019-01-15 NOTE — Anesthesia Procedure Notes (Signed)
Spinal  Patient location during procedure: OR Start time: 01/15/2019 11:11 AM End time: 01/15/2019 11:16 AM Staffing Anesthesiologist: Albertha Ghee, MD Performed: anesthesiologist  Preanesthetic Checklist Completed: patient identified, surgical consent, pre-op evaluation, timeout performed, IV checked, risks and benefits discussed and monitors and equipment checked Spinal Block Patient position: sitting Prep: DuraPrep Patient monitoring: cardiac monitor, continuous pulse ox and blood pressure Approach: midline Location: L3-4 Injection technique: single-shot Needle Needle type: Pencan  Needle gauge: 24 G Needle length: 9 cm Assessment Sensory level: T10 Additional Notes Functioning IV was confirmed and monitors were applied. Sterile prep and drape, including hand hygiene and sterile gloves were used. The patient was positioned and the spine was prepped. The skin was anesthetized with lidocaine.  Free flow of clear CSF was obtained prior to injecting local anesthetic into the CSF.  The spinal needle aspirated freely following injection.  The needle was carefully withdrawn.  The patient tolerated the procedure well.

## 2019-01-15 NOTE — Progress Notes (Signed)
AssistedDr. Marcie Bal with left, ultrasound guided, adductor canal block. Side rails up, monitors on throughout procedure. See vital signs in flow sheet. Tolerated Procedure well.

## 2019-01-15 NOTE — Anesthesia Procedure Notes (Signed)
Anesthesia Regional Block: Adductor canal block   Pre-Anesthetic Checklist: ,, timeout performed, Correct Patient, Correct Site, Correct Laterality, Correct Procedure, Correct Position, site marked, Risks and benefits discussed,  Surgical consent,  Pre-op evaluation,  At surgeon's request and post-op pain management  Laterality: Left  Prep: chloraprep       Needles:  Injection technique: Single-shot  Needle Type: Echogenic Needle     Needle Length: 9cm  Needle Gauge: 21     Additional Needles:   Narrative:  Start time: 01/15/2019 9:48 AM End time: 01/15/2019 9:54 AM Injection made incrementally with aspirations every 5 mL.  Performed by: Personally  Anesthesiologist: Albertha Ghee, MD  Additional Notes: Pt tolerated the procedure well.

## 2019-01-15 NOTE — Interval H&P Note (Signed)
History and Physical Interval Note:  01/15/2019 10:15 AM  Francoise Schaumann  has presented today for surgery, with the diagnosis of Left knee osteoarthritis.  The various methods of treatment have been discussed with the patient and family. After consideration of risks, benefits and other options for treatment, the patient has consented to  Procedure(s): TOTAL KNEE ARTHROPLASTY (Left) as a surgical intervention.  The patient's history has been reviewed, patient examined, no change in status, stable for surgery.  I have reviewed the patient's chart and labs.  Questions were answered to the patient's satisfaction.     Angelyn Osterberg ANDREW

## 2019-01-15 NOTE — Op Note (Signed)
DATE OF SURGERY:  01/15/2019  TIME: 1:29 PM  PATIENT NAME:  Carrie [name]    AGE: 58 y.o.   PRE-OPERATIVE DIAGNOSIS:  Left knee osteoarthritis  POST-OPERATIVE DIAGNOSIS:  Left knee osteoarthritis  PROCEDURE:  Procedure(s): TOTAL KNEE ARTHROPLASTY  SURGEON:  Adilen Pavelko ANDREW  ASSISTANT:  Leeanne Haus, PA-C, present and scrubbed throughout the case, critical for assistance with exposure, retraction, instrumentation, and closure.  OPERATIVE IMPLANTS: Depuy PFC Attune Rotating Platform.  Femur size 4, Tibia size 3, Patella size 32 3-peg oval button, with a 7 mm polyethylene insert.   PREOPERATIVE INDICATIONS:   Carrie [name] is a 58 y.o. year old female with end stage bone on bone arthritis of the knee who failed conservative treatment and elected for Total Knee Arthroplasty.   The risks, benefits, and alternatives were discussed at length including but not limited to the risks of infection, bleeding, nerve injury, stiffness, blood clots, the need for revision surgery, cardiopulmonary complications, among others, and they were willing to proceed.  OPERATIVE DESCRIPTION:  The patient was brought to the operative room and placed in a supine position.  Spinal anesthesia was administered.  IV antibiotics were given.  The lower extremity was prepped and draped in the usual sterile fashion.  Time out was performed.  The leg was elevated and exsanguinated and the tourniquet was inflated.  Anterior quadriceps tendon splitting approach was performed.  The patella was retracted and osteophytes were removed.  The anterior horn of the medial and lateral meniscus was removed and cruciate ligaments resected.   The distal femur was opened with the drill and the intramedullary distal femoral cutting jig was utilized, set at 5 degrees resecting 10 mm off the distal femur.  Care was taken to protect the collateral ligaments.  The distal femoral sizing jig was applied, taking care to avoid  notching.  Then the 4-in-1 cutting jig was applied and the anterior and posterior femur was cut, along with the chamfer cuts.    Then the extramedullary tibial cutting jig was utilized making the appropriate cut using the anterior tibial crest as a reference building in appropriate posterior slope.  Care was taken during the cut to protect the medial and collateral ligaments.  The proximal tibia was removed along with the posterior horns of the menisci.   The posterior medial femoral osteophytes and posterior lateral femoral osteophytes were removed.    The flexion gap was then measured and was symmetric with the extension gap, measured at 7.  I completed the distal femoral preparation using the appropriate jig to prepare the box.  The patella was then measured, and cut with the saw.    The proximal tibia sized and prepared accordingly with the reamer and the punch, and then all components were trialed with the trial insert.  The knee was found to have excellent balance and full motion.    The above named components were then cemented into place and all excess cement was removed.  The trial polyethylene component was in place during cementation, and then was exchanged for the real polyethylene component.    The knee was easily taken through a range of motion and the patella tracked well and the knee irrigated copiously and the parapatellar and subcutaneous tissue closed with vicryl, and monocryl with steri strips for the skin.  The arthrotomy was closed at 90 of flexion. The wounds were dressed with sterile gauze and the tourniquet released and the patient was awakened and returned to the PACU in  stable and satisfactory condition.  There were no complications.  Total tourniquet time was 85 minutes.

## 2019-01-15 NOTE — Transfer of Care (Signed)
Immediate Anesthesia Transfer of Care Note  Patient: SOLIMAR MAIDEN  Procedure(s) Performed: TOTAL KNEE ARTHROPLASTY (Left Knee)  Patient Location: PACU  Anesthesia Type:Spinal and MAC combined with regional for post-op pain  Level of Consciousness: awake, alert  and oriented  Airway & Oxygen Therapy: Patient Spontanous Breathing and Patient connected to face mask oxygen  Post-op Assessment: Report given to RN and Post -op Vital signs reviewed and stable  Post vital signs: Reviewed and stable  Last Vitals:  Vitals Value Taken Time  BP 122/78   Temp    Pulse 79 01/15/19 1347  Resp 12 01/15/19 1347  SpO2 100 % 01/15/19 1347  Vitals shown include unvalidated device data.  Last Pain:  Vitals:   01/15/19 0846  TempSrc:   PainSc: 0-No pain      Patients Stated Pain Goal: 3 (57/[id number])  Complications: No apparent anesthesia complications

## 2019-01-15 NOTE — Anesthesia Preprocedure Evaluation (Signed)
Anesthesia Evaluation  Patient identified by MRN, date of birth, ID band Patient awake    Reviewed: Allergy & Precautions, H&P , NPO status , Patient's Chart, lab work & pertinent test results  Airway Mallampati: II   Neck ROM: full    Dental   Pulmonary asthma ,    breath sounds clear to auscultation       Cardiovascular + dysrhythmias Atrial Fibrillation  Rhythm:regular Rate:Normal  EKG: NSR   Neuro/Psych PSYCHIATRIC DISORDERS  Neuromuscular disease    GI/Hepatic hiatal hernia, GERD  ,  Endo/Other  diabetes, Type 2obese  Renal/GU      Musculoskeletal  (+) Fibromyalgia -  Abdominal   Peds  Hematology   Anesthesia Other Findings   Reproductive/Obstetrics                             Anesthesia Physical Anesthesia Plan  ASA: II  Anesthesia Plan: Spinal   Post-op Pain Management:  Regional for Post-op pain   Induction: Intravenous  PONV Risk Score and Plan: 2 and Ondansetron, Dexamethasone, Midazolam, Treatment may vary due to age or medical condition and Propofol infusion  Airway Management Planned: Simple Face Mask  Additional Equipment:   Intra-op Plan:   Post-operative Plan:   Informed Consent: I have reviewed the patients History and Physical, chart, labs and discussed the procedure including the risks, benefits and alternatives for the proposed anesthesia with the patient or authorized representative who has indicated his/her understanding and acceptance.       Plan Discussed with: CRNA, Anesthesiologist and Surgeon  Anesthesia Plan Comments:         Anesthesia Quick Evaluation

## 2019-01-15 NOTE — Evaluation (Signed)
Physical Therapy Evaluation Patient Details Name: Carrie [name] MRN: [id number] DOB: 08/02/1960 Today's Date: 01/15/2019   History of Present Illness  Patient is 58 y.o. female s/p Lt TKA on 01/15/19 with PMH significant for fibromyalgia, DM, A-fib, asthma, and thyroid disease.    Clinical Impression  Carrie [name] is a 58 y.o. female POD 0 s/p Lt TKA. Patient reports independence with mobility at baseline. Patient is now limited by functional impairments (see PT problem list below) and requires min assist for transfers with RW. Manual facilitation of Lt knee extension required during stand step transfer to prevent knee buckling, gait deffered today due to patient's pain and onset notable diaphoresis. Pt denied symptoms of lightheadedness, dizziness, or nausea and stated she just felt sweaty. Patient will benefit from continued skilled PT interventions to address impairments and progress towards PLOF. Acute PT will follow to progress mobility and stair training in preparation for safe discharge home.     Follow Up Recommendations Follow surgeon's recommendation for DC plan and follow-up therapies    Equipment Recommendations  None recommended by PT    Recommendations for Other Services       Precautions / Restrictions Precautions Precautions: Fall Restrictions Weight Bearing Restrictions: No      Mobility  Bed Mobility Overal bed mobility: Needs Assistance Bed Mobility: Supine to Sit     Supine to sit: Min assist;HOB elevated     General bed mobility comments: assist required for LE mobility, pt able to use bed rails to move to EOB  Transfers Overall transfer level: Needs assistance Equipment used: Rolling walker (2 wheeled) Transfers: Sit to/from [facility] Sit to Stand: Min assist;From elevated surface Stand pivot transfers: Min assist       General transfer comment: cues for safe hand placement and technique with RW; manual facilitation at Lt  knee required to maintain extension in Lt stance phase during stand step transfer, cues and assist required for RW managment with transfers; pt slightly diaphoretic and mobility limited to transfer  Ambulation/Gait                Stairs            Wheelchair Mobility    Modified Rankin (Stroke Patients Only)       Balance Overall balance assessment: Needs assistance Sitting-balance support: Feet supported;Single extremity supported Sitting balance-Leahy Scale: Fair     Standing balance support: During functional activity;Bilateral upper extremity supported Standing balance-Leahy Scale: Poor              Pertinent Vitals/Pain Pain Assessment: Faces Faces Pain Scale: Hurts little more Pain Location: Lt knee Pain Descriptors / Indicators: Guarding;Grimacing;Sore Pain Intervention(s): Limited activity within patient's tolerance;Monitored during session;Ice applied;Repositioned    Home Living Family/patient expects to be discharged to:: Private residence Living Arrangements: Spouse/significant other;Children Available Help at Discharge: Family Type of Home: House Home Access: Stairs to enter Entrance Stairs-Rails: Psychiatric nurse of Steps: 3 at garage no rail, 6 at front with 2 rails (wide cannot reach both) Home Layout: Two level;Able to live on main level with bedroom/bathroom;Bed/bath upstairs;Full bath on main level Home Equipment: Shower seat - built in;Walker - 2 wheels Additional Comments: pt has a full bath with a walk in shower on main floor and second floor; she sleep upstairs but can stay on main floor if needed    Prior Function Level of Independence: Independent               Hand  Dominance   Dominant Hand: Right    Extremity/Trunk Assessment   Upper Extremity Assessment Upper Extremity Assessment: Overall WFL for tasks assessed    Lower Extremity Assessment Lower Extremity Assessment: Overall WFL for tasks  assessed;LLE deficits/detail LLE Deficits / Details: pt with limited quad activation, 3+/5 for quad strength with MMT; manual facilitation at knee for extension required in standing to prevent buckling LLE Sensation: WNL LLE Coordination: WNL    Cervical / Trunk Assessment Cervical / Trunk Assessment: Normal  Communication   Communication: No difficulties  Cognition Arousal/Alertness: Awake/alert Behavior During Therapy: WFL for tasks assessed/performed Overall Cognitive Status: Within Functional Limits for tasks assessed                Assessment/Plan    PT Assessment Patient needs continued PT services  PT Problem List Decreased strength;Decreased balance;Decreased mobility;Decreased range of motion;Decreased activity tolerance;Decreased knowledge of use of DME       PT Treatment Interventions DME instruction;Functional mobility training;Balance training;Patient/family education;Modalities;Therapeutic activities;Gait training;Stair training;Therapeutic exercise    PT Goals (Current goals can be found in the Care Plan section)  Acute Rehab PT Goals Patient Stated Goal: to return hom and get upstairs PT Goal Formulation: With patient Time For Goal Achievement: 01/22/19 Potential to Achieve Goals: Good    Frequency 7X/week    AM-PAC PT "6 Clicks" Mobility  Outcome Measure Help needed turning from your back to your side while in a flat bed without using bedrails?: A Little Help needed moving from lying on your back to sitting on the side of a flat bed without using bedrails?: A Little Help needed moving to and from a bed to a chair (including a wheelchair)?: A Little Help needed standing up from a chair using your arms (e.g., wheelchair or bedside chair)?: A Little Help needed to walk in hospital room?: A Lot Help needed climbing 3-5 steps with a railing? : A Lot 6 Click Score: 16    End of Session Equipment Utilized During Treatment: Gait belt Activity Tolerance:  Patient tolerated treatment well Patient left: in chair;with call bell/phone within reach;with family/visitor present;with chair alarm set Nurse Communication: Mobility status PT Visit Diagnosis: Muscle weakness (generalized) (M62.81);Difficulty in walking, not elsewhere classified (R26.2)    Time: [id number] PT Time Calculation (min) (ACUTE ONLY): 27 min   Charges:   PT Evaluation $PT Eval Low Complexity: 1 Low         Kipp Brood, PT, DPT Physical Therapist with [facility]  01/15/2019 7:25 PM

## 2019-01-16 NOTE — Progress Notes (Signed)
Subjective: 1 Day Post-Op Procedure(s) (LRB): TOTAL KNEE ARTHROPLASTY (Left) Patient reports pain as mild.   No events over night Tolerating PO w/o N/V Due to void -flatus, -BM Denies CP, calf pain, SOB, dizziness, sweats/chills.  Objective: Vital signs in last 24 hours: Temp:  [97.6 F (36.4 C)-98.3 F (36.8 C)] 97.9 F (36.6 C) (10/31 0537) Pulse Rate:  [70-91] 70 (10/31 0537) Resp:  [8-24] 16 (10/31 0537) BP: (106-145)/(64-115) 128/92 (10/31 0537) SpO2:  [94 %-100 %] 100 % (10/31 0537)  Intake/Output from previous day: 10/30 0701 - 10/31 0700 In: 2085.2 [P.O.:420; I.V.:1565.2; IV Piggyback:100] Out: 1760 [Urine:1400; Drains:260; Blood:100] Intake/Output this shift: Total I/O In: 600 [P.O.:600] Out: -   Recent Labs    01/15/19 1355  HGB 12.5   Recent Labs    01/15/19 1355  WBC 5.7  RBC 4.46  HCT 41.9  PLT 264   Recent Labs    01/15/19 1355  CREATININE 0.74   No results for input(s): LABPT, INR in the last 72 hours.  Neurologically intact ABD soft Neurovascular intact Sensation intact distally Intact pulses distally Dorsiflexion/Plantar flexion intact Incision: dressing C/D/I No cellulitis present Compartment soft Drain with serosanguinous fluid was pulled without issue.   Assessment/Plan: 1 Day Post-Op Procedure(s) (LRB): TOTAL KNEE ARTHROPLASTY (Left) Advance diet Up with therapy  Plan to D/C home pending void and working with PT    Yvonne Kendall Ward 01/16/2019, 8:56 AM

## 2019-01-16 NOTE — Progress Notes (Signed)
Physical Therapy Treatment Patient Details Name: PETINA REYNEN MRN: [id number] DOB: 02/15/1961 Today's Date: 01/16/2019    History of Present Illness Patient is 58 y.o. female s/p Lt TKA on 01/15/19 with PMH significant for fibromyalgia, DM, A-fib, asthma, and thyroid disease.    PT Comments    POD # 1 pm session Assisted with amb a greater distance Then returned to room to perform some TE's following HEP handout.  Instructed on proper tech, freq as well as use of ICE.  Pt plans to D/C to home tomorrow.     Follow Up Recommendations  Follow surgeon's recommendation for DC plan and follow-up therapies     Equipment Recommendations  None recommended by PT    Recommendations for Other Services       Precautions / Restrictions Precautions Precautions: Fall    Mobility  Bed Mobility Overal bed mobility: Needs Assistance Bed Mobility: Sit to Supine     Supine to sit: Min assist;HOB elevated Sit to supine: Min assist   General bed mobility comments: demonstarted and instructed how to use a belt to self assist.  Required increased time.  Transfers Overall transfer level: Needs assistance Equipment used: Rolling walker (2 wheeled) Transfers: Sit to/from [facility] Sit to Stand: Min assist;From elevated surface;Min guard Stand pivot transfers: Min guard;Min assist       General transfer comment: 25% VC's on safety with turns and to advance L LE prior to sit.  Ambulation/Gait Ambulation/Gait assistance: Min guard;Min assist Gait Distance (Feet): 40 Feet Assistive device: Rolling walker (2 wheeled) Gait Pattern/deviations: Step-to pattern;Decreased stance time - left;Decreased step length - right;Decreased step length - left Gait velocity: decreased   General Gait Details: 25% VC's on proper walker to self distance.  Decreased gait speed.  Tolerated an increased distance.   Stairs             Wheelchair Mobility    Modified Rankin  (Stroke Patients Only)       Balance                                            Cognition   Behavior During Therapy: Flat affect                                          Exercises   Total Knee Replacement TE's 10 reps B LE ankle pumps 10 reps towel squeezes 10 reps knee presses  Followed by ICE     General Comments        Pertinent Vitals/Pain Pain Assessment: 0-10 Pain Score: 5  Pain Location: Lt knee Pain Descriptors / Indicators: Guarding;Grimacing;Sore Pain Intervention(s): Monitored during session;Premedicated before session;Repositioned;Ice applied    Home Living                      Prior Function            PT Goals (current goals can now be found in the care plan section) Progress towards PT goals: Progressing toward goals    Frequency    7X/week      PT Plan Current plan remains appropriate    Co-evaluation              AM-PAC PT "6 Clicks" Mobility  Outcome Measure  Help needed turning from your back to your side while in a flat bed without using bedrails?: A Little Help needed moving from lying on your back to sitting on the side of a flat bed without using bedrails?: A Little Help needed moving to and from a bed to a chair (including a wheelchair)?: A Little Help needed standing up from a chair using your arms (e.g., wheelchair or bedside chair)?: A Little Help needed to walk in hospital room?: A Little Help needed climbing 3-5 steps with a railing? : A Lot 6 Click Score: 17    End of Session Equipment Utilized During Treatment: Gait belt Activity Tolerance: Patient tolerated treatment well Patient left: in bed Nurse Communication: Mobility status PT Visit Diagnosis: Muscle weakness (generalized) (M62.81);Difficulty in walking, not elsewhere classified (R26.2)     Time: 1415-1440 PT Time Calculation (min) (ACUTE ONLY): 25 min  Charges:  $Gait Training: 8-22 mins $Therapeutic  Exercise: 8-22 mins                     {Alease Fait  PTA Acute  Rehabilitation Services Pager      [phone] Office      [phone]

## 2019-01-16 NOTE — Plan of Care (Signed)
  Problem: Activity: Goal: Ability to avoid complications of mobility impairment will improve Outcome: Progressing   Problem: Activity: Goal: Ability to avoid complications of mobility impairment will improve Outcome: Progressing   Problem: Pain Management: Goal: Pain level will decrease with appropriate interventions Outcome: Progressing   Problem: Activity: Goal: Ability to avoid complications of mobility impairment will improve Outcome: Progressing

## 2019-01-16 NOTE — Progress Notes (Signed)
Pt stable at this time though pain continues to be an issue. Estill Bamberg Ward PA notified of pt non readiness to go to today. Physical therapy concerned with pt progress.

## 2019-01-16 NOTE — Progress Notes (Signed)
Physical Therapy Treatment Patient Details Name: Carrie [name] MRN: [id number] DOB: 1960-04-04 Today's Date: 01/16/2019    History of Present Illness Patient is 58 y.o. female s/p Lt TKA on 01/15/19 with PMH significant for fibromyalgia, DM, A-fib, asthma, and thyroid disease.    PT Comments    POD # 1 am session Assisted OOB.   General bed mobility comments: demonstarted and instructed how to use a belt to self assist.  Required increased time. Assisted with amb.  General Gait Details: 25% VC's on proper walker to self distance.  Decreased gait speed. Then returned to room to perform some TE's following HEP handout.  Instructed on proper tech, freq as well as use of ICE.     Follow Up Recommendations  Follow surgeon's recommendation for DC plan and follow-up therapies     Equipment Recommendations  None recommended by PT    Recommendations for Other Services       Precautions / Restrictions Precautions Precautions: Fall    Mobility  Bed Mobility Overal bed mobility: Needs Assistance Bed Mobility: Supine to Sit     Supine to sit: Min assist;HOB elevated     General bed mobility comments: demonstarted and instructed how to use a belt to self assist.  Required increased time.  Transfers Overall transfer level: Needs assistance Equipment used: Rolling walker (2 wheeled) Transfers: Sit to/from [facility] Sit to Stand: Min assist;From elevated surface;Min guard Stand pivot transfers: Min guard;Min assist       General transfer comment: 25% VC's on safety with turns and to advance L LE prior to sit.  Ambulation/Gait Ambulation/Gait assistance: Min guard;Min assist Gait Distance (Feet): 25 Feet Assistive device: Rolling walker (2 wheeled) Gait Pattern/deviations: Step-to pattern;Decreased stance time - left;Decreased step length - right;Decreased step length - left Gait velocity: decreased   General Gait Details: 25% VC's on proper walker to self  distance.  Decreased gait speed.   Stairs             Wheelchair Mobility    Modified Rankin (Stroke Patients Only)       Balance                                            Cognition   Behavior During Therapy: Flat affect                                          Exercises   Total Knee Replacement TE's 10 reps B LE ankle pumps 10 reps towel squeezes 10 reps knee presses 10 reps heel slides   Followed by ICE     General Comments        Pertinent Vitals/Pain Pain Assessment: 0-10 Pain Score: 5  Pain Location: Lt knee Pain Descriptors / Indicators: Guarding;Grimacing;Sore Pain Intervention(s): Monitored during session;Premedicated before session;Repositioned;Ice applied    Home Living                      Prior Function            PT Goals (current goals can now be found in the care plan section) Progress towards PT goals: Progressing toward goals    Frequency    7X/week      PT Plan Current plan remains  appropriate    Co-evaluation              AM-PAC PT "6 Clicks" Mobility   Outcome Measure  Help needed turning from your back to your side while in a flat bed without using bedrails?: A Little Help needed moving from lying on your back to sitting on the side of a flat bed without using bedrails?: A Little Help needed moving to and from a bed to a chair (including a wheelchair)?: A Little Help needed standing up from a chair using your arms (e.g., wheelchair or bedside chair)?: A Little Help needed to walk in hospital room?: A Little Help needed climbing 3-5 steps with a railing? : A Lot 6 Click Score: 17    End of Session Equipment Utilized During Treatment: Gait belt Activity Tolerance: Patient tolerated treatment well Patient left: in chair;with call bell/phone within reach;with family/visitor present;with chair alarm set Nurse Communication: Mobility status PT Visit Diagnosis: Muscle  weakness (generalized) (M62.81);Difficulty in walking, not elsewhere classified (R26.2)     Time: [id number] PT Time Calculation (min) (ACUTE ONLY): 31 min  Charges:  $Gait Training: 8-22 mins $Therapeutic Exercise: 8-22 mins                     Rica Koyanagi  PTA Acute  Rehabilitation Services Pager      [phone] Office      [phone]

## 2019-01-16 NOTE — TOC Initial Note (Signed)
Transition of Care [facility]) - Initial/Assessment Note    Patient Details  Name: Carrie [name] MRN: [id number] Date of Birth: 1960/07/18  Transition of Care (TOC) CM/SW Contact:    Joaquin Courts, RN Phone Number: 01/16/2019, 1:04 PM  Clinical Narrative:   CM spoke with patient at bedside, patient set up with [facility] for [place].  Patient reports she has rolling walker and declines 3-in-1.                 Expected Discharge Plan: [place] Barriers to Discharge: No Barriers Identified   Patient Goals and CMS Choice        Expected Discharge Plan and Services Expected Discharge Plan: [place]   Discharge Planning Services: CM Consult   Living arrangements for the past 2 months: Single Family Home Expected Discharge Date: 01/17/19               DME Arranged: N/A DME Agency: NA       HH Arranged: PT [place] Agency: [facility] (formerly [employer]) Date [place]: 01/16/19 Time [place]: [place] Representative spoke with at [place]: Alwyn Ren  Prior Living Arrangements/Services Living arrangements for the past 2 months: [place] Lives with:: Spouse Patient language and need for interpreter reviewed:: Yes Do you feel safe going back to the place where you live?: Yes      Need for Family Participation in Patient Care: Yes (Comment) Care giver support system in place?: Yes (comment)   Criminal Activity/Legal Involvement Pertinent to Current Situation/Hospitalization: No - Comment as needed  Activities of Daily Living Home Assistive Devices/Equipment: None ADL Screening (condition at time of admission) Patient's cognitive ability adequate to safely complete daily activities?: Yes Is the patient deaf or have difficulty hearing?: No Does the patient have difficulty seeing, even when wearing glasses/contacts?: No Does the patient have difficulty concentrating, remembering, or making  decisions?: No Patient able to express need for assistance with ADLs?: Yes Does the patient have difficulty dressing or bathing?: No Independently performs ADLs?: Yes (appropriate for developmental age) Does the patient have difficulty walking or climbing stairs?: No Weakness of Legs: None Weakness of Arms/Hands: None  Permission Sought/Granted   Permission granted to share information with : Yes, Verbal Permission Granted     Permission granted to share info w AGENCY: [facility]        Emotional Assessment Appearance:: Appears stated age Attitude/Demeanor/Rapport: Engaged Affect (typically observed): Accepting Orientation: : Oriented to Self, Oriented to Place, Oriented to  Time, Oriented to Situation   Psych Involvement: No (comment)  Admission diagnosis:  Left knee osteoarthritis Patient Active Problem List   Diagnosis Date Noted  . Osteoarthritis of left knee 01/15/2019  . Facial asymmetry, acquired 01/01/2019  . Routine general medical examination at a health care facility 01/06/2018  . Muscle ache 07/31/2017  . Bilateral arm numbness and tingling while sleeping 11/26/2016  . Upper airway cough syndrome 06/27/2016  . Elevated blood sugar 01/17/2016  . Family history of colon cancer 06/14/2014  . Gastroesophageal reflux disease with hiatal hernia 06/02/2014  . Pain of left shoulder region 06/03/2013  . Incontinence 02/05/2011  . UNSPECIFIED INTERNAL DERANGEMENT OF KNEE 02/01/2010  . DEGENERATIVE DISC DISEASE, CERVICAL SPINE, W/RADICULOPATHY 04/21/2009  . Attention deficit disorder 11/30/2007  . Mild persistent asthma, uncomplicated [id number]   PCP:  Isaac Bliss, Rayford Halsted, MD Pharmacy:   [facility] [phone] - [place], [place] -  [address] [place] [address] Phone: [phone] Fax: [phone]     Social Determinants of Health (SDOH) Interventions    Readmission Risk  Interventions No flowsheet data found.

## 2019-01-17 MED ORDER — RIVAROXABAN 10 MG PO TABS: 10.0000 mg | ORAL_TABLET | Freq: Every day | ORAL | 0 refills | Status: DC

## 2019-01-17 NOTE — Discharge Instructions (Signed)

## 2019-01-17 NOTE — Progress Notes (Signed)
Pt stable at time of d/c. No needs at time of d/c. No changes to note. Pt has all home equipment.

## 2019-01-17 NOTE — Progress Notes (Signed)
Patient ID: Carrie [name], female   DOB: 02/14/1961, 58 y.o.   MRN: [id number] Subjective: 2 Days Post-Op Procedure(s) (LRB): TOTAL KNEE ARTHROPLASTY (Left)    Patient reports pain as mild to moderate.  No issues.  Feels ready to go home today  Objective:   VITALS:   Vitals:   01/16/19 2135 01/17/19 0548  BP: (!) 124/102 (!) 169/71  Pulse: 96 94  Resp: 16 16  Temp: 99.1 F (37.3 C) 99.9 F (37.7 C)  SpO2: 92% 93%    Neurovascular intact Incision: dressing C/D/I  LABS Recent Labs    01/15/19 1355  HGB 12.5  HCT 41.9  WBC 5.7  PLT 264    Recent Labs    01/15/19 1355  CREATININE 0.74    No results for input(s): LABPT, INR in the last 72 hours.   Assessment/Plan: 2 Days Post-Op Procedure(s) (LRB): TOTAL KNEE ARTHROPLASTY (Left)   Up with therapy  Home today after appropriate therapy session(s) RTC in 2 weeks HHPT for first week or 2 Reviewed goals and activity recommendations

## 2019-01-17 NOTE — Progress Notes (Signed)
Physical Therapy Treatment Patient Details Name: Carrie [name] MRN: [id number] DOB: 1960/07/24 Today's Date: 01/17/2019    History of Present Illness Patient is 58 y.o. female s/p Lt TKA on 01/15/19 with PMH significant for fibromyalgia, DM, A-fib, asthma, and thyroid disease.    PT Comments    Pt progressing well. Reviewed stairs to enter home and to go up to second level I fpt so desires. Will need crutches fo rhome. Husband present for session. Reviewed knee precautions. Reviewed importance of knee ROM,  Use of ice. Pt ready for d/c from PT standpoint.  Follow Up Recommendations  Follow surgeon's recommendation for DC plan and follow-up therapies     Equipment Recommendations  None recommended by PT    Recommendations for Other Services       Precautions / Restrictions Precautions Precautions: Fall;Knee Precaution Comments: reviewed knee precautions, no pillow under knee Restrictions Weight Bearing Restrictions: No Other Position/Activity Restrictions: WBAT    Mobility  Bed Mobility Overal bed mobility: Needs Assistance Bed Mobility: Supine to Sit;Sit to Supine     Supine to sit: Min guard;Supervision Sit to supine: Supervision;Min guard   General bed mobility comments: pt using gait belt as leg lifter, close supervision  Transfers Overall transfer level: Needs assistance Equipment used: Rolling walker (2 wheeled) Transfers: Sit to/from Stand Sit to Stand: Supervision         General transfer comment: cues for hand placement, LLE position, incr time but no physical assist   Ambulation/Gait Ambulation/Gait assistance: Min guard;Supervision Gait Distance (Feet): 50 Feet Assistive device: Rolling walker (2 wheeled) Gait Pattern/deviations: Step-to pattern;Decreased stance time - left;Decreased step length - right;Decreased step length - left Gait velocity: decreased   General Gait Details: cues for sequence and RW position   Stairs Stairs: Yes Stairs  assistance: Min guard Stair Management: No rails;Step to pattern;With walker;Backwards;With cane;One rail Left Number of Stairs: 10 General stair comments: up/down 3 steps with RW posterior technique, min/min-guard assist.  up/down stairs with rail and crutch. cues for technique    Wheelchair Mobility    Modified Rankin (Stroke Patients Only)       Balance                                            Cognition Arousal/Alertness: Awake/alert Behavior During Therapy: WFL for tasks assessed/performed Overall Cognitive Status: Within Functional Limits for tasks assessed                                        Exercises      General Comments        Pertinent Vitals/Pain Pain Assessment: 0-10 Pain Score: 4  Pain Location: Lt knee Pain Descriptors / Indicators: Guarding;Grimacing;Sore Pain Intervention(s): Limited activity within patient's tolerance;Monitored during session;Premedicated before session;Repositioned    Home Living                      Prior Function            PT Goals (current goals can now be found in the care plan section) Acute Rehab PT Goals Patient Stated Goal: to return home and get upstairs PT Goal Formulation: With patient Time For Goal Achievement: 01/22/19 Potential to Achieve Goals: Good Progress towards PT goals: Progressing toward goals  Frequency    7X/week      PT Plan Current plan remains appropriate    Co-evaluation              AM-PAC PT "6 Clicks" Mobility   Outcome Measure  Help needed turning from your back to your side while in a flat bed without using bedrails?: A Little Help needed moving from lying on your back to sitting on the side of a flat bed without using bedrails?: A Little Help needed moving to and from a bed to a chair (including a wheelchair)?: A Little Help needed standing up from a chair using your arms (e.g., wheelchair or bedside chair)?: A Little Help  needed to walk in hospital room?: A Little Help needed climbing 3-5 steps with a railing? : A Little 6 Click Score: 18    End of Session Equipment Utilized During Treatment: Gait belt Activity Tolerance: Patient tolerated treatment well Patient left: in bed;with call bell/phone within reach;with bed alarm set;with family/visitor present   PT Visit Diagnosis: Muscle weakness (generalized) (M62.81);Difficulty in walking, not elsewhere classified (R26.2)     Time: [id number] PT Time Calculation (min) (ACUTE ONLY): 30 min  Charges:  $Gait Training: 23-37 mins                     Kenyon Ana, PT  Pager: [phone] Acute Rehab Dept [facility]): [id number]   01/17/2019    [facility] 01/17/2019, 11:59 AM

## 2019-01-17 NOTE — Plan of Care (Signed)
  Problem: Pain Management: Goal: Pain level will decrease with appropriate interventions Outcome: Progressing   Problem: Skin Integrity: Goal: Will show signs of wound healing Outcome: Progressing   Problem: Activity: Goal: Ability to avoid complications of mobility impairment will improve Outcome: Progressing   Problem: Clinical Measurements: Goal: Postoperative complications will be avoided or minimized Outcome: Progressing

## 2019-01-19 ENCOUNTER — Encounter (HOSPITAL_COMMUNITY): Payer: Self-pay | Admitting: Specialist

## 2019-01-19 DIAGNOSIS — J4530 Mild persistent asthma, uncomplicated: Secondary | ICD-10-CM | POA: Diagnosis not present

## 2019-01-19 DIAGNOSIS — E119 Type 2 diabetes mellitus without complications: Secondary | ICD-10-CM | POA: Diagnosis not present

## 2019-01-19 DIAGNOSIS — E079 Disorder of thyroid, unspecified: Secondary | ICD-10-CM | POA: Diagnosis not present

## 2019-01-19 DIAGNOSIS — Z471 Aftercare following joint replacement surgery: Secondary | ICD-10-CM | POA: Diagnosis not present

## 2019-01-19 DIAGNOSIS — I4891 Unspecified atrial fibrillation: Secondary | ICD-10-CM | POA: Diagnosis not present

## 2019-01-19 DIAGNOSIS — Z96652 Presence of left artificial knee joint: Secondary | ICD-10-CM | POA: Diagnosis not present

## 2019-01-19 DIAGNOSIS — Z7901 Long term (current) use of anticoagulants: Secondary | ICD-10-CM | POA: Diagnosis not present

## 2019-01-19 DIAGNOSIS — F988 Other specified behavioral and emotional disorders with onset usually occurring in childhood and adolescence: Secondary | ICD-10-CM | POA: Diagnosis not present

## 2019-01-19 DIAGNOSIS — M5010 Cervical disc disorder with radiculopathy, unspecified cervical region: Secondary | ICD-10-CM | POA: Diagnosis not present

## 2019-01-19 DIAGNOSIS — Z7951 Long term (current) use of inhaled steroids: Secondary | ICD-10-CM | POA: Diagnosis not present

## 2019-01-19 DIAGNOSIS — M797 Fibromyalgia: Secondary | ICD-10-CM | POA: Diagnosis not present

## 2019-01-19 DIAGNOSIS — K219 Gastro-esophageal reflux disease without esophagitis: Secondary | ICD-10-CM | POA: Diagnosis not present

## 2019-01-19 DIAGNOSIS — I10 Essential (primary) hypertension: Secondary | ICD-10-CM | POA: Diagnosis not present

## 2019-01-21 DIAGNOSIS — I4891 Unspecified atrial fibrillation: Secondary | ICD-10-CM | POA: Diagnosis not present

## 2019-01-21 DIAGNOSIS — E079 Disorder of thyroid, unspecified: Secondary | ICD-10-CM | POA: Diagnosis not present

## 2019-01-21 DIAGNOSIS — J4530 Mild persistent asthma, uncomplicated: Secondary | ICD-10-CM | POA: Diagnosis not present

## 2019-01-21 DIAGNOSIS — Z7951 Long term (current) use of inhaled steroids: Secondary | ICD-10-CM | POA: Diagnosis not present

## 2019-01-21 DIAGNOSIS — M797 Fibromyalgia: Secondary | ICD-10-CM | POA: Diagnosis not present

## 2019-01-21 DIAGNOSIS — I10 Essential (primary) hypertension: Secondary | ICD-10-CM | POA: Diagnosis not present

## 2019-01-21 DIAGNOSIS — Z96652 Presence of left artificial knee joint: Secondary | ICD-10-CM | POA: Diagnosis not present

## 2019-01-21 DIAGNOSIS — K219 Gastro-esophageal reflux disease without esophagitis: Secondary | ICD-10-CM | POA: Diagnosis not present

## 2019-01-21 DIAGNOSIS — Z7901 Long term (current) use of anticoagulants: Secondary | ICD-10-CM | POA: Diagnosis not present

## 2019-01-21 DIAGNOSIS — M5010 Cervical disc disorder with radiculopathy, unspecified cervical region: Secondary | ICD-10-CM | POA: Diagnosis not present

## 2019-01-21 DIAGNOSIS — F988 Other specified behavioral and emotional disorders with onset usually occurring in childhood and adolescence: Secondary | ICD-10-CM | POA: Diagnosis not present

## 2019-01-21 DIAGNOSIS — Z471 Aftercare following joint replacement surgery: Secondary | ICD-10-CM | POA: Diagnosis not present

## 2019-01-21 DIAGNOSIS — E119 Type 2 diabetes mellitus without complications: Secondary | ICD-10-CM | POA: Diagnosis not present

## 2019-01-25 DIAGNOSIS — Z7951 Long term (current) use of inhaled steroids: Secondary | ICD-10-CM | POA: Diagnosis not present

## 2019-01-25 DIAGNOSIS — I4891 Unspecified atrial fibrillation: Secondary | ICD-10-CM | POA: Diagnosis not present

## 2019-01-25 DIAGNOSIS — I10 Essential (primary) hypertension: Secondary | ICD-10-CM | POA: Diagnosis not present

## 2019-01-25 DIAGNOSIS — E079 Disorder of thyroid, unspecified: Secondary | ICD-10-CM | POA: Diagnosis not present

## 2019-01-25 DIAGNOSIS — F988 Other specified behavioral and emotional disorders with onset usually occurring in childhood and adolescence: Secondary | ICD-10-CM | POA: Diagnosis not present

## 2019-01-25 DIAGNOSIS — Z96652 Presence of left artificial knee joint: Secondary | ICD-10-CM | POA: Diagnosis not present

## 2019-01-25 DIAGNOSIS — M797 Fibromyalgia: Secondary | ICD-10-CM | POA: Diagnosis not present

## 2019-01-25 DIAGNOSIS — E119 Type 2 diabetes mellitus without complications: Secondary | ICD-10-CM | POA: Diagnosis not present

## 2019-01-25 DIAGNOSIS — M5010 Cervical disc disorder with radiculopathy, unspecified cervical region: Secondary | ICD-10-CM | POA: Diagnosis not present

## 2019-01-25 DIAGNOSIS — Z7901 Long term (current) use of anticoagulants: Secondary | ICD-10-CM | POA: Diagnosis not present

## 2019-01-25 DIAGNOSIS — Z471 Aftercare following joint replacement surgery: Secondary | ICD-10-CM | POA: Diagnosis not present

## 2019-01-25 DIAGNOSIS — K219 Gastro-esophageal reflux disease without esophagitis: Secondary | ICD-10-CM | POA: Diagnosis not present

## 2019-01-25 DIAGNOSIS — J4530 Mild persistent asthma, uncomplicated: Secondary | ICD-10-CM | POA: Diagnosis not present

## 2019-01-25 NOTE — Discharge Summary (Signed)
Physician Discharge Summary  Patient ID: Carrie [name] MRN: [id number] DOB/AGE: 08-31-1960 58 y.o.  Admit date: 01/15/2019 Discharge date: 01/25/2019  Admission Diagnoses: Left knee Osteoarthritis  Discharge Diagnoses:  Active Problems:   Osteoarthritis of left knee   Discharged Condition: good  Hospital Course: patient was admitted on October 30 for a left total knee arthroplasty.  Patient did well during surgery.  She was sent to PACU and to the postop floor in stable condition.patient is doing well postop day 1.  She had no events overnight.  She was tolerating pain medication by mouth well.  She is getting up with therapy with no issues.postop day 2, patient is doing well.  She was discharged home with home therapy.  Consults: None  Significant Diagnostic Studies: None  Treatments: IV hydration, antibiotics: Clindamycin, analgesia: oxycodone and anticoagulation: Lovenox  Discharge Exam: Blood pressure (!) 169/71, pulse 94, temperature 99.9 F (37.7 C), temperature source Oral, resp. rate 16, height 5\' 3"  (1.6 m), weight 97.9 kg, SpO2 93 %. General appearance: alert, appears stated age and no distress Extremities: extremities normal, atraumatic, no cyanosis or edema, Homans sign is negative, no sign of DVT and no edema, redness or tenderness in the calves or thighs Pulses: 2+ and symmetric Skin: Skin color, texture, turgor normal. No rashes or lesions Neurologic: Alert and oriented X 3, normal strength and tone. Normal symmetric reflexes. Normal coordination and gait Incision/Wound: Dressings C, D, I  Disposition:   Discharge Instructions    Call MD / Call 911   Complete by: As directed    If you experience chest pain or shortness of breath, CALL 911 and be transported to the hospital emergency room.  If you develope a fever above 101 F, pus (white drainage) or increased drainage or redness at the wound, or calf pain, call your surgeon's office.   Call MD / Call 911    Complete by: As directed    If you experience chest pain or shortness of breath, CALL 911 and be transported to the hospital emergency room.  If you develope a fever above 101 F, pus (white drainage) or increased drainage or redness at the wound, or calf pain, call your surgeon's office.   Constipation Prevention   Complete by: As directed    Drink plenty of fluids.  Prune juice may be helpful.  You may use a stool softener, such as Colace (over the counter) 100 mg twice a day.  Use MiraLax (over the counter) for constipation as needed.   Constipation Prevention   Complete by: As directed    Drink plenty of fluids.  Prune juice may be helpful.  You may use a stool softener, such as Colace (over the counter) 100 mg twice a day.  Use MiraLax (over the counter) for constipation as needed.   Diet - low sodium heart healthy   Complete by: As directed    Discharge instructions   Complete by: As directed    Please keep dressings on until follow-up appointment Use Kling Wrap over dressings for shower and tape top and bottom Follow up in 2 weeks in the office Lovenox sent in for patient to do for 3 weeks   Do not put a pillow under the knee. Place it under the heel.   Complete by: As directed    Increase activity slowly as tolerated   Complete by: As directed    Increase activity slowly as tolerated   Complete by: As directed    TED hose  Complete by: As directed    Use stockings (TED hose) for 2 weeks on bilateral leg(s).  You may remove them at night for sleeping.     Allergies as of 01/17/2019      Reactions   Aspirin    REACTION: asthma- on respirator for 28 days   Codeine Phosphate Hives   Influenza Vaccines    Soreness, increased asthma, side pain, trouble walking   Penicillins Hives   REACTION: wheezing and hives Did it involve swelling of the face/tongue/throat, SOB, or low BP? no Did it involve sudden or severe rash/hives, skin peeling, or any reaction on the inside of your  mouth or nose? No Did you need to seek medical attention at a hospital or doctor's office? no When did it last happen?Childhood If all above answers are "NO", may proceed with cephalosporin use.      Medication List    TAKE these medications   acetaminophen 500 MG tablet Commonly known as: TYLENOL Take 1,000 mg by mouth every 6 (six) hours as needed for mild pain or moderate pain.   albuterol (2.5 MG/3ML) 0.083% nebulizer solution Commonly known as: PROVENTIL USE 1 VIAL VIA NEBULIZER EVERY 4 HOURS AS NEEDED FOR WHEEZE/SHORTNESS OF BREATH What changed:   how much to take  how to take this  when to take this  reasons to take this  additional instructions   ProAir HFA 108 (90 Base) MCG/ACT inhaler Generic drug: albuterol USE 2 INHALATIONS EVERY 6 HOURS AS NEEDED What changed: See the new instructions.   amphetamine-dextroamphetamine 20 MG 24 hr capsule Commonly known as: Adderall XR Take 1 capsule (20 mg total) by mouth 2 (two) times daily.   amphetamine-dextroamphetamine 20 MG 24 hr capsule Commonly known as: ADDERALL XR Take 1 capsule (20 mg total) by mouth 2 (two) times daily.   amphetamine-dextroamphetamine 20 MG 24 hr capsule Commonly known as: ADDERALL XR Take 1 capsule (20 mg total) by mouth 2 (two) times daily.   budesonide-formoterol 160-4.5 MCG/ACT inhaler Commonly known as: Symbicort Inhale 2 puffs into the lungs 2 (two) times daily.   DULoxetine 60 MG capsule Commonly known as: CYMBALTA Take 1 capsule (60 mg total) by mouth daily.   famotidine 20 MG tablet Commonly known as: Pepcid One at bedtime   gabapentin 300 MG capsule Commonly known as: NEURONTIN Take 2 in the morning, 1 midday, take 2 in the evening What changed:   how much to take  how to take this  when to take this  additional instructions   levothyroxine 75 MCG tablet Commonly known as: SYNTHROID Take 75 mcg by mouth daily before breakfast.   methocarbamol 500 MG  tablet Commonly known as: ROBAXIN Take 1 tablet (500 mg total) by mouth every 6 (six) hours as needed for muscle spasms.   montelukast 10 MG tablet Commonly known as: SINGULAIR 1 p.o. nightly What changed:   how much to take  how to take this  when to take this  additional instructions   ondansetron 4 MG tablet Commonly known as: Zofran Take 1 tablet (4 mg total) by mouth daily as needed for nausea or vomiting.   oxyCODONE 5 MG immediate release tablet Commonly known as: Roxicodone Take 1 tablet (5 mg total) by mouth every 8 (eight) hours as needed.   pantoprazole 40 MG tablet Commonly known as: PROTONIX Take 30-60 min before first meal of the day What changed:   how much to take  how to take this  when to  take this   rivaroxaban 10 MG Tabs tablet Commonly known as: Xarelto Take 1 tablet (10 mg total) by mouth daily for 21 days.   tolterodine 4 MG 24 hr capsule Commonly known as: Detrol LA 2 capsules daily      Follow-up Information    [facility] Follow up.   Specialty: Home Health Services Why: agency will provide home health physical therapy, agency will call you to schedule first visit. Contact information: [address] [place] [address] [phone]        Sydnee Cabal, MD Follow up in 2 week(s).   Specialty: Orthopedic Surgery Why: follow up and wound check Contact information: [address] [place] [address] [id number]           Signed: Drue Novel 01/25/2019, 9:00 AM

## 2019-01-29 DIAGNOSIS — I10 Essential (primary) hypertension: Secondary | ICD-10-CM | POA: Diagnosis not present

## 2019-01-29 DIAGNOSIS — J4530 Mild persistent asthma, uncomplicated: Secondary | ICD-10-CM | POA: Diagnosis not present

## 2019-01-29 DIAGNOSIS — E079 Disorder of thyroid, unspecified: Secondary | ICD-10-CM | POA: Diagnosis not present

## 2019-01-29 DIAGNOSIS — Z7901 Long term (current) use of anticoagulants: Secondary | ICD-10-CM | POA: Diagnosis not present

## 2019-01-29 DIAGNOSIS — Z471 Aftercare following joint replacement surgery: Secondary | ICD-10-CM | POA: Diagnosis not present

## 2019-01-29 DIAGNOSIS — K219 Gastro-esophageal reflux disease without esophagitis: Secondary | ICD-10-CM | POA: Diagnosis not present

## 2019-01-29 DIAGNOSIS — M5010 Cervical disc disorder with radiculopathy, unspecified cervical region: Secondary | ICD-10-CM | POA: Diagnosis not present

## 2019-01-29 DIAGNOSIS — I4891 Unspecified atrial fibrillation: Secondary | ICD-10-CM | POA: Diagnosis not present

## 2019-01-29 DIAGNOSIS — Z96652 Presence of left artificial knee joint: Secondary | ICD-10-CM | POA: Diagnosis not present

## 2019-01-29 DIAGNOSIS — M797 Fibromyalgia: Secondary | ICD-10-CM | POA: Diagnosis not present

## 2019-01-29 DIAGNOSIS — Z7951 Long term (current) use of inhaled steroids: Secondary | ICD-10-CM | POA: Diagnosis not present

## 2019-01-29 DIAGNOSIS — E119 Type 2 diabetes mellitus without complications: Secondary | ICD-10-CM | POA: Diagnosis not present

## 2019-01-29 DIAGNOSIS — F988 Other specified behavioral and emotional disorders with onset usually occurring in childhood and adolescence: Secondary | ICD-10-CM | POA: Diagnosis not present

## 2019-02-01 DIAGNOSIS — Z4789 Encounter for other orthopedic aftercare: Secondary | ICD-10-CM | POA: Diagnosis not present

## 2019-02-02 DIAGNOSIS — M5010 Cervical disc disorder with radiculopathy, unspecified cervical region: Secondary | ICD-10-CM | POA: Diagnosis not present

## 2019-02-02 DIAGNOSIS — F988 Other specified behavioral and emotional disorders with onset usually occurring in childhood and adolescence: Secondary | ICD-10-CM | POA: Diagnosis not present

## 2019-02-02 DIAGNOSIS — I4891 Unspecified atrial fibrillation: Secondary | ICD-10-CM | POA: Diagnosis not present

## 2019-02-02 DIAGNOSIS — E119 Type 2 diabetes mellitus without complications: Secondary | ICD-10-CM | POA: Diagnosis not present

## 2019-02-02 DIAGNOSIS — J4530 Mild persistent asthma, uncomplicated: Secondary | ICD-10-CM | POA: Diagnosis not present

## 2019-02-02 DIAGNOSIS — Z471 Aftercare following joint replacement surgery: Secondary | ICD-10-CM | POA: Diagnosis not present

## 2019-02-02 DIAGNOSIS — M797 Fibromyalgia: Secondary | ICD-10-CM | POA: Diagnosis not present

## 2019-02-02 DIAGNOSIS — Z7951 Long term (current) use of inhaled steroids: Secondary | ICD-10-CM | POA: Diagnosis not present

## 2019-02-02 DIAGNOSIS — E079 Disorder of thyroid, unspecified: Secondary | ICD-10-CM | POA: Diagnosis not present

## 2019-02-02 DIAGNOSIS — I10 Essential (primary) hypertension: Secondary | ICD-10-CM | POA: Diagnosis not present

## 2019-02-02 DIAGNOSIS — K219 Gastro-esophageal reflux disease without esophagitis: Secondary | ICD-10-CM | POA: Diagnosis not present

## 2019-02-02 DIAGNOSIS — Z96652 Presence of left artificial knee joint: Secondary | ICD-10-CM | POA: Diagnosis not present

## 2019-02-02 DIAGNOSIS — Z7901 Long term (current) use of anticoagulants: Secondary | ICD-10-CM | POA: Diagnosis not present

## 2019-02-04 ENCOUNTER — Emergency Department (HOSPITAL_COMMUNITY)
Admission: EM | Admit: 2019-02-04 | Discharge: 2019-02-04 | Disposition: A | Discharge status: Home / Self Care | Payer: BC Managed Care – PPO | Attending: Emergency Medicine | Admitting: Emergency Medicine

## 2019-02-04 ENCOUNTER — Emergency Department (HOSPITAL_COMMUNITY): Payer: BC Managed Care – PPO

## 2019-02-04 DIAGNOSIS — R531 Weakness: Secondary | ICD-10-CM | POA: Insufficient documentation

## 2019-02-04 DIAGNOSIS — R000 Tachycardia, unspecified: Secondary | ICD-10-CM | POA: Diagnosis not present

## 2019-02-04 DIAGNOSIS — E119 Type 2 diabetes mellitus without complications: Secondary | ICD-10-CM | POA: Insufficient documentation

## 2019-02-04 DIAGNOSIS — R0902 Hypoxemia: Secondary | ICD-10-CM | POA: Diagnosis not present

## 2019-02-04 DIAGNOSIS — J45909 Unspecified asthma, uncomplicated: Secondary | ICD-10-CM | POA: Diagnosis not present

## 2019-02-04 DIAGNOSIS — Z79899 Other long term (current) drug therapy: Secondary | ICD-10-CM | POA: Diagnosis not present

## 2019-02-04 DIAGNOSIS — I4891 Unspecified atrial fibrillation: Secondary | ICD-10-CM | POA: Diagnosis not present

## 2019-02-04 LAB — CBC WITH DIFFERENTIAL/PLATELET
Abs Immature Granulocytes: 0.02 10*3/uL (ref 0.00–0.07)
Basophils Absolute: 0 10*3/uL (ref 0.0–0.1)
Basophils Relative: 1 %
Eosinophils Absolute: 0 10*3/uL (ref 0.0–0.5)
Eosinophils Relative: 1 %
HCT: 38.5 % (ref 36.0–46.0)
Hemoglobin: 11.9 g/dL — ABNORMAL LOW (ref 12.0–15.0)
Immature Granulocytes: 1 %
Lymphocytes Relative: 28 %
Lymphs Abs: 1.2 10*3/uL (ref 0.7–4.0)
MCH: 27.8 pg (ref 26.0–34.0)
MCHC: 30.9 g/dL (ref 30.0–36.0)
MCV: 90 fL (ref 80.0–100.0)
Monocytes Absolute: 0.3 10*3/uL (ref 0.1–1.0)
Monocytes Relative: 7 %
Neutro Abs: 2.7 10*3/uL (ref 1.7–7.7)
Neutrophils Relative %: 62 %
Platelets: 437 10*3/uL — ABNORMAL HIGH (ref 150–400)
RBC: 4.28 MIL/uL (ref 3.87–5.11)
RDW: 15 % (ref 11.5–15.5)
WBC: 4.2 10*3/uL (ref 4.0–10.5)
nRBC: 0 % (ref 0.0–0.2)

## 2019-02-04 LAB — URINALYSIS, ROUTINE W REFLEX MICROSCOPIC
Bilirubin Urine: NEGATIVE
Glucose, UA: NEGATIVE mg/dL
Hgb urine dipstick: NEGATIVE
Ketones, ur: 80 mg/dL — AB
Leukocytes,Ua: NEGATIVE
Nitrite: NEGATIVE
Protein, ur: NEGATIVE mg/dL
Specific Gravity, Urine: 1.023 (ref 1.005–1.030)
pH: 7 (ref 5.0–8.0)

## 2019-02-04 LAB — COMPREHENSIVE METABOLIC PANEL
ALT: 19 U/L (ref 0–44)
AST: 25 U/L (ref 15–41)
Albumin: 3.6 g/dL (ref 3.5–5.0)
Alkaline Phosphatase: 103 U/L (ref 38–126)
Anion gap: 12 (ref 5–15)
BUN: 7 mg/dL (ref 6–20)
CO2: 24 mmol/L (ref 22–32)
Calcium: 9.1 mg/dL (ref 8.9–10.3)
Chloride: 101 mmol/L (ref 98–111)
Creatinine, Ser: 0.56 mg/dL (ref 0.44–1.00)
GFR calc Af Amer: >60 mL/min (ref >60)
GFR calc non Af Amer: >60 mL/min (ref >60)
Glucose, Bld: 130 mg/dL — ABNORMAL HIGH (ref 70–99)
Potassium: 3.6 mmol/L (ref 3.5–5.1)
Sodium: 137 mmol/L (ref 135–145)
Total Bilirubin: 0.6 mg/dL (ref 0.3–1.2)
Total Protein: 7.5 g/dL (ref 6.5–8.1)

## 2019-02-04 LAB — LIPASE, BLOOD: Lipase: 22 U/L (ref 11–51)

## 2019-02-04 MED ORDER — SODIUM CHLORIDE 0.9 % IV BOLUS
1000.0000 mL | Freq: Once | INTRAVENOUS | Status: AC
  Administered 2019-02-04: 1000 mL via INTRAVENOUS

## 2019-02-04 NOTE — ED Provider Notes (Signed)
[facility] DEPT Provider Note   CSN: [id number] Arrival date & time: 02/04/19  [id number]     History   Chief Complaint Chief Complaint  Patient presents with  . Weakness    HPI Carrie [name] is a 58 y.o. female.     The history is provided by the patient.  Weakness Severity:  Mild Onset quality:  Gradual Timing:  Constant Progression:  Unchanged Chronicity:  New Context comment:  Patient states decreased energy and poor appettite over the last few days. No pain.  Relieved by:  Nothing Worsened by:  Nothing Associated symptoms: no abdominal pain, no arthralgias, no chest pain, no cough, no dysuria, no fever, no seizures, no shortness of breath and no vomiting   Risk factors: no coronary artery disease     Past Medical History:  Diagnosis Date  . A-fib ([place])   . ADD (attention deficit disorder)   . Adrenal abnormality ([place])   . Anemia, iron deficiency   . Asthma   . Diabetes mellitus without complication ([place])   . Fibromyalgia   . Hiatal hernia   . Numbness and tingling   . Obese   . Thyroid disease     Patient Active Problem List   Diagnosis Date Noted  . Osteoarthritis of left knee 01/15/2019  . Facial asymmetry, acquired 01/01/2019  . Routine general medical examination at a health care facility 01/06/2018  . Muscle ache 07/31/2017  . Bilateral arm numbness and tingling while sleeping 11/26/2016  . Upper airway cough syndrome 06/27/2016  . Elevated blood sugar 01/17/2016  . Family history of colon cancer 06/14/2014  . Gastroesophageal reflux disease with hiatal hernia 06/02/2014  . Pain of left shoulder region 06/03/2013  . Incontinence 02/05/2011  . UNSPECIFIED INTERNAL DERANGEMENT OF KNEE 02/01/2010  . DEGENERATIVE DISC DISEASE, CERVICAL SPINE, W/RADICULOPATHY 04/21/2009  . Attention deficit disorder 11/30/2007  . Mild persistent asthma, uncomplicated [id number]    Past Surgical History:  Procedure Laterality Date   . ABDOMINAL HYSTERECTOMY    . KNEE SURGERY    . SHOULDER SURGERY    . TOTAL KNEE ARTHROPLASTY Left 01/15/2019   Procedure: TOTAL KNEE ARTHROPLASTY;  Surgeon: Sydnee Cabal, MD;  Location: [facility] ORS;  Service: Orthopedics;  Laterality: Left;     OB History   No obstetric history on file.      Home Medications    Prior to Admission medications   Medication Sig Start Date End Date Taking? Authorizing Provider  acetaminophen (TYLENOL) 325 MG tablet Take 650 mg by mouth every 6 (six) hours as needed for mild pain or headache.   Yes [provider]  albuterol (PROVENTIL) (2.5 MG/3ML) 0.083% nebulizer solution USE 1 VIAL VIA NEBULIZER EVERY 4 HOURS AS NEEDED FOR WHEEZE/SHORTNESS OF BREATH Patient taking differently: Take 2.5 mg by nebulization every 4 (four) hours as needed for wheezing or shortness of breath.  02/04/14  Yes Dorena Cookey, MD  amphetamine-dextroamphetamine (ADDERALL XR) 20 MG 24 hr capsule Take 1 capsule (20 mg total) by mouth 2 (two) times daily. 12/31/18  Yes Isaac Bliss, Rayford Halsted, MD  budesonide-formoterol [facility]) 160-4.5 MCG/ACT inhaler Inhale 2 puffs into the lungs 2 (two) times daily. 03/19/18  Yes Tanda Rockers, MD  DULoxetine (CYMBALTA) 60 MG capsule Take 1 capsule (60 mg total) by mouth daily. Patient taking differently: Take 60 mg by mouth daily.  12/17/18  Yes Suzzanne Cloud, NP  gabapentin (NEURONTIN) 300 MG capsule Take 2 in the morning, 1 midday,  take 2 in the evening Patient taking differently: Take 300 mg by mouth See admin instructions. Take 600 mg  in the morning, 300 mg midday, take 600 mg in the evening 12/22/18  Yes Suzzanne Cloud, NP  levothyroxine (SYNTHROID) 75 MCG tablet Take 75 mcg by mouth daily before breakfast.   Yes [provider]  montelukast (SINGULAIR) 10 MG tablet 1 p.o. nightly Patient taking differently: Take 10 mg by mouth at bedtime.  02/20/18  Yes Tanda Rockers, MD  oxyCODONE (ROXICODONE) 5 MG immediate release  tablet Take 1 tablet (5 mg total) by mouth every 8 (eight) hours as needed. Patient taking differently: Take 5 mg by mouth every 8 (eight) hours as needed for moderate pain.  01/15/19 01/15/20 Yes Haus, Leeanne R, PA  pantoprazole (PROTONIX) 40 MG tablet Take 30-60 min before first meal of the day Patient taking differently: Take 40 mg by mouth daily before breakfast. Take 30-60 min before first meal of the day 02/20/18  Yes Tanda Rockers, MD  PROAIR HFA 108 [phone] Base) MCG/ACT inhaler USE 2 INHALATIONS EVERY 6 HOURS AS NEEDED Patient taking differently: Inhale 2 puffs into the lungs every 6 (six) hours as needed for shortness of breath.  07/15/17  Yes Dorena Cookey, MD  rivaroxaban (XARELTO) 10 MG TABS tablet Take 1 tablet (10 mg total) by mouth daily for 21 days. 01/18/19 02/08/19 Yes Maurice March, PA-C  amphetamine-dextroamphetamine (ADDERALL XR) 20 MG 24 hr capsule Take 1 capsule (20 mg total) by mouth 2 (two) times daily. Patient not taking: Reported on 02/04/2019 12/31/18   Isaac Bliss, Rayford Halsted, MD  amphetamine-dextroamphetamine (ADDERALL XR) 20 MG 24 hr capsule Take 1 capsule (20 mg total) by mouth 2 (two) times daily. Patient not taking: Reported on 02/04/2019 12/31/18   Isaac Bliss, Rayford Halsted, MD  famotidine (PEPCID) 20 MG tablet One at bedtime Patient not taking: Reported on 02/04/2019 02/23/18   Tanda Rockers, MD  methocarbamol (ROBAXIN) 500 MG tablet Take 1 tablet (500 mg total) by mouth every 6 (six) hours as needed for muscle spasms. Patient not taking: Reported on 02/04/2019 01/15/19   Elizabeth Sauer R, PA  ondansetron (ZOFRAN) 4 MG tablet Take 1 tablet (4 mg total) by mouth daily as needed for nausea or vomiting. 01/15/19 01/15/20  Drue Novel, PA  tolterodine (DETROL LA) 4 MG 24 hr capsule 2 capsules daily Patient not taking: Reported on 02/04/2019 05/12/18   Isaac Bliss, Rayford Halsted, MD    Family History Family History  Problem Relation Age of Onset  .  Asthma Mother        seasonal  . Colon cancer Mother   . Lung cancer Maternal Uncle   . Esophageal cancer Father        smoker, mets to lungs  . Diabetes Sister     Social History Social History   Tobacco Use  . Smoking status: Never Smoker  . Smokeless tobacco: Never Used  Substance Use Topics  . Alcohol use: Yes    Comment: occ  . Drug use: No     Allergies   Aspirin, Codeine phosphate, Influenza vaccines, and Penicillins   Review of Systems Review of Systems  Constitutional: Negative for chills and fever.  HENT: Negative for ear pain and sore throat.   Eyes: Negative for pain and visual disturbance.  Respiratory: Negative for cough and shortness of breath.   Cardiovascular: Negative for chest pain and palpitations.  Gastrointestinal: Negative for abdominal pain  and vomiting.  Genitourinary: Negative for dysuria and hematuria.  Musculoskeletal: Negative for arthralgias and back pain.  Skin: Negative for color change and rash.  Neurological: Positive for weakness. Negative for seizures and syncope.  All other systems reviewed and are negative.    Physical Exam Updated Vital Signs  ED Triage Vitals [02/04/19 0850]  Enc Vitals Group     BP 130/69     Pulse Rate 97     Resp 18     Temp 98.9 F (37.2 C)     Temp Source Oral     SpO2 95 %     Weight 201 lb (91.2 kg)     Height 5' 3.5" (1.613 m)     Head Circumference      Peak Flow      Pain Score      Pain Loc      Pain Edu?      Excl. in [place]?     Physical Exam Vitals signs and nursing note reviewed.  Constitutional:      General: She is not in acute distress.    Appearance: She is well-developed. She is not ill-appearing.  HENT:     Head: Normocephalic and atraumatic.     Nose: Nose normal.     Mouth/Throat:     Mouth: Mucous membranes are moist.  Eyes:     Extraocular Movements: Extraocular movements intact.     Conjunctiva/sclera: Conjunctivae normal.     Pupils: Pupils are equal, round, and  reactive to light.  Neck:     Musculoskeletal: Normal range of motion and neck supple.  Cardiovascular:     Rate and Rhythm: Normal rate and regular rhythm.     Heart sounds: No murmur.  Pulmonary:     Effort: Pulmonary effort is normal. No respiratory distress.     Breath sounds: Normal breath sounds.  Abdominal:     General: Abdomen is flat.     Palpations: Abdomen is soft.     Tenderness: There is no abdominal tenderness.  Skin:    General: Skin is warm and dry.     Capillary Refill: Capillary refill takes less than 2 seconds.  Neurological:     General: No focal deficit present.     Mental Status: She is alert and oriented to person, place, and time.     Cranial Nerves: No cranial nerve deficit.     Sensory: No sensory deficit.     Motor: No weakness.     Coordination: Coordination normal.  Psychiatric:        Mood and Affect: Mood normal.      ED Treatments / Results  Labs (all labs ordered are listed, but only abnormal results are displayed) Labs Reviewed  CBC WITH DIFFERENTIAL/PLATELET - Abnormal; Notable for the following components:      Result Value   Hemoglobin 11.9 (*)    Platelets 437 (*)    All other components within normal limits  COMPREHENSIVE METABOLIC PANEL - Abnormal; Notable for the following components:   Glucose, Bld 130 (*)    All other components within normal limits  URINALYSIS, ROUTINE W REFLEX MICROSCOPIC - Abnormal; Notable for the following components:   Color, Urine AMBER (*)    Ketones, ur 80 (*)    All other components within normal limits  LIPASE, BLOOD    EKG EKG Interpretation  Date/Time:  Thursday February 04 2019 08:52:23 EST Ventricular Rate:  97 PR Interval:    QRS Duration: 90  QT Interval:  377 QTC Calculation: 479 R Axis:   44 Text Interpretation: Sinus rhythm Confirmed by Lennice Sites [phone]) on 02/04/2019 10:02:31 AM   Radiology Dg Chest Portable 1 View  Result Date: 02/04/2019 CLINICAL DATA:  Weakness.   Atrial fibrillation. EXAM: PORTABLE CHEST 1 VIEW COMPARISON:  June 01, 2014 FINDINGS: Lungs are clear. Heart size and pulmonary vascularity are normal. No adenopathy. No bone lesions. IMPRESSION: No edema or consolidation.  Cardiac silhouette within normal limits. Electronically Signed   By: Lowella Grip III M.D.   On: 02/04/2019 09:45    Procedures Procedures (including critical care time)  Medications Ordered in ED Medications  sodium chloride 0.9 % bolus 1,000 mL (0 mLs Intravenous Stopped 02/04/19 1200)     Initial Impression / Assessment and Plan / ED Course  I have reviewed the triage vital signs and the nursing notes.  Pertinent labs & imaging results that were available during my care of the patient were reviewed by me and considered in my medical decision making (see chart for details).     Carrie [name] is a 58 year old female with history of fibromyalgia, anemia, diabetes who presents to the ED with generalized weakness over the last several days.  Normal vitals.  No fever.  No specific symptoms.  Just states she feels generally weak.  Has had poor appetite.  Does not have any chest pain, shortness of breath, abdominal pain.  No urinary symptoms.  Overall she appears well but may be dehydrated.  Will check blood count to make sure she is not anemic.  Will check for urinary infection or pneumonia.  Will give IV fluid hydration.  No significant anemia, electrolyte abnormality, kidney injury.  Chest x-ray without signs of infection.  Awaiting urinalysis.  Anticipate discharge to home.  Possibly weakness is from poor p.o. intake.    Urinalysis negative for infection.  Patient felt better after IV fluids.  She did have some mild ketones in her urine some may be she has mild dehydration.  Recommend increased hydration and nutrition at home.  She was able to ambulate without any issues.  Discharged in good condition.  This chart was dictated using voice recognition software.   Despite best efforts to proofread,  errors can occur which can change the documentation meaning.    Final Clinical Impressions(s) / ED Diagnoses   Final diagnoses:  Generalized weakness    ED Discharge Orders    None       Lennice Sites, DO 02/04/19 1313

## 2019-02-04 NOTE — ED Notes (Signed)
Patient ambulatory to the restroom with walker with minimal assistance.

## 2019-02-04 NOTE — ED Triage Notes (Signed)
Transported by [facility] from home for generalized weakness since Monday-- patient had knee replacement surgery done 10/30. Denies any shob, chest pain. Hx of type 1 DM. AAO x 4. CBG 149 mg/dl with EMS.

## 2019-02-04 NOTE — ED Notes (Signed)
Urine Culture sent with UA 

## 2019-02-09 DIAGNOSIS — Z96652 Presence of left artificial knee joint: Secondary | ICD-10-CM | POA: Diagnosis not present

## 2019-02-09 DIAGNOSIS — M1712 Unilateral primary osteoarthritis, left knee: Secondary | ICD-10-CM | POA: Diagnosis not present

## 2019-03-01 DIAGNOSIS — M25562 Pain in left knee: Secondary | ICD-10-CM | POA: Diagnosis not present

## 2019-03-01 DIAGNOSIS — M25662 Stiffness of left knee, not elsewhere classified: Secondary | ICD-10-CM | POA: Diagnosis not present

## 2019-03-03 ENCOUNTER — Ambulatory Visit: Payer: BC Managed Care – PPO | Admitting: Internal Medicine

## 2019-03-05 DIAGNOSIS — M25562 Pain in left knee: Secondary | ICD-10-CM | POA: Diagnosis not present

## 2019-03-05 DIAGNOSIS — M25662 Stiffness of left knee, not elsewhere classified: Secondary | ICD-10-CM | POA: Diagnosis not present

## 2019-03-08 DIAGNOSIS — M25562 Pain in left knee: Secondary | ICD-10-CM | POA: Diagnosis not present

## 2019-03-08 DIAGNOSIS — M25662 Stiffness of left knee, not elsewhere classified: Secondary | ICD-10-CM | POA: Diagnosis not present

## 2019-03-10 DIAGNOSIS — M25662 Stiffness of left knee, not elsewhere classified: Secondary | ICD-10-CM | POA: Diagnosis not present

## 2019-03-10 DIAGNOSIS — M25562 Pain in left knee: Secondary | ICD-10-CM | POA: Diagnosis not present

## 2019-03-15 DIAGNOSIS — M25662 Stiffness of left knee, not elsewhere classified: Secondary | ICD-10-CM | POA: Diagnosis not present

## 2019-03-15 DIAGNOSIS — M25562 Pain in left knee: Secondary | ICD-10-CM | POA: Diagnosis not present

## 2019-03-16 ENCOUNTER — Other Ambulatory Visit: Payer: Self-pay | Admitting: Internal Medicine

## 2019-03-16 DIAGNOSIS — F988 Other specified behavioral and emotional disorders with onset usually occurring in childhood and adolescence: Secondary | ICD-10-CM

## 2019-03-16 NOTE — Telephone Encounter (Signed)
Can you please deny this prescription.  The pharmacy should "use the prescription on file".

## 2019-03-17 ENCOUNTER — Other Ambulatory Visit: Payer: Self-pay | Admitting: Internal Medicine

## 2019-03-17 ENCOUNTER — Encounter: Payer: Self-pay | Admitting: Internal Medicine

## 2019-03-17 DIAGNOSIS — F988 Other specified behavioral and emotional disorders with onset usually occurring in childhood and adolescence: Secondary | ICD-10-CM

## 2019-03-17 DIAGNOSIS — M25562 Pain in left knee: Secondary | ICD-10-CM | POA: Diagnosis not present

## 2019-03-17 DIAGNOSIS — M25662 Stiffness of left knee, not elsewhere classified: Secondary | ICD-10-CM | POA: Diagnosis not present

## 2019-03-18 NOTE — Telephone Encounter (Signed)
Message routed to PCP CMA  

## 2019-03-18 NOTE — Telephone Encounter (Signed)
Spoke with pharmacist and a Rx will be filled today.

## 2019-03-22 ENCOUNTER — Encounter: Payer: Self-pay | Admitting: Internal Medicine

## 2019-03-22 ENCOUNTER — Ambulatory Visit (INDEPENDENT_AMBULATORY_CARE_PROVIDER_SITE_OTHER): Payer: BC Managed Care – PPO | Admitting: Internal Medicine

## 2019-03-22 ENCOUNTER — Encounter: Payer: Self-pay | Admitting: *Deleted

## 2019-03-22 ENCOUNTER — Other Ambulatory Visit: Payer: Self-pay

## 2019-03-22 DIAGNOSIS — J4530 Mild persistent asthma, uncomplicated: Secondary | ICD-10-CM | POA: Diagnosis not present

## 2019-03-22 DIAGNOSIS — Z72821 Inadequate sleep hygiene: Secondary | ICD-10-CM

## 2019-03-22 NOTE — Progress Notes (Signed)
Subjective:    Patient ID: Carrie [name], female   DOB: 08-Feb-1961    MRN: [id number]     Brief patient profile:  41 yobf never smoker onset asthma age 59 and placed on shots for "everything that grows" needed to stay on inhalers / complicated by 3 admits requiring ventilator last time around age 61 and overall much better since then but intermittently on prednisone and as of 2011 required daily prednisone so referred to pulmonary clinic 02/13/2016 by Dr   Carrie [name] allergy shots but off  Around 2000    History of Present Illness  02/13/2016 1st [facility] office visit/ Carrie [name]  maint rx qvar 80 2bid/ singiulair/ pred 10  Chief Complaint  Patient presents with  . Pulmonary Consult    Referred by Dr. Sherren [name] for eval of Asthma. Pt states that she has been on prednisone for years for her asthma. She is currently not having any symptoms.   for the last six years only comes off prednisone for a few days then back on it due to wheeze/ sob s much cough and presently on 10 mg daily and qvar 80 2 every 12 hours and no saba x sev weeks   Apparently doesn't taper below 10 in any increment Has intermittent hb on prn ppi  rec Pantoprazole (protonix) 40 mg   Take  30-60 min before first meal of the day and Pepcid (famotidine)  20 mg one @  bedtime until return to office    Qvar 80 Take 2 puffs first thing in am and then another 2 puffs about 12 hours later.  Work on inhaler technique:     Only use your albuterol as a rescue medication  Try Prednisone 5 mg daily x 2 weeks then  5 alternating with 2.5 mg (one-half pill) - if worse resume previous dose     04/16/2016  f/u ov/Carrie [name] re: steroid dep asthma with pred down to 5 mg daily / qvar 80 2bid / singulair /gerd rx  Chief Complaint  Patient presents with  . Follow-up    Breathing is overall doing well. She c/o minimal congestion "behind my nose"- just started today. She is on pred 5 mg daily and has not went any lower than that. She has  not had to use her proair.   Not limited by breathing from desired activities  / no saba use at all  Some nausea at the lower doses of pred but very happy with wt loss on the lower doses rec Take 5 mg one half daily x 2 weeks then one half even days x 2 weeks then one half every 3 days x 2 weeks and stop. If get worse nausea/ loss of appetite at any point go back up to higher dose  and alternate doses even/odd days    06/26/2016  f/u ov/Carrie [name] re:  Chronic  Asthma / on qvar 80 2bid / singulair/ gerd rx  and cortef per Carrie [name] for adrenal insuff Chief Complaint  Patient presents with  . Follow-up    Pt c/o hoarseness from the pollen. Pt denies cough, wheezing, and SOB, CP/tightness.    saba once a week at most / some daytime only  throat clearing but o/w doing fine rec GERD diet  Plan A = Automatic = Qvar 80 Take 2 puffs first thing in am and then another 2 puffs about 12 hours later.  Plan B = Backup Only use your albuterol as a rescue   Plan  C = Crisis - only use your albuterol nebulizer if you first try Plan B and it fails to help > ok to use the nebulizer up to every 4 hours but if start needing it regularly call for immediate appointment         02/20/2018  f/u ov/Carrie [name] re: mild asthma reduced  pred down to pred 2 mg bid then asthma flared whereas  on 5mg  bid no breakthru Chief Complaint  Patient presents with  . Follow-up    Breathing is doing well today. She is using her albuterol inhaler several times per day. She rarely uses neb.  Dyspnea:  Not limited by breathing from desired activities  - needing saba sev times a week  Cough: no cough  Sleeping: wakes up 3-4 x week feeling wheezing > takes  albuterol and back to bed / last 12 hours prio  SABA use: sev times a day s pattern indoors vs outdoors, time of day or temp  02: none  Noct symptoms  worse off pepcid with overt noct hb  rec Stop qvar  Start symbicort 160 ake 2 puffs first thing in am and then another 2 puffs about  12 hours later.  Only use your albuterol as a rescue medication  Work on inhaler technique.     03/19/2018  f/u ov/Carrie [name] re: chronic asthma / off pred mid Dec 2019 / maint symb 160 2bid and no rescue on hand  Chief Complaint  Patient presents with  . Follow-up    Breathing is doing well and she has not used her albuterol inhaler recently. No new co's.   Dyspnea:  Not limited by breathing from desired activities / but by L knee Cough: no issue  Sleeping: ok now flat/ one pillow  SABA use: no rescue  rec Plan A = Automatic = symbicort 160 Take 2 puffs first thing in am and then another 2 puffs about 12 hours later.  Plan B = Backup Only use your albuterol inhaler as a rescue medication Plan C = Crisis - only use your albuterol nebulizer if you first try Plan B and it fails to help > ok to use the nebulizer up to every 4 hours but if start needing it regularly call for immediate appointment    09/17/2018  f/u ov/Carrie [name] re:  Asthma  Chief Complaint  Patient presents with  . Follow-up    Breathing is doing well. She has had a scratchy throat and so stopped using symbicort and then developed body aches and dizziness- started back on symbicort and symptoms resolved. She states she has to clear her throat often. She uses her rescue inhaler once per wk on average and rarely uses neb.   Dyspnea:  Not limited by breathing from desired activities  But by L knee pain Cough: none Sleeping: flat/ one pillow SABA use: once a week/ more when stopped symbicort 02: none Onset June 15th st, aches mostly no cough or fever > covid test June 17th pending in meantime all acute symptoms have resolved  rec Try to time your symbicort to before you brush your teeth with a baking soda based tooth paste  No change in medications Only use your albuterol as a rescue medication    L  TKR  01/15/2019  Onset 01/31/2019 fatigue and loss of smell ER 02/04/19 not tested > health dept pos COVID 19   03/22/2019  f/u  ov/Carrie [name] re: asthma s/p covid infection outpt r  Chief Complaint  Patient presents with  .  Follow-up    Asthma, no symptoms  Dyspnea:  Not limited by breathing from desired activities   Cough: none  Sleeping: poorly due to insomnia since covid  but no resp c/o's on one pillow SABA use: hfa rarely 02: none    No obvious day to day or daytime variability or assoc excess/ purulent sputum or mucus plugs or hemoptysis or cp or chest tightness, subjective wheeze or overt sinus or hb symptoms.   Sleeps  without nocturnal  or early am exacerbation  of respiratory  c/o's or need for noct saba. Also denies any obvious fluctuation of symptoms with weather or environmental changes or other aggravating or alleviating factors except as outlined above   No unusual exposure hx or h/o childhood pna  or knowledge of premature birth.  Current Allergies, Complete Past Medical History, Past Surgical History, Family History, and Social History were reviewed in [employer] record.  ROS  The following are not active complaints unless bolded Hoarseness, sore throat, dysphagia, dental problems, itching, sneezing,  nasal congestion or discharge of excess mucus or purulent secretions, ear ache,   fever, chills, sweats, unintended wt loss or wt gain, classically pleuritic or exertional cp,  orthopnea pnd or arm/hand swelling  or leg swelling, presyncope, palpitations, abdominal pain, anorexia, nausea, vomiting, diarrhea  or change in bowel habits or change in bladder habits, change in stools or change in urine, dysuria, hematuria,  rash, arthralgias, visual complaints, headache, numbness, weakness or ataxia or problems with walking or coordination,  change in mood or  memory.        Current Meds  Medication Sig  . acetaminophen (TYLENOL) 325 MG tablet Take 650 mg by mouth every 6 (six) hours as needed for mild pain or headache.  . albuterol (PROVENTIL) (2.5 MG/3ML) 0.083% nebulizer solution USE 1  VIAL VIA NEBULIZER EVERY 4 HOURS AS NEEDED FOR WHEEZE/SHORTNESS OF BREATH (Patient taking differently: Take 2.5 mg by nebulization every 4 (four) hours as needed for wheezing or shortness of breath. )  . amphetamine-dextroamphetamine (ADDERALL XR) 20 MG 24 hr capsule Take 1 capsule (20 mg total) by mouth 2 (two) times daily.  . budesonide-formoterol (SYMBICORT) 160-4.5 MCG/ACT inhaler Inhale 2 puffs into the lungs 2 (two) times daily.  . DULoxetine (CYMBALTA) 60 MG capsule Take 1 capsule (60 mg total) by mouth daily. (Patient taking differently: Take 60 mg by mouth daily. )  . gabapentin (NEURONTIN) 300 MG capsule Take 2 in the morning, 1 midday, take 2 in the evening (Patient taking differently: Take 300 mg by mouth See admin instructions. Take 600 mg  in the morning, 300 mg midday, take 600 mg in the evening)  . levothyroxine (SYNTHROID) 75 MCG tablet Take 75 mcg by mouth daily before breakfast.  . methocarbamol (ROBAXIN) 500 MG tablet Take 1 tablet (500 mg total) by mouth every 6 (six) hours as needed for muscle spasms.  . montelukast (SINGULAIR) 10 MG tablet 1 p.o. nightly (Patient taking differently: Take 10 mg by mouth at bedtime. )  . ondansetron (ZOFRAN) 4 MG tablet Take 1 tablet (4 mg total) by mouth daily as needed for nausea or vomiting.  Marland Kitchen oxyCODONE (ROXICODONE) 5 MG immediate release tablet Take 1 tablet (5 mg total) by mouth every 8 (eight) hours as needed. (Patient taking differently: Take 5 mg by mouth every 8 (eight) hours as needed for moderate pain. )  . pantoprazole (PROTONIX) 40 MG tablet Take 30-60 min before first meal of the day (Patient taking  differently: Take 40 mg by mouth daily before breakfast. Take 30-60 min before first meal of the day)  . PROAIR HFA 108 (90 Base) MCG/ACT inhaler USE 2 INHALATIONS EVERY 6 HOURS AS NEEDED (Patient taking differently: Inhale 2 puffs into the lungs every 6 (six) hours as needed for shortness of breath. )  . tolterodine (DETROL LA) 4 MG 24 hr  capsule 2 capsules daily              Objective:   Physical Exam   03/22/2019         197   09/17/2018        203  03/19/2018         206  02/20/2018       203  12/27/2016     173  06/26/2016        188  04/16/2016       191   02/13/16 203 lb 3.2 oz (92.2 kg)  02/05/16 200 lb (90.7 kg)  01/24/16 200 lb (90.7 kg)      Vital signs reviewed - Note on arrival 02 sats  97% on RA   HEENT : pt wearing mask not removed for exam due to covid -19 concerns.    NECK :  without JVD/Nodes/TM/ nl carotid upstrokes bilaterally   LUNGS: no acc muscle use,  Nl contour chest which is clear to A and P bilaterally without cough on insp or exp maneuvers   CV:  RRR  no s3 or murmur or increase in P2, and no edema   ABD:  soft and nontender with nl inspiratory excursion in the supine position. No bruits or organomegaly appreciated, bowel sounds nl  MS:  Nl gait/ ext warm without deformities, calf tenderness, cyanosis or clubbing No obvious joint restrictions   SKIN: warm and dry without lesions    NEURO:  alert, approp, nl sensorium with  no motor or cerebellar deficits apparent.                         Assessment:

## 2019-03-22 NOTE — Patient Instructions (Signed)
Avoid all caffeine   Avoid direct light for at least an hour before before and read for 30 minutes before sleep   Ok to return to full time work without restrictions from a pulmonary perspective.   Please schedule a follow up visit in 12  months but call sooner if needed

## 2019-03-23 ENCOUNTER — Encounter: Payer: Self-pay | Admitting: Internal Medicine

## 2019-03-23 DIAGNOSIS — M25662 Stiffness of left knee, not elsewhere classified: Secondary | ICD-10-CM | POA: Diagnosis not present

## 2019-03-23 DIAGNOSIS — Z72821 Inadequate sleep hygiene: Secondary | ICD-10-CM | POA: Insufficient documentation

## 2019-03-23 DIAGNOSIS — M25562 Pain in left knee: Secondary | ICD-10-CM | POA: Diagnosis not present

## 2019-03-23 NOTE — Assessment & Plan Note (Signed)
Onset nov 2020 with covid infection   Reviewed all aspects of sleep hygiene to restore normal cycle s needing medications at this point   I had an extended discussion with the patient and reviewed all relevant studies so total time was 30 minutes with moderate level of MDM.  Each maintenance medication was reviewed in detail including most importantly the difference between maintenance and prns and under what circumstances the prns are to be triggered using an action plan format that is not reflected in the computer generated alphabetically organized AVS.     Please see AVS for specific instructions unique to this visit that I personally wrote and verbalized to the the pt in detail and then reviewed with pt  by my nurse highlighting any  changes in therapy recommended at today's visit to their plan of care.

## 2019-03-23 NOTE — Assessment & Plan Note (Signed)
Onset age 59 Spirometry 02/13/2016  Nl x mid flows reduced to 56% and min curvature  pre - saba on qvar 80 2bid and pred 10 mg daily > tapered off as of 06/26/2016 only on physiologic steroids per dr Christeen Douglas guidance  - 12/27/2016     continue qvar 80 2bid - Spirometry 02/20/2018  FEV1 1.6 (78%)  Ratio 68 with mild curvature > 6 h since last saba  - FENO 02/20/2018  =   147 on qvar 80 2bid  - 02/20/2018    try step up to symb 160 2bid from qvar and off chronic pred mid dec 2019 - 09/17/2018  After extensive coaching inhaler device,  effectiveness =    90%  On maint symb 160 2bid >>> all  goals of chronic asthma control met including optimal function and elimination of symptoms with minimal need for rescue therapy.  Contingencies discussed in full including contacting this office immediately if not controlling the symptoms using the rule of two's.

## 2019-03-23 NOTE — Telephone Encounter (Signed)
Dr. Melvyn Novas, Patient sent this message this afternoon for you to advise.      Dr Melvyn Novas,   Based on having COVID less than 90 days ago would you be concerned with me having a Manual Manipulation procedure on my Knee?

## 2019-03-25 DIAGNOSIS — M25562 Pain in left knee: Secondary | ICD-10-CM | POA: Diagnosis not present

## 2019-03-25 DIAGNOSIS — M25662 Stiffness of left knee, not elsewhere classified: Secondary | ICD-10-CM | POA: Diagnosis not present

## 2019-03-29 ENCOUNTER — Ambulatory Visit: Payer: BC Managed Care – PPO | Admitting: Neurology

## 2019-03-31 DIAGNOSIS — Z96652 Presence of left artificial knee joint: Secondary | ICD-10-CM | POA: Diagnosis not present

## 2019-03-31 DIAGNOSIS — Z471 Aftercare following joint replacement surgery: Secondary | ICD-10-CM | POA: Diagnosis not present

## 2019-04-06 ENCOUNTER — Ambulatory Visit (INDEPENDENT_AMBULATORY_CARE_PROVIDER_SITE_OTHER): Payer: BC Managed Care – PPO | Admitting: Internal Medicine

## 2019-04-06 ENCOUNTER — Encounter: Payer: Self-pay | Admitting: Internal Medicine

## 2019-04-06 ENCOUNTER — Other Ambulatory Visit: Payer: Self-pay

## 2019-04-06 ENCOUNTER — Other Ambulatory Visit: Payer: Self-pay | Admitting: Internal Medicine

## 2019-04-06 VITALS — BP 110/80 | HR 92 | Temp 97.7°F | Ht 63.0 in | Wt 196.7 lb

## 2019-04-06 DIAGNOSIS — E1140 Type 2 diabetes mellitus with diabetic neuropathy, unspecified: Secondary | ICD-10-CM | POA: Diagnosis not present

## 2019-04-06 DIAGNOSIS — E559 Vitamin D deficiency, unspecified: Secondary | ICD-10-CM

## 2019-04-06 DIAGNOSIS — E785 Hyperlipidemia, unspecified: Secondary | ICD-10-CM | POA: Insufficient documentation

## 2019-04-06 DIAGNOSIS — F988 Other specified behavioral and emotional disorders with onset usually occurring in childhood and adolescence: Secondary | ICD-10-CM | POA: Diagnosis not present

## 2019-04-06 DIAGNOSIS — Z0000 Encounter for general adult medical examination without abnormal findings: Secondary | ICD-10-CM

## 2019-04-06 DIAGNOSIS — K449 Diaphragmatic hernia without obstruction or gangrene: Secondary | ICD-10-CM

## 2019-04-06 DIAGNOSIS — J4530 Mild persistent asthma, uncomplicated: Secondary | ICD-10-CM

## 2019-04-06 DIAGNOSIS — M1712 Unilateral primary osteoarthritis, left knee: Secondary | ICD-10-CM

## 2019-04-06 DIAGNOSIS — K219 Gastro-esophageal reflux disease without esophagitis: Secondary | ICD-10-CM

## 2019-04-06 DIAGNOSIS — E876 Hypokalemia: Secondary | ICD-10-CM

## 2019-04-06 DIAGNOSIS — E1169 Type 2 diabetes mellitus with other specified complication: Secondary | ICD-10-CM | POA: Insufficient documentation

## 2019-04-06 LAB — COMPREHENSIVE METABOLIC PANEL
ALT: 14 U/L (ref 0–35)
AST: 15 U/L (ref 0–37)
Albumin: 4.1 g/dL (ref 3.5–5.2)
Alkaline Phosphatase: 96 U/L (ref 39–117)
BUN: 7 mg/dL (ref 6–23)
CO2: 30 mEq/L (ref 19–32)
Calcium: 9.5 mg/dL (ref 8.4–10.5)
Chloride: 104 mEq/L (ref 96–112)
Creatinine, Ser: 0.58 mg/dL (ref 0.40–1.20)
GFR: 128.73 mL/min (ref >60.00)
Glucose, Bld: 117 mg/dL — ABNORMAL HIGH (ref 70–99)
Potassium: 3.4 mEq/L — ABNORMAL LOW (ref 3.5–5.1)
Sodium: 142 mEq/L (ref 135–145)
Total Bilirubin: 0.4 mg/dL (ref 0.2–1.2)
Total Protein: 6.6 g/dL (ref 6.0–8.3)

## 2019-04-06 LAB — LIPID PANEL
Cholesterol: 220 mg/dL — ABNORMAL HIGH (ref 0–200)
HDL: 48.1 mg/dL (ref >39.00)
LDL Cholesterol: 138 mg/dL — ABNORMAL HIGH (ref 0–99)
NonHDL: 172.11
Total CHOL/HDL Ratio: 5
Triglycerides: 169 mg/dL — ABNORMAL HIGH (ref 0.0–149.0)
VLDL: 33.8 mg/dL (ref 0.0–40.0)

## 2019-04-06 LAB — CBC WITH DIFFERENTIAL/PLATELET
Basophils Absolute: 0 10*3/uL (ref 0.0–0.1)
Basophils Relative: 1.1 % (ref 0.0–3.0)
Eosinophils Absolute: 0.1 10*3/uL (ref 0.0–0.7)
Eosinophils Relative: 3.8 % (ref 0.0–5.0)
HCT: 38.6 % (ref 36.0–46.0)
Hemoglobin: 12.5 g/dL (ref 12.0–15.0)
Lymphocytes Relative: 39.4 % (ref 12.0–46.0)
Lymphs Abs: 1.5 10*3/uL (ref 0.7–4.0)
MCHC: 32.4 g/dL (ref 30.0–36.0)
MCV: 87.1 fl (ref 78.0–100.0)
Monocytes Absolute: 0.3 10*3/uL (ref 0.1–1.0)
Monocytes Relative: 7.3 % (ref 3.0–12.0)
Neutro Abs: 1.8 10*3/uL (ref 1.4–7.7)
Neutrophils Relative %: 48.4 % (ref 43.0–77.0)
Platelets: 293 10*3/uL (ref 150.0–400.0)
RBC: 4.43 Mil/uL (ref 3.87–5.11)
RDW: 17.4 % — ABNORMAL HIGH (ref 11.5–15.5)
WBC: 3.8 10*3/uL — ABNORMAL LOW (ref 4.0–10.5)

## 2019-04-06 LAB — HEMOGLOBIN A1C: Hgb A1c MFr Bld: 5.7 % (ref 4.6–6.5)

## 2019-04-06 LAB — VITAMIN D 25 HYDROXY (VIT D DEFICIENCY, FRACTURES): VITD: 26.7 ng/mL — ABNORMAL LOW (ref 30.00–100.00)

## 2019-04-06 LAB — TSH: TSH: 1.86 u[IU]/mL (ref 0.35–4.50)

## 2019-04-06 LAB — VITAMIN B12: Vitamin B-12: 478 pg/mL (ref 211–911)

## 2019-04-06 MED ORDER — VITAMIN D (ERGOCALCIFEROL) 1.25 MG (50000 UNIT) PO CAPS: 50000.0000 [IU] | ORAL_CAPSULE | ORAL | 0 refills | Status: AC

## 2019-04-06 MED ORDER — ATORVASTATIN CALCIUM 20 MG PO TABS: 20.0000 mg | ORAL_TABLET | Freq: Every day | ORAL | 1 refills | Status: DC

## 2019-04-06 MED ORDER — POTASSIUM CHLORIDE CRYS ER 20 MEQ PO TBCR: 20.0000 meq | EXTENDED_RELEASE_TABLET | Freq: Every day | ORAL | 0 refills | Status: DC

## 2019-04-06 NOTE — Progress Notes (Signed)
Established Patient Office Visit     This visit occurred during the SARS-CoV-2 public health emergency.  Safety protocols were in place, including screening questions prior to the visit, additional usage of staff PPE, and extensive cleaning of exam room while observing appropriate contact time as indicated for disinfecting solutions.    CC/Reason for Visit: Annual preventive exam  HPI: Carrie [name] is a 59 y.o. female who is coming in today for the above mentioned reasons. Past Medical History is significant for: Type 2 diabetes off all diabetic medication at the instruction of her endocrinologist, hyperlipidemia off statin due to transaminitis.  She has no acute concerns today.  Vaccines are up-to-date with the exception of shingles, she would like to hold on that for now.  She has routine dental care, needs eye care.  She had a colonoscopy in 2016 and is a 10-year callback, she had a mammogram in February 2020, states she has complete hysterectomy and has not had a Pap smear since.   Past Medical/Surgical History: Past Medical History:  Diagnosis Date  . A-fib ([place])   . ADD (attention deficit disorder)   . Adrenal abnormality ([place])   . Anemia, iron deficiency   . Asthma   . Diabetes mellitus without complication ([place])   . Fibromyalgia   . Hiatal hernia   . Numbness and tingling   . Obese   . Thyroid disease     Past Surgical History:  Procedure Laterality Date  . ABDOMINAL HYSTERECTOMY    . KNEE SURGERY    . SHOULDER SURGERY    . TOTAL KNEE ARTHROPLASTY Left 01/15/2019   Procedure: TOTAL KNEE ARTHROPLASTY;  Surgeon: Sydnee Cabal, MD;  Location: [facility];  Service: Orthopedics;  Laterality: Left;    Social History:  reports that she has never smoked. She has never used smokeless tobacco. She reports current alcohol use. She reports that she does not use drugs.  Allergies: Allergies  Allergen Reactions  . Aspirin     REACTION: asthma- on respirator for 28 days   . Codeine Phosphate Hives  . Influenza Vaccines     Soreness, increased asthma, side pain, trouble walking  . Penicillins Hives    REACTION: wheezing and hives  Did it involve swelling of the face/tongue/throat, SOB, or low BP? no Did it involve sudden or severe rash/hives, skin peeling, or any reaction on the inside of your mouth or nose? No Did you need to seek medical attention at a hospital or doctor's office? no When did it last happen?Childhood If all above answers are "NO", may proceed with cephalosporin use.    Family History:  Family History  Problem Relation Age of Onset  . Asthma Mother        seasonal  . Colon cancer Mother   . Lung cancer Maternal Uncle   . Esophageal cancer Father        smoker, mets to lungs  . Diabetes Sister      Current Outpatient Medications:  .  acetaminophen (TYLENOL) 325 MG tablet, Take 650 mg by mouth every 6 (six) hours as needed for mild pain or headache., Disp: , Rfl:  .  albuterol (PROVENTIL) (2.5 MG/3ML) 0.083% nebulizer solution, USE 1 VIAL VIA NEBULIZER EVERY 4 HOURS AS NEEDED FOR WHEEZE/SHORTNESS OF BREATH (Patient taking differently: Take 2.5 mg by nebulization every 4 (four) hours as needed for wheezing or shortness of breath. ), Disp: 75 mL, Rfl: 6 .  amphetamine-dextroamphetamine (ADDERALL XR) 20 MG 24  hr capsule, Take 1 capsule (20 mg total) by mouth 2 (two) times daily., Disp: 60 capsule, Rfl: 0 .  budesonide-formoterol (SYMBICORT) 160-4.5 MCG/ACT inhaler, Inhale 2 puffs into the lungs 2 (two) times daily., Disp: 1 Inhaler, Rfl: 0 .  DULoxetine (CYMBALTA) 60 MG capsule, Take 1 capsule (60 mg total) by mouth daily. (Patient taking differently: Take 60 mg by mouth daily. ), Disp: 90 capsule, Rfl: 1 .  gabapentin (NEURONTIN) 300 MG capsule, Take 2 in the morning, 1 midday, take 2 in the evening (Patient taking differently: Take 300 mg by mouth See admin instructions. Take 600 mg  in the morning, 300 mg midday, take 600 mg in  the evening), Disp: 150 capsule, Rfl: 11 .  levothyroxine (SYNTHROID) 75 MCG tablet, Take 75 mcg by mouth daily before breakfast., Disp: , Rfl:  .  montelukast (SINGULAIR) 10 MG tablet, 1 p.o. nightly (Patient taking differently: Take 10 mg by mouth at bedtime. ), Disp: 100 tablet, Rfl: 4 .  pantoprazole (PROTONIX) 40 MG tablet, Take 30-60 min before first meal of the day (Patient taking differently: Take 40 mg by mouth daily before breakfast. Take 30-60 min before first meal of the day), Disp: 90 tablet, Rfl: 11 .  PROAIR HFA 108 (90 Base) MCG/ACT inhaler, USE 2 INHALATIONS EVERY 6 HOURS AS NEEDED (Patient taking differently: Inhale 2 puffs into the lungs every 6 (six) hours as needed for shortness of breath. ), Disp: 25.5 g, Rfl: 3 .  tolterodine (DETROL LA) 4 MG 24 hr capsule, 2 capsules daily, Disp: 180 capsule, Rfl: 1 .  methocarbamol (ROBAXIN) 500 MG tablet, Take 1 tablet (500 mg total) by mouth every 6 (six) hours as needed for muscle spasms., Disp: 40 tablet, Rfl: 0  Review of Systems:  Constitutional: Denies fever, chills, diaphoresis, appetite change and fatigue.  HEENT: Denies photophobia, eye pain, redness, hearing loss, ear pain, congestion, sore throat, rhinorrhea, sneezing, mouth sores, trouble swallowing, neck pain, neck stiffness and tinnitus.   Respiratory: Denies SOB, DOE, cough, chest tightness,  and wheezing.   Cardiovascular: Denies chest pain, palpitations and leg swelling.  Gastrointestinal: Denies nausea, vomiting, abdominal pain, diarrhea, constipation, blood in stool and abdominal distention.  Genitourinary: Denies dysuria, urgency, frequency, hematuria, flank pain and difficulty urinating.  Endocrine: Denies: hot or cold intolerance, sweats, changes in hair or nails, polyuria, polydipsia. Musculoskeletal: Denies myalgias, back pain, joint swelling, arthralgias and gait problem.  Skin: Denies pallor, rash and wound.  Neurological: Denies dizziness, seizures, syncope,  weakness, light-headedness, numbness and headaches.  Hematological: Denies adenopathy. Easy bruising, personal or family bleeding history  Psychiatric/Behavioral: Denies suicidal ideation, mood changes, confusion, nervousness, sleep disturbance and agitation    Physical Exam: Vitals:   04/06/19 0744  BP: 110/80  Pulse: 92  Temp: 97.7 F (36.5 C)  TempSrc: Temporal  SpO2: 98%  Weight: 196 lb 11.2 oz (89.2 kg)  Height: '5\' 3"'  (1.6 m)    Body mass index is 34.84 kg/m.   Constitutional: NAD, calm, comfortable Eyes: PERRL, lids and conjunctivae normal ENMT: Mucous membranes are moist. Tympanic membrane is pearly white, no erythema or bulging. Neck: normal, supple, no masses, no thyromegaly Respiratory: clear to auscultation bilaterally, no wheezing, no crackles. Normal respiratory effort. No accessory muscle use.  Cardiovascular: Regular rate and rhythm, no murmurs / rubs / gallops. No extremity edema. 2+ pedal pulses.  Abdomen: no tenderness, no masses palpated. No hepatosplenomegaly. Bowel sounds positive.  Musculoskeletal: no clubbing / cyanosis. No joint deformity upper and lower extremities.  Good ROM, no contractures. Normal muscle tone.  Skin: no rashes, lesions, ulcers. No induration Neurologic: CN 2-12 grossly intact. Sensation intact, DTR normal. Strength 5/5 in all 4.  Psychiatric: Normal judgment and insight. Alert and oriented x 3. Normal mood.    Impression and Plan:  Encounter for preventive health examination -Have advised routine eye and dental care. -She is due for shingles vaccination series which she declines, otherwise immunizations are up-to-date. -Screening labs today. -Healthy lifestyle has been discussed in detail. -She had a colonoscopy in 2016 and is a 10-year callback. -Mammogram will be due in February 2021. -She has been advised to confer with her GYN whether Pap smears no longer need to be done.  Primary osteoarthritis of left knee -She had a  knee replacement about 3 months ago, has had a slow recovery as she had to miss PT sessions due to Covid diagnosis.  Attention deficit disorder, unspecified hyperactivity presence -Not due for refills today.  Mild persistent asthma, uncomplicated -Compensated, no recent flares  Gastroesophageal reflux disease with hiatal hernia -Well-controlled  Type 2 diabetes mellitus with diabetic neuropathy, without long-term current use of insulin (HCC)  -Check A1c today, last A1c was 6.4 in October 2020.    Patient Instructions  -Nice seeing you today!!  -Lab work today; will notify you once results are available.  -Schedule follow up in 4 months.  -Make sure you have your eye exam.   Preventive Care 42-96 Years Old, Female Preventive care refers to visits with your health care provider and lifestyle choices that can promote health and wellness. This includes:  A yearly physical exam. This may also be called an annual well check.  Regular dental visits and eye exams.  Immunizations.  Screening for certain conditions.  Healthy lifestyle choices, such as eating a healthy diet, getting regular exercise, not using drugs or products that contain nicotine and tobacco, and limiting alcohol use. What can I expect for my preventive care visit? Physical exam Your health care provider will check your:  Height and weight. This may be used to calculate body mass index (BMI), which tells if you are at a healthy weight.  Heart rate and blood pressure.  Skin for abnormal spots. Counseling Your health care provider may ask you questions about your:  Alcohol, tobacco, and drug use.  Emotional well-being.  Home and relationship well-being.  Sexual activity.  Eating habits.  Work and work Statistician.  Method of birth control.  Menstrual cycle.  Pregnancy history. What immunizations do I need?  Influenza (flu) vaccine  This is recommended every year. Tetanus, diphtheria, and  pertussis (Tdap) vaccine  You may need a Td booster every 10 years. Varicella (chickenpox) vaccine  You may need this if you have not been vaccinated. Zoster (shingles) vaccine  You may need this after age 19. Measles, mumps, and rubella (MMR) vaccine  You may need at least one dose of MMR if you were born in 1957 or later. You may also need a second dose. Pneumococcal conjugate (PCV13) vaccine  You may need this if you have certain conditions and were not previously vaccinated. Pneumococcal polysaccharide (PPSV23) vaccine  You may need one or two doses if you smoke cigarettes or if you have certain conditions. Meningococcal conjugate (MenACWY) vaccine  You may need this if you have certain conditions. Hepatitis A vaccine  You may need this if you have certain conditions or if you travel or work in places where you may be exposed to hepatitis A. Hepatitis  B vaccine  You may need this if you have certain conditions or if you travel or work in places where you may be exposed to hepatitis B. Haemophilus influenzae type b (Hib) vaccine  You may need this if you have certain conditions. Human papillomavirus (HPV) vaccine  If recommended by your health care provider, you may need three doses over 6 months. You may receive vaccines as individual doses or as more than one vaccine together in one shot (combination vaccines). Talk with your health care provider about the risks and benefits of combination vaccines. What tests do I need? Blood tests  Lipid and cholesterol levels. These may be checked every 5 years, or more frequently if you are over 8 years old.  Hepatitis C test.  Hepatitis B test. Screening  Lung cancer screening. You may have this screening every year starting at age 44 if you have a 30-pack-year history of smoking and currently smoke or have quit within the past 15 years.  Colorectal cancer screening. All adults should have this screening starting at age 31 and  continuing until age 40. Your health care provider may recommend screening at age 9 if you are at increased risk. You will have tests every 1-10 years, depending on your results and the type of screening test.  Diabetes screening. This is done by checking your blood sugar (glucose) after you have not eaten for a while (fasting). You may have this done every 1-3 years.  Mammogram. This may be done every 1-2 years. Talk with your health care provider about when you should start having regular mammograms. This may depend on whether you have a family history of breast cancer.  BRCA-related cancer screening. This may be done if you have a family history of breast, ovarian, tubal, or peritoneal cancers.  Pelvic exam and Pap test. This may be done every 3 years starting at age 64. Starting at age 53, this may be done every 5 years if you have a Pap test in combination with an HPV test. Other tests  Sexually transmitted disease (STD) testing.  Bone density scan. This is done to screen for osteoporosis. You may have this scan if you are at high risk for osteoporosis. Follow these instructions at home: Eating and drinking  Eat a diet that includes fresh fruits and vegetables, whole grains, lean protein, and low-fat dairy.  Take vitamin and mineral supplements as recommended by your health care provider.  Do not drink alcohol if: ? Your health care provider tells you not to drink. ? You are pregnant, may be pregnant, or are planning to become pregnant.  If you drink alcohol: ? Limit how much you have to 0-1 drink a day. ? Be aware of how much alcohol is in your drink. In the [place] one drink equals one 12 oz bottle of beer (355 mL), one 5 oz glass of wine (148 mL), or one 1 oz glass of hard liquor (44 mL). Lifestyle  Take daily care of your teeth and gums.  Stay active. Exercise for at least 30 minutes on 5 or more days each week.  Do not use any products that contain nicotine or tobacco,  such as cigarettes, e-cigarettes, and chewing tobacco. If you need help quitting, ask your health care provider.  If you are sexually active, practice safe sex. Use a condom or other form of birth control (contraception) in order to prevent pregnancy and STIs (sexually transmitted infections).  If told by your health care provider, take low-dose  aspirin daily starting at age 84. What's next?  Visit your health care provider once a year for a well check visit.  Ask your health care provider how often you should have your eyes and teeth checked.  Stay up to date on all vaccines. This information is not intended to replace advice given to you by your health care provider. Make sure you discuss any questions you have with your health care provider. Document Revised: 11/13/2017 Document Reviewed: 11/13/2017 Elsevier Patient Education  [place].   Diabetes Mellitus and Nutrition, Adult When you have diabetes (diabetes mellitus), it is very important to have healthy eating habits because your blood sugar (glucose) levels are greatly affected by what you eat and drink. Eating healthy foods in the appropriate amounts, at about the same times every day, can help you:  Control your blood glucose.  Lower your risk of heart disease.  Improve your blood pressure.  Reach or maintain a healthy weight. Every person with diabetes is different, and each person has different needs for a meal plan. Your health care provider may recommend that you work with a diet and nutrition specialist (dietitian) to make a meal plan that is best for you. Your meal plan may vary depending on factors such as:  The calories you need.  The medicines you take.  Your weight.  Your blood glucose, blood pressure, and cholesterol levels.  Your activity level.  Other health conditions you have, such as heart or kidney disease. How do carbohydrates affect me? Carbohydrates, also called carbs, affect your blood  glucose level more than any other type of food. Eating carbs naturally raises the amount of glucose in your blood. Carb counting is a method for keeping track of how many carbs you eat. Counting carbs is important to keep your blood glucose at a healthy level, especially if you use insulin or take certain oral diabetes medicines. It is important to know how many carbs you can safely have in each meal. This is different for every person. Your dietitian can help you calculate how many carbs you should have at each meal and for each snack. Foods that contain carbs include:  Bread, cereal, rice, pasta, and crackers.  Potatoes and corn.  Peas, beans, and lentils.  Milk and yogurt.  Fruit and juice.  Desserts, such as cakes, cookies, ice cream, and candy. How does alcohol affect me? Alcohol can cause a sudden decrease in blood glucose (hypoglycemia), especially if you use insulin or take certain oral diabetes medicines. Hypoglycemia can be a life-threatening condition. Symptoms of hypoglycemia (sleepiness, dizziness, and confusion) are similar to symptoms of having too much alcohol. If your health care provider says that alcohol is safe for you, follow these guidelines:  Limit alcohol intake to no more than 1 drink per day for nonpregnant women and 2 drinks per day for men. One drink equals 12 oz of beer, 5 oz of wine, or 1 oz of hard liquor.  Do not drink on an empty stomach.  Keep yourself hydrated with water, diet soda, or unsweetened iced tea.  Keep in mind that regular soda, juice, and other mixers may contain a lot of sugar and must be counted as carbs. What are tips for following this plan?  Reading food labels  Start by checking the serving size on the "Nutrition Facts" label of packaged foods and drinks. The amount of calories, carbs, fats, and other nutrients listed on the label is based on one serving of the item.  Many items contain more than one serving per package.  Check the  total grams (g) of carbs in one serving. You can calculate the number of servings of carbs in one serving by dividing the total carbs by 15. For example, if a food has 30 g of total carbs, it would be equal to 2 servings of carbs.  Check the number of grams (g) of saturated and trans fats in one serving. Choose foods that have low or no amount of these fats.  Check the number of milligrams (mg) of salt (sodium) in one serving. Most people should limit total sodium intake to less than 2,300 mg per day.  Always check the nutrition information of foods labeled as "low-fat" or "nonfat". These foods may be higher in added sugar or refined carbs and should be avoided.  Talk to your dietitian to identify your daily goals for nutrients listed on the label. Shopping  Avoid buying canned, premade, or processed foods. These foods tend to be high in fat, sodium, and added sugar.  Shop around the outside edge of the grocery store. This includes fresh fruits and vegetables, bulk grains, fresh meats, and fresh dairy. Cooking  Use low-heat cooking methods, such as baking, instead of high-heat cooking methods like deep frying.  Cook using healthy oils, such as olive, canola, or sunflower oil.  Avoid cooking with butter, cream, or high-fat meats. Meal planning  Eat meals and snacks regularly, preferably at the same times every day. Avoid going long periods of time without eating.  Eat foods high in fiber, such as fresh fruits, vegetables, beans, and whole grains. Talk to your dietitian about how many servings of carbs you can eat at each meal.  Eat 4-6 ounces (oz) of lean protein each day, such as lean meat, chicken, fish, eggs, or tofu. One oz of lean protein is equal to: ? 1 oz of meat, chicken, or fish. ? 1 egg. ?  cup of tofu.  Eat some foods each day that contain healthy fats, such as avocado, nuts, seeds, and fish. Lifestyle  Check your blood glucose regularly.  Exercise regularly as told  by your health care provider. This may include: ? 150 minutes of moderate-intensity or vigorous-intensity exercise each week. This could be brisk walking, biking, or water aerobics. ? Stretching and doing strength exercises, such as yoga or weightlifting, at least 2 times a week.  Take medicines as told by your health care provider.  Do not use any products that contain nicotine or tobacco, such as cigarettes and e-cigarettes. If you need help quitting, ask your health care provider.  Work with a Social worker or diabetes educator to identify strategies to manage stress and any emotional and social challenges. Questions to ask a health care provider  Do I need to meet with a diabetes educator?  Do I need to meet with a dietitian?  What number can I call if I have questions?  When are the best times to check my blood glucose? Where to find more information:  American Diabetes Association: diabetes.org  Academy of Nutrition and Dietetics: www.eatright.[employer] of Diabetes and Digestive and Kidney Diseases (NIH): [employer] Summary  A healthy meal plan will help you control your blood glucose and maintain a healthy lifestyle.  Working with a diet and nutrition specialist (dietitian) can help you make a meal plan that is best for you.  Keep in mind that carbohydrates (carbs) and alcohol have immediate effects on your blood glucose levels. It is  important to count carbs and to use alcohol carefully. This information is not intended to replace advice given to you by your health care provider. Make sure you discuss any questions you have with your health care provider. Document Revised: 02/14/2017 Document Reviewed: 04/08/2016 Elsevier Patient Education  [employer].  We are committed to keeping you informed about the COVID-19 vaccine.  As the vaccine continues to become available for each phase, we will ensure that patients who meet the criteria receive the  information they need to access vaccination opportunities. Continue to check your MyChart account and [email or web address] for updates. Please review the Phase 1b information below.  Following [place]'s guidelines for the distribution of COVID-19 vaccines we are pleased to share our plans to begin offering vaccines to those 65 and older (Phase 1b). Here are details of those plans:  [place] On Tuesday, Jan. 19, the [facility]) and [place] begin large-scale COVID-19 vaccinations at the [facility]. The vaccinations are appointment only and for those 11 and older.  Walk-ins will not be accepted.  All appointments are currently filled. Please join our waiting list for the next available appointments. We will contact you when appointments become available. Please do not sign up more than once.  Join Our Waiting List   Other Vaccination Opportunities in [place] We are also working in partnership with county health agencies in our service counties to ensure continuing vaccination availability in the weeks and months ahead. Learn more about each county's vaccination efforts in the website links below:   [place]'s phase 1b vaccination guidelines, prioritizing those 65 and over as the next eligible group to receive the COVID-19 vaccine, are detailed at [employer]ch.   Vaccine Safety and Effectiveness Clinical trials for the Pfizer COVID-19 vaccine involved 42,000 people and showed that the vaccine is more than 95% effective in preventing COVID-19 with no serious safety concerns. Similar results have been reported for the Moderna COVID-19 vaccine. Side effects reported in the [place] clinical trials include a sore arm at the injection site, fatigue, headache, chills and fever. While side  effects from the [place] COVID-19 vaccine are higher than for a typical flu vaccine, they are lower in many ways than side effects from the leading vaccine to prevent shingles. Side effects are signs that a vaccine is working and are related to your immune system being stimulated to produce antibodies against infection. Side effects from vaccination are far less significant than health impacts from COVID-19.  Staying Informed Pharmacists, infectious disease doctors, critical care nurses and other experts at [facility] continue to speak publicly through media interviews and direct communication with our patients and communities about the safety, effectiveness and importance of vaccines to eliminate COVID-19. In addition, reliable information on vaccine safety, effectiveness, side effects and more is available on the following websites:  [place] Department of Health and Human Services COVID-19 Vaccine Information Website.  U.S. Centers for Disease Control and Prevention PIRJJ-88 Human resources officer.  Staying Safe We agree with the CDC on what we can do to help our communities get back to normal: Getting "back to normal" is going to take all of our tools. If we use all the tools we have, we stand the best chance of getting our families, communities, schools and workplaces "back to normal" sooner:  Get vaccinated as soon as  vaccines become available within the phase of the state's vaccination rollout plan for which you meet the eligibility criteria.  Wear a mask.  Stay 6 feet from others and avoid crowds.  Wash hands often.  For our most current information, please visit [email or web address].      Lelon Frohlich, MD [facility]

## 2019-04-06 NOTE — Patient Instructions (Signed)
-Nice seeing you today!!  -Lab work today; will notify you once results are available.  -Schedule follow up in 4 months.  -Make sure you have your eye exam.   Preventive Care 26-59 Years Old, Female Preventive care refers to visits with your health care provider and lifestyle choices that can promote health and wellness. This includes:  A yearly physical exam. This may also be called an annual well check.  Regular dental visits and eye exams.  Immunizations.  Screening for certain conditions.  Healthy lifestyle choices, such as eating a healthy diet, getting regular exercise, not using drugs or products that contain nicotine and tobacco, and limiting alcohol use. What can I expect for my preventive care visit? Physical exam Your health care provider will check your:  Height and weight. This may be used to calculate body mass index (BMI), which tells if you are at a healthy weight.  Heart rate and blood pressure.  Skin for abnormal spots. Counseling Your health care provider may ask you questions about your:  Alcohol, tobacco, and drug use.  Emotional well-being.  Home and relationship well-being.  Sexual activity.  Eating habits.  Work and work Statistician.  Method of birth control.  Menstrual cycle.  Pregnancy history. What immunizations do I need?  Influenza (flu) vaccine  This is recommended every year. Tetanus, diphtheria, and pertussis (Tdap) vaccine  You may need a Td booster every 10 years. Varicella (chickenpox) vaccine  You may need this if you have not been vaccinated. Zoster (shingles) vaccine  You may need this after age 55. Measles, mumps, and rubella (MMR) vaccine  You may need at least one dose of MMR if you were born in 1957 or later. You may also need a second dose. Pneumococcal conjugate (PCV13) vaccine  You may need this if you have certain conditions and were not previously vaccinated. Pneumococcal polysaccharide (PPSV23)  vaccine  You may need one or two doses if you smoke cigarettes or if you have certain conditions. Meningococcal conjugate (MenACWY) vaccine  You may need this if you have certain conditions. Hepatitis A vaccine  You may need this if you have certain conditions or if you travel or work in places where you may be exposed to hepatitis A. Hepatitis B vaccine  You may need this if you have certain conditions or if you travel or work in places where you may be exposed to hepatitis B. Haemophilus influenzae type b (Hib) vaccine  You may need this if you have certain conditions. Human papillomavirus (HPV) vaccine  If recommended by your health care provider, you may need three doses over 6 months. You may receive vaccines as individual doses or as more than one vaccine together in one shot (combination vaccines). Talk with your health care provider about the risks and benefits of combination vaccines. What tests do I need? Blood tests  Lipid and cholesterol levels. These may be checked every 5 years, or more frequently if you are over 60 years old.  Hepatitis C test.  Hepatitis B test. Screening  Lung cancer screening. You may have this screening every year starting at age 69 if you have a 30-pack-year history of smoking and currently smoke or have quit within the past 15 years.  Colorectal cancer screening. All adults should have this screening starting at age 29 and continuing until age 49. Your health care provider may recommend screening at age 43 if you are at increased risk. You will have tests every 1-10 years, depending on your  results and the type of screening test.  Diabetes screening. This is done by checking your blood sugar (glucose) after you have not eaten for a while (fasting). You may have this done every 1-3 years.  Mammogram. This may be done every 1-2 years. Talk with your health care provider about when you should start having regular mammograms. This may depend on  whether you have a family history of breast cancer.  BRCA-related cancer screening. This may be done if you have a family history of breast, ovarian, tubal, or peritoneal cancers.  Pelvic exam and Pap test. This may be done every 3 years starting at age 51. Starting at age 42, this may be done every 5 years if you have a Pap test in combination with an HPV test. Other tests  Sexually transmitted disease (STD) testing.  Bone density scan. This is done to screen for osteoporosis. You may have this scan if you are at high risk for osteoporosis. Follow these instructions at home: Eating and drinking  Eat a diet that includes fresh fruits and vegetables, whole grains, lean protein, and low-fat dairy.  Take vitamin and mineral supplements as recommended by your health care provider.  Do not drink alcohol if: ? Your health care provider tells you not to drink. ? You are pregnant, may be pregnant, or are planning to become pregnant.  If you drink alcohol: ? Limit how much you have to 0-1 drink a day. ? Be aware of how much alcohol is in your drink. In the [place] one drink equals one 12 oz bottle of beer (355 mL), one 5 oz glass of wine (148 mL), or one 1 oz glass of hard liquor (44 mL). Lifestyle  Take daily care of your teeth and gums.  Stay active. Exercise for at least 30 minutes on 5 or more days each week.  Do not use any products that contain nicotine or tobacco, such as cigarettes, e-cigarettes, and chewing tobacco. If you need help quitting, ask your health care provider.  If you are sexually active, practice safe sex. Use a condom or other form of birth control (contraception) in order to prevent pregnancy and STIs (sexually transmitted infections).  If told by your health care provider, take low-dose aspirin daily starting at age 23. What's next?  Visit your health care provider once a year for a well check visit.  Ask your health care provider how often you should have your  eyes and teeth checked.  Stay up to date on all vaccines. This information is not intended to replace advice given to you by your health care provider. Make sure you discuss any questions you have with your health care provider. Document Revised: 11/13/2017 Document Reviewed: 11/13/2017 Elsevier Patient Education  [place].   Diabetes Mellitus and Nutrition, Adult When you have diabetes (diabetes mellitus), it is very important to have healthy eating habits because your blood sugar (glucose) levels are greatly affected by what you eat and drink. Eating healthy foods in the appropriate amounts, at about the same times every day, can help you:  Control your blood glucose.  Lower your risk of heart disease.  Improve your blood pressure.  Reach or maintain a healthy weight. Every person with diabetes is different, and each person has different needs for a meal plan. Your health care provider may recommend that you work with a diet and nutrition specialist (dietitian) to make a meal plan that is best for you. Your meal plan may vary depending  on factors such as:  The calories you need.  The medicines you take.  Your weight.  Your blood glucose, blood pressure, and cholesterol levels.  Your activity level.  Other health conditions you have, such as heart or kidney disease. How do carbohydrates affect me? Carbohydrates, also called carbs, affect your blood glucose level more than any other type of food. Eating carbs naturally raises the amount of glucose in your blood. Carb counting is a method for keeping track of how many carbs you eat. Counting carbs is important to keep your blood glucose at a healthy level, especially if you use insulin or take certain oral diabetes medicines. It is important to know how many carbs you can safely have in each meal. This is different for every person. Your dietitian can help you calculate how many carbs you should have at each meal and for  each snack. Foods that contain carbs include:  Bread, cereal, rice, pasta, and crackers.  Potatoes and corn.  Peas, beans, and lentils.  Milk and yogurt.  Fruit and juice.  Desserts, such as cakes, cookies, ice cream, and candy. How does alcohol affect me? Alcohol can cause a sudden decrease in blood glucose (hypoglycemia), especially if you use insulin or take certain oral diabetes medicines. Hypoglycemia can be a life-threatening condition. Symptoms of hypoglycemia (sleepiness, dizziness, and confusion) are similar to symptoms of having too much alcohol. If your health care provider says that alcohol is safe for you, follow these guidelines:  Limit alcohol intake to no more than 1 drink per day for nonpregnant women and 2 drinks per day for men. One drink equals 12 oz of beer, 5 oz of wine, or 1 oz of hard liquor.  Do not drink on an empty stomach.  Keep yourself hydrated with water, diet soda, or unsweetened iced tea.  Keep in mind that regular soda, juice, and other mixers may contain a lot of sugar and must be counted as carbs. What are tips for following this plan?  Reading food labels  Start by checking the serving size on the "Nutrition Facts" label of packaged foods and drinks. The amount of calories, carbs, fats, and other nutrients listed on the label is based on one serving of the item. Many items contain more than one serving per package.  Check the total grams (g) of carbs in one serving. You can calculate the number of servings of carbs in one serving by dividing the total carbs by 15. For example, if a food has 30 g of total carbs, it would be equal to 2 servings of carbs.  Check the number of grams (g) of saturated and trans fats in one serving. Choose foods that have low or no amount of these fats.  Check the number of milligrams (mg) of salt (sodium) in one serving. Most people should limit total sodium intake to less than 2,300 mg per day.  Always check the  nutrition information of foods labeled as "low-fat" or "nonfat". These foods may be higher in added sugar or refined carbs and should be avoided.  Talk to your dietitian to identify your daily goals for nutrients listed on the label. Shopping  Avoid buying canned, premade, or processed foods. These foods tend to be high in fat, sodium, and added sugar.  Shop around the outside edge of the grocery store. This includes fresh fruits and vegetables, bulk grains, fresh meats, and fresh dairy. Cooking  Use low-heat cooking methods, such as baking, instead of high-heat cooking  methods like deep frying.  Cook using healthy oils, such as olive, canola, or sunflower oil.  Avoid cooking with butter, cream, or high-fat meats. Meal planning  Eat meals and snacks regularly, preferably at the same times every day. Avoid going long periods of time without eating.  Eat foods high in fiber, such as fresh fruits, vegetables, beans, and whole grains. Talk to your dietitian about how many servings of carbs you can eat at each meal.  Eat 4-6 ounces (oz) of lean protein each day, such as lean meat, chicken, fish, eggs, or tofu. One oz of lean protein is equal to: ? 1 oz of meat, chicken, or fish. ? 1 egg. ?  cup of tofu.  Eat some foods each day that contain healthy fats, such as avocado, nuts, seeds, and fish. Lifestyle  Check your blood glucose regularly.  Exercise regularly as told by your health care provider. This may include: ? 150 minutes of moderate-intensity or vigorous-intensity exercise each week. This could be brisk walking, biking, or water aerobics. ? Stretching and doing strength exercises, such as yoga or weightlifting, at least 2 times a week.  Take medicines as told by your health care provider.  Do not use any products that contain nicotine or tobacco, such as cigarettes and e-cigarettes. If you need help quitting, ask your health care provider.  Work with a Social worker or diabetes  educator to identify strategies to manage stress and any emotional and social challenges. Questions to ask a health care provider  Do I need to meet with a diabetes educator?  Do I need to meet with a dietitian?  What number can I call if I have questions?  When are the best times to check my blood glucose? Where to find more information:  American Diabetes Association: diabetes.org  Academy of Nutrition and Dietetics: www.eatright.[employer] of Diabetes and Digestive and Kidney Diseases (NIH): [employer] Summary  A healthy meal plan will help you control your blood glucose and maintain a healthy lifestyle.  Working with a diet and nutrition specialist (dietitian) can help you make a meal plan that is best for you.  Keep in mind that carbohydrates (carbs) and alcohol have immediate effects on your blood glucose levels. It is important to count carbs and to use alcohol carefully. This information is not intended to replace advice given to you by your health care provider. Make sure you discuss any questions you have with your health care provider. Document Revised: 02/14/2017 Document Reviewed: 04/08/2016 Elsevier Patient Education  [employer].  We are committed to keeping you informed about the COVID-19 vaccine.  As the vaccine continues to become available for each phase, we will ensure that patients who meet the criteria receive the information they need to access vaccination opportunities. Continue to check your MyChart account and [email or web address] for updates. Please review the Phase 1b information below.  Following [place]'s guidelines for the distribution of COVID-19 vaccines we are pleased to share our plans to begin offering vaccines to those 65 and older (Phase 1b). Here are details of those plans:  [place] On Tuesday, Jan. 19, the [facility])  and [place] begin large-scale COVID-19 vaccinations at the [place]. The vaccinations are appointment only and for those 39 and older.  Walk-ins will not be accepted.  All appointments are currently filled. Please join our waiting list for the next available appointments. We  will contact you when appointments become available. Please do not sign up more than once.  Join Our Waiting List   Other Vaccination Opportunities in [place] We are also working in partnership with county health agencies in our service counties to ensure continuing vaccination availability in the weeks and months ahead. Learn more about each county's vaccination efforts in the website links below:   [place]'s phase 1b vaccination guidelines, prioritizing those 65 and over as the next eligible group to receive the COVID-19 vaccine, are detailed at [email or web address].[email or web address].   Vaccine Safety and Effectiveness Clinical trials for the Pfizer COVID-19 vaccine involved 42,000 people and showed that the vaccine is more than 95% effective in preventing COVID-19 with no serious safety concerns. Similar results have been reported for the Moderna COVID-19 vaccine. Side effects reported in the [facility] clinical trials include a sore arm at the injection site, fatigue, headache, chills and fever. While side effects from the [place] COVID-19 vaccine are higher than for a typical flu vaccine, they are lower in many ways than side effects from the leading vaccine to prevent shingles. Side effects are signs that a vaccine is working and are related to your immune system being stimulated to produce antibodies against infection. Side effects from vaccination are far less significant than health impacts from COVID-19.  Staying Informed Pharmacists, infectious disease doctors, critical care nurses and other experts at [facility] continue to  speak publicly through media interviews and direct communication with our patients and communities about the safety, effectiveness and importance of vaccines to eliminate COVID-19. In addition, reliable information on vaccine safety, effectiveness, side effects and more is available on the following websites:  [place] Department of Health and Human Services COVID-19 Vaccine Information Website.  U.S. Centers for Disease Control and Prevention VFIEP-32 Human resources officer.  Staying Safe We agree with the CDC on what we can do to help our communities get back to normal: Getting back to normal is going to take all of our tools. If we use all the tools we have, we stand the best chance of getting our families, communities, schools and workplaces back to normal sooner:  Get vaccinated as soon as vaccines become available within the phase of the state's vaccination rollout plan for which you meet the eligibility criteria.  Wear a mask.  Stay 6 feet from others and avoid crowds.  Wash hands often.  For our most current information, please visit [email or web address].

## 2019-04-07 ENCOUNTER — Encounter: Payer: Self-pay | Admitting: Internal Medicine

## 2019-04-07 ENCOUNTER — Other Ambulatory Visit: Payer: Self-pay | Admitting: Internal Medicine

## 2019-04-07 DIAGNOSIS — E876 Hypokalemia: Secondary | ICD-10-CM

## 2019-04-07 DIAGNOSIS — E1169 Type 2 diabetes mellitus with other specified complication: Secondary | ICD-10-CM

## 2019-04-07 DIAGNOSIS — M25662 Stiffness of left knee, not elsewhere classified: Secondary | ICD-10-CM | POA: Diagnosis not present

## 2019-04-07 DIAGNOSIS — E785 Hyperlipidemia, unspecified: Secondary | ICD-10-CM

## 2019-04-07 DIAGNOSIS — M25562 Pain in left knee: Secondary | ICD-10-CM | POA: Diagnosis not present

## 2019-04-08 NOTE — Telephone Encounter (Signed)
Called and spoke with patient.

## 2019-04-12 DIAGNOSIS — M25562 Pain in left knee: Secondary | ICD-10-CM | POA: Diagnosis not present

## 2019-04-12 DIAGNOSIS — M25662 Stiffness of left knee, not elsewhere classified: Secondary | ICD-10-CM | POA: Diagnosis not present

## 2019-04-14 DIAGNOSIS — M25562 Pain in left knee: Secondary | ICD-10-CM | POA: Diagnosis not present

## 2019-04-14 DIAGNOSIS — M25662 Stiffness of left knee, not elsewhere classified: Secondary | ICD-10-CM | POA: Diagnosis not present

## 2019-04-15 ENCOUNTER — Other Ambulatory Visit: Payer: Self-pay | Admitting: Internal Medicine

## 2019-04-15 DIAGNOSIS — J4530 Mild persistent asthma, uncomplicated: Secondary | ICD-10-CM

## 2019-04-15 MED ORDER — MONTELUKAST SODIUM 10 MG PO TABS: 10.0000 mg | ORAL_TABLET | Freq: Every day | ORAL | 11 refills | Status: DC

## 2019-04-19 DIAGNOSIS — M25662 Stiffness of left knee, not elsewhere classified: Secondary | ICD-10-CM | POA: Diagnosis not present

## 2019-04-19 DIAGNOSIS — M25562 Pain in left knee: Secondary | ICD-10-CM | POA: Diagnosis not present

## 2019-04-21 ENCOUNTER — Encounter: Payer: Self-pay | Admitting: Internal Medicine

## 2019-04-21 DIAGNOSIS — Z124 Encounter for screening for malignant neoplasm of cervix: Secondary | ICD-10-CM

## 2019-04-21 DIAGNOSIS — M25662 Stiffness of left knee, not elsewhere classified: Secondary | ICD-10-CM | POA: Diagnosis not present

## 2019-04-27 DIAGNOSIS — U071 COVID-19: Secondary | ICD-10-CM

## 2019-04-27 NOTE — Telephone Encounter (Signed)
Good for at least 3 months Can come in and get the Covid IgG if wants assurance but rx is same: 6 ft, mask, wash hands even after get the vaccine

## 2019-04-27 NOTE — Telephone Encounter (Signed)
Dr Melvyn Novas- please advise on pt email, thanks!  Good Morning,   After having COVID in November, at what point I'm I at risk of getting it again?  I think it will be awhile before I can get the vaccine.  I'm back in the office where every day someone is testing positive.

## 2019-04-28 DIAGNOSIS — Z96652 Presence of left artificial knee joint: Secondary | ICD-10-CM | POA: Diagnosis not present

## 2019-04-29 ENCOUNTER — Other Ambulatory Visit: Payer: Self-pay

## 2019-04-29 ENCOUNTER — Ambulatory Visit (INDEPENDENT_AMBULATORY_CARE_PROVIDER_SITE_OTHER): Payer: BC Managed Care – PPO | Admitting: Neurology

## 2019-04-29 ENCOUNTER — Encounter: Payer: Self-pay | Admitting: Neurology

## 2019-04-29 VITALS — BP 151/83 | HR 96 | Temp 97.6°F | Ht 63.0 in | Wt 203.5 lb

## 2019-04-29 DIAGNOSIS — M791 Myalgia, unspecified site: Secondary | ICD-10-CM | POA: Diagnosis not present

## 2019-04-29 MED ORDER — GABAPENTIN 100 MG PO CAPS: 100.0000 mg | ORAL_CAPSULE | Freq: Three times a day (TID) | ORAL | 6 refills | Status: DC

## 2019-04-29 NOTE — Progress Notes (Signed)
PATIENT: Carrie [name] DOB: 12-01-1960  REASON FOR VISIT: follow up HISTORY FROM: patient  HISTORY OF PRESENT ILLNESS: Today 04/29/19  HISTORY  HISTORY  Carrie [name] is a 59 year old female, seen in refer by his primary care doctor Stevie Kern for evaluation of numbness, initial evaluation was January 15 2017.  I reviewed and summarized the referring note, she has history of ADHD, atrial fibrillation, diabetes, fibromyalgia, asthma.  Because of her chronic asthma, breathing problem, she had been treated with prednisone for many years, recently tapered off prednisone 20 mg every day in July 2018, shortly after she stopped prednisone treatment, she noticed right shoulder deep achy pain, later spreading to bilateral shoulder, arm, anterior thigh, deep achy pain, numbness tingling, could not sleep, feel muscle strain, bilateral hip weakness, difficulty standing up,  She was put back on prednisone in October 2018 now taking 5 mg alternating with 10 mg, shortly afterwards, her symptoms disappeared, she is also taking Flexeril 10 mg at bedtime, which is helpful.  Laboratory evaluation March 2018, normal CMP, CBC, mild elevated LDL 146, A1c 6.1, TSH.   UPDATEMay 16, 2019 Laboratory evaluation showed positive ANA, otherwise normal ferritin, CPK, vitamin D, B12, ESR C-reactive protein,negative auto antibodies  She was treated with Cymbalta since October 2018, responding well, rarely have any deep muscle achy pain, she was also able to successfully taper off the prednisone. She denies significant muscle weakness  UPDATE Apr 29 2019: She is doing very well, only has intermittent muscle achy pain, had left knee replacement November 2020, recovered well, she suffered COVID-19 on Feb 04 2019, lost the smell, taste, generalized fatigue, overall has improved  She is currently taking gabapentin 600 mg in the morning, 300 at noon, 600 at night, no longer taking Cymbalta, hope  to continue to decrease gabapentin    REVIEW OF SYSTEMS: Out of a complete 14 system review of symptoms, the patient complains only of the following symptoms, and all other reviewed systems are negative.  As above  ALLERGIES: Allergies  Allergen Reactions  . Aspirin     REACTION: asthma- on respirator for 28 days  . Codeine Phosphate Hives  . Influenza Vaccines     Soreness, increased asthma, side pain, trouble walking  . Penicillins Hives    REACTION: wheezing and hives  Did it involve swelling of the face/tongue/throat, SOB, or low BP? no Did it involve sudden or severe rash/hives, skin peeling, or any reaction on the inside of your mouth or nose? No Did you need to seek medical attention at a hospital or doctor's office? no When did it last happen?Childhood If all above answers are "NO", may proceed with cephalosporin use.    HOME MEDICATIONS: Outpatient Medications Prior to Visit  Medication Sig Dispense Refill  . acetaminophen (TYLENOL) 325 MG tablet Take 650 mg by mouth every 6 (six) hours as needed for mild pain or headache.    . albuterol (PROVENTIL) (2.5 MG/3ML) 0.083% nebulizer solution USE 1 VIAL VIA NEBULIZER EVERY 4 HOURS AS NEEDED FOR WHEEZE/SHORTNESS OF BREATH (Patient taking differently: Take 2.5 mg by nebulization every 4 (four) hours as needed for wheezing or shortness of breath. ) 75 mL 6  . amphetamine-dextroamphetamine (ADDERALL XR) 20 MG 24 hr capsule Take 1 capsule (20 mg total) by mouth 2 (two) times daily. 60 capsule 0  . atorvastatin (LIPITOR) 20 MG tablet Take 1 tablet (20 mg total) by mouth daily. 90 tablet 1  . budesonide-formoterol (SYMBICORT) 160-4.5 MCG/ACT inhaler Inhale  2 puffs into the lungs 2 (two) times daily. 1 Inhaler 0  . gabapentin (NEURONTIN) 300 MG capsule Take 2 in the morning, 1 midday, take 2 in the evening (Patient taking differently: Take 300 mg by mouth See admin instructions. Take 600 mg  in the morning, 300 mg midday, take  600 mg in the evening) 150 capsule 11  . levothyroxine (SYNTHROID) 75 MCG tablet Take 75 mcg by mouth daily before breakfast.    . montelukast (SINGULAIR) 10 MG tablet Take 1 tablet (10 mg total) by mouth at bedtime. 30 tablet 11  . pantoprazole (PROTONIX) 40 MG tablet Take 30-60 min before first meal of the day (Patient taking differently: Take 40 mg by mouth daily before breakfast. Take 30-60 min before first meal of the day) 90 tablet 11  . PROAIR HFA 108 (90 Base) MCG/ACT inhaler USE 2 INHALATIONS EVERY 6 HOURS AS NEEDED (Patient taking differently: Inhale 2 puffs into the lungs every 6 (six) hours as needed for shortness of breath. ) 25.5 g 3  . Vitamin D, Ergocalciferol, (DRISDOL) 1.25 MG (50000 UNIT) CAPS capsule Take 1 capsule (50,000 Units total) by mouth every 7 (seven) days for 12 doses. 12 capsule 0  . DULoxetine (CYMBALTA) 60 MG capsule Take 1 capsule (60 mg total) by mouth daily. (Patient taking differently: Take 60 mg by mouth daily. ) 90 capsule 1  . methocarbamol (ROBAXIN) 500 MG tablet Take 1 tablet (500 mg total) by mouth every 6 (six) hours as needed for muscle spasms. 40 tablet 0  . potassium chloride SA (KLOR-CON) 20 MEQ tablet Take 1 tablet (20 mEq total) by mouth daily for 3 days. 3 tablet 0  . tolterodine (DETROL LA) 4 MG 24 hr capsule 2 capsules daily 180 capsule 1   No facility-administered medications prior to visit.    PAST MEDICAL HISTORY: Past Medical History:  Diagnosis Date  . A-fib ([place])   . ADD (attention deficit disorder)   . Adrenal abnormality ([place])   . Anemia, iron deficiency   . Asthma   . Diabetes mellitus without complication ([place])   . Fibromyalgia   . Hiatal hernia   . Numbness and tingling   . Obese   . Thyroid disease     PAST SURGICAL HISTORY: Past Surgical History:  Procedure Laterality Date  . ABDOMINAL HYSTERECTOMY    . KNEE SURGERY    . SHOULDER SURGERY    . TOTAL KNEE ARTHROPLASTY Left 01/15/2019   Procedure: TOTAL KNEE  ARTHROPLASTY;  Surgeon: Sydnee Cabal, MD;  Location: [facility];  Service: Orthopedics;  Laterality: Left;    FAMILY HISTORY: Family History  Problem Relation Age of Onset  . Asthma Mother        seasonal  . Colon cancer Mother   . Lung cancer Maternal Uncle   . Esophageal cancer Father        smoker, mets to lungs  . Diabetes Sister     SOCIAL HISTORY: Social History   Socioeconomic History  . Marital status: Married    Spouse name: Not on file  . Number of children: 4  . Years of education: College  . Highest education level: Not on file  Occupational History  . Occupation: human resources  Tobacco Use  . Smoking status: Never Smoker  . Smokeless tobacco: Never Used  Substance and Sexual Activity  . Alcohol use: Yes    Comment: occ  . Drug use: No  . Sexual activity: Not on file  Other Topics  Concern  . Not on file  Social History Narrative   Lives at home with husband.   Right-handed.   1 cup caffeine per day.   Social Determinants of Health   Financial Resource Strain:   . Difficulty of Paying Living Expenses: Not on file  Food Insecurity:   . Worried About Charity fundraiser in the Last Year: Not on file  . Ran Out of Food in the Last Year: Not on file  Transportation Needs:   . Lack of Transportation (Medical): Not on file  . Lack of Transportation (Non-Medical): Not on file  Physical Activity:   . Days of Exercise per Week: Not on file  . Minutes of Exercise per Session: Not on file  Stress:   . Feeling of Stress : Not on file  Social Connections:   . Frequency of Communication with Friends and Family: Not on file  . Frequency of Social Gatherings with Friends and Family: Not on file  . Attends Religious Services: Not on file  . Active Member of Clubs or Organizations: Not on file  . Attends Archivist Meetings: Not on file  . Marital Status: Not on file  Intimate Partner Violence:   . Fear of Current or Ex-Partner: Not on file  .  Emotionally Abused: Not on file  . Physically Abused: Not on file  . Sexually Abused: Not on file    PHYSICAL EXAM  Vitals:   04/29/19 0723  BP: (!) 151/83  Pulse: 96  Temp: 97.6 F (36.4 C)  Weight: 203 lb 8 oz (92.3 kg)  Height: '5\' 3"'  (1.6 m)   Body mass index is 36.05 kg/m.   PHYSICAL EXAMNIATION:  Gen: NAD, conversant, well nourised, well groomed                     Cardiovascular: Regular rate rhythm, no peripheral edema, warm, nontender. Eyes: Conjunctivae clear without exudates or hemorrhage Neck: Supple, no carotid bruits. Pulmonary: Clear to auscultation bilaterally   NEUROLOGICAL EXAM:  MENTAL STATUS: Speech:    Speech is normal; fluent and spontaneous with normal comprehension.  Cognition:     Orientation to time, place and person     Normal recent and remote memory     Normal Attention span and concentration     Normal Language, naming, repeating,spontaneous speech     Fund of knowledge   CRANIAL NERVES: CN II: Visual fields are full to confrontation.  Pupils are round equal and briskly reactive to light. CN III, IV, VI: extraocular movement are normal. No ptosis. CN V: Facial sensation is intact to pinprick in all 3 divisions bilaterally. Corneal responses are intact.  CN VII: Face is symmetric with normal eye closure and smile. CN VIII: Hearing is normal to casual conversation CN IX, X: Palate elevates symmetrically. Phonation is normal. CN XI: Head turning and shoulder shrug are intact CN XII: Tongue is midline with normal movements and no atrophy.  MOTOR: There is no pronator drift of out-stretched arms. Muscle bulk and tone are normal. Muscle strength is normal.  REFLEXES: Reflexes are 2+ and symmetric at the biceps, triceps, knees, and ankles. Plantar responses are flexor.  SENSORY: Intact to light touch, pinprick, positional and vibratory sensation are intact in fingers and toes.  COORDINATION: Rapid alternating movements and fine finger  movements are intact. There is no dysmetria on finger-to-nose and heel-knee-shin.    GAIT/STANCE: Able to get up from seated position arms crossed   DIAGNOSTIC  DATA (LABS, IMAGING, TESTING) - I reviewed patient records, labs, notes, testing and imaging myself where available.  Lab Results  Component Value Date   WBC 3.8 (L) 04/06/2019   HGB 12.5 04/06/2019   HCT 38.6 04/06/2019   MCV 87.1 04/06/2019   PLT 293.0 04/06/2019      Component Value Date/Time   NA 142 04/06/2019 0831   NA 142 04/09/2016 0000   K 3.4 (L) 04/06/2019 0831   CL 104 04/06/2019 0831   CO2 30 04/06/2019 0831   GLUCOSE 117 (H) 04/06/2019 0831   BUN 7 04/06/2019 0831   BUN 9 04/09/2016 0000   CREATININE 0.58 04/06/2019 0831   CALCIUM 9.5 04/06/2019 0831   PROT 6.6 04/06/2019 0831   ALBUMIN 4.1 04/06/2019 0831   AST 15 04/06/2019 0831   ALT 14 04/06/2019 0831   ALKPHOS 96 04/06/2019 0831   BILITOT 0.4 04/06/2019 0831   GFRNONAA >60 02/04/2019 0851   GFRAA >60 02/04/2019 0851   Lab Results  Component Value Date   CHOL 220 (H) 04/06/2019   HDL 48.10 04/06/2019   LDLCALC 138 (H) 04/06/2019   LDLDIRECT 143.6 04/17/2006   TRIG 169.0 (H) 04/06/2019   CHOLHDL 5 04/06/2019   Lab Results  Component Value Date   HGBA1C 5.7 04/06/2019   Lab Results  Component Value Date   VITAMINB12 478 04/06/2019   Lab Results  Component Value Date   TSH 1.86 04/06/2019    ASSESSMENT AND PLAN 59 y.o. year old female   Diffuse body achy pain,  Overall has improved,  Previous EMG nerve conduction study in November 2018 showed no evidence of myopathy,  It is okay to continue tapering down gabapentin to 100 mg 3 times a day, even stopping it if needed,    Marcial Pacas, M.D. Ph.D.  [facility] [place], [place] [address] Phone: [phone] Fax:      [phone]

## 2019-05-03 DIAGNOSIS — M25662 Stiffness of left knee, not elsewhere classified: Secondary | ICD-10-CM | POA: Diagnosis not present

## 2019-05-12 DIAGNOSIS — Z96652 Presence of left artificial knee joint: Secondary | ICD-10-CM | POA: Diagnosis not present

## 2019-05-12 DIAGNOSIS — Z471 Aftercare following joint replacement surgery: Secondary | ICD-10-CM | POA: Diagnosis not present

## 2019-05-17 ENCOUNTER — Other Ambulatory Visit: Payer: Self-pay

## 2019-05-17 MED ORDER — PANTOPRAZOLE SODIUM 40 MG PO TBEC: DELAYED_RELEASE_TABLET | ORAL | 11 refills | Status: DC

## 2019-05-20 ENCOUNTER — Encounter: Payer: Self-pay | Admitting: Internal Medicine

## 2019-05-20 ENCOUNTER — Telehealth: Payer: Self-pay | Admitting: Internal Medicine

## 2019-05-20 DIAGNOSIS — F988 Other specified behavioral and emotional disorders with onset usually occurring in childhood and adolescence: Secondary | ICD-10-CM

## 2019-05-20 MED ORDER — AMPHETAMINE-DEXTROAMPHET ER 20 MG PO CP24: 20.0000 mg | ORAL_CAPSULE | ORAL | 0 refills | Status: DC

## 2019-05-20 MED ORDER — AMPHETAMINE-DEXTROAMPHET ER 20 MG PO CP24: 20.0000 mg | ORAL_CAPSULE | Freq: Two times a day (BID) | ORAL | 0 refills | Status: DC

## 2019-05-20 NOTE — Telephone Encounter (Signed)
Spoke with the pharmacist and 1 prescription is correct.  2 prescriptions need different directions - change 1 a day to 2 a day.  Pharmacist will fill the first prescription.

## 2019-05-20 NOTE — Telephone Encounter (Signed)
[facility] needs clarity on the directions for the Adderall prescription that was sent in.   Phone: [phone]

## 2019-05-21 MED ORDER — AMPHETAMINE-DEXTROAMPHET ER 20 MG PO CP24: 20.0000 mg | ORAL_CAPSULE | Freq: Two times a day (BID) | ORAL | 0 refills | Status: DC

## 2019-05-22 ENCOUNTER — Ambulatory Visit: Payer: BC Managed Care – PPO | Attending: Internal Medicine

## 2019-05-22 DIAGNOSIS — Z23 Encounter for immunization: Secondary | ICD-10-CM | POA: Insufficient documentation

## 2019-05-22 NOTE — Progress Notes (Signed)
   Covid-19 Vaccination Clinic  Name:  Carrie [name]    MRN: [id number] DOB: [birth year]  05/22/2019  [name] was observed post Covid-19 immunization for 30 minutes based on pre-vaccination screening without incident. She was provided with Vaccine Information Sheet and instruction to access the V-Safe system.   [name] was instructed to call 911 with any severe reactions post vaccine: Marland Kitchen Difficulty breathing  . Swelling of face and throat  . A fast heartbeat  . A bad rash all over body  . Dizziness and weakness   Immunizations Administered    Name Date Dose VIS Date Route   Pfizer COVID-19 Vaccine 05/22/2019  9:24 AM 0.3 mL 02/26/2019 Intramuscular   Manufacturer: [place]   Lot: [id number]   [place]: [id number]

## 2019-05-26 ENCOUNTER — Encounter: Payer: Self-pay | Admitting: *Deleted

## 2019-06-04 ENCOUNTER — Other Ambulatory Visit: Payer: Self-pay

## 2019-06-07 ENCOUNTER — Other Ambulatory Visit: Payer: Self-pay

## 2019-06-07 ENCOUNTER — Other Ambulatory Visit (INDEPENDENT_AMBULATORY_CARE_PROVIDER_SITE_OTHER): Payer: BC Managed Care – PPO

## 2019-06-07 DIAGNOSIS — E1169 Type 2 diabetes mellitus with other specified complication: Secondary | ICD-10-CM

## 2019-06-07 DIAGNOSIS — E559 Vitamin D deficiency, unspecified: Secondary | ICD-10-CM | POA: Diagnosis not present

## 2019-06-07 DIAGNOSIS — E785 Hyperlipidemia, unspecified: Secondary | ICD-10-CM | POA: Diagnosis not present

## 2019-06-07 LAB — LIPID PANEL
Cholesterol: 142 mg/dL (ref 0–200)
HDL: 46.1 mg/dL
LDL Cholesterol: 79 mg/dL (ref 0–99)
NonHDL: 96.26
Total CHOL/HDL Ratio: 3
Triglycerides: 86 mg/dL (ref 0.0–149.0)
VLDL: 17.2 mg/dL (ref 0.0–40.0)

## 2019-06-07 LAB — VITAMIN D 25 HYDROXY (VIT D DEFICIENCY, FRACTURES): VITD: 50.17 ng/mL (ref 30.00–100.00)

## 2019-06-09 ENCOUNTER — Encounter: Payer: Self-pay | Admitting: Internal Medicine

## 2019-06-09 ENCOUNTER — Other Ambulatory Visit: Payer: Self-pay | Admitting: Internal Medicine

## 2019-06-09 DIAGNOSIS — E1169 Type 2 diabetes mellitus with other specified complication: Secondary | ICD-10-CM

## 2019-06-09 DIAGNOSIS — R899 Unspecified abnormal finding in specimens from other organs, systems and tissues: Secondary | ICD-10-CM

## 2019-06-12 ENCOUNTER — Ambulatory Visit: Payer: BC Managed Care – PPO | Attending: Internal Medicine

## 2019-06-12 DIAGNOSIS — Z23 Encounter for immunization: Secondary | ICD-10-CM

## 2019-06-12 NOTE — Progress Notes (Signed)
   Covid-19 Vaccination Clinic  Name:  Carrie [name]    MRN: [id number] DOB: [birth year]  06/12/2019  [name] was observed post Covid-19 immunization for 30 minutes based on pre-vaccination screening without incident. She was provided with Vaccine Information Sheet and instruction to access the V-Safe system.   [name] was instructed to call 911 with any severe reactions post vaccine: Marland Kitchen Difficulty breathing  . Swelling of face and throat  . A fast heartbeat  . A bad rash all over body  . Dizziness and weakness   Immunizations Administered    Name Date Dose VIS Date Route   Pfizer COVID-19 Vaccine 06/12/2019  9:18 AM 0.3 mL 02/26/2019 Intramuscular   Manufacturer: [place]   Lot: [id number]   [place]: [id number]

## 2019-06-14 ENCOUNTER — Other Ambulatory Visit: Payer: Self-pay

## 2019-06-15 ENCOUNTER — Other Ambulatory Visit (INDEPENDENT_AMBULATORY_CARE_PROVIDER_SITE_OTHER): Payer: Self-pay

## 2019-06-15 DIAGNOSIS — R899 Unspecified abnormal finding in specimens from other organs, systems and tissues: Secondary | ICD-10-CM

## 2019-06-15 DIAGNOSIS — E1169 Type 2 diabetes mellitus with other specified complication: Secondary | ICD-10-CM

## 2019-06-15 DIAGNOSIS — E785 Hyperlipidemia, unspecified: Secondary | ICD-10-CM

## 2019-06-15 LAB — COMPREHENSIVE METABOLIC PANEL
ALT: 31 U/L (ref 0–35)
AST: 21 U/L (ref 0–37)
Albumin: 4 g/dL (ref 3.5–5.2)
Alkaline Phosphatase: 133 U/L — ABNORMAL HIGH (ref 39–117)
BUN: 14 mg/dL (ref 6–23)
CO2: 33 mEq/L — ABNORMAL HIGH (ref 19–32)
Calcium: 9.5 mg/dL (ref 8.4–10.5)
Chloride: 102 mEq/L (ref 96–112)
Creatinine, Ser: 0.55 mg/dL (ref 0.40–1.20)
GFR: 136.78 mL/min (ref >60.00)
Glucose, Bld: 112 mg/dL — ABNORMAL HIGH (ref 70–99)
Potassium: 4.4 mEq/L (ref 3.5–5.1)
Sodium: 140 mEq/L (ref 135–145)
Total Bilirubin: 0.5 mg/dL (ref 0.2–1.2)
Total Protein: 6.5 g/dL (ref 6.0–8.3)

## 2019-06-16 ENCOUNTER — Encounter: Payer: Self-pay | Admitting: Internal Medicine

## 2019-06-22 ENCOUNTER — Ambulatory Visit: Payer: BC Managed Care – PPO

## 2019-06-27 ENCOUNTER — Other Ambulatory Visit: Payer: Self-pay | Admitting: Internal Medicine

## 2019-06-27 DIAGNOSIS — E559 Vitamin D deficiency, unspecified: Secondary | ICD-10-CM

## 2019-07-06 ENCOUNTER — Other Ambulatory Visit: Payer: Self-pay

## 2019-07-07 ENCOUNTER — Encounter: Payer: Self-pay | Admitting: Internal Medicine

## 2019-07-07 ENCOUNTER — Ambulatory Visit (INDEPENDENT_AMBULATORY_CARE_PROVIDER_SITE_OTHER): Payer: BC Managed Care – PPO | Admitting: Internal Medicine

## 2019-07-07 VITALS — BP 110/70 | HR 85 | Temp 97.6°F | Ht 63.0 in | Wt 200.2 lb

## 2019-07-07 DIAGNOSIS — G4700 Insomnia, unspecified: Secondary | ICD-10-CM

## 2019-07-07 DIAGNOSIS — F988 Other specified behavioral and emotional disorders with onset usually occurring in childhood and adolescence: Secondary | ICD-10-CM

## 2019-07-07 DIAGNOSIS — E559 Vitamin D deficiency, unspecified: Secondary | ICD-10-CM | POA: Diagnosis not present

## 2019-07-07 MED ORDER — AMPHETAMINE-DEXTROAMPHET ER 20 MG PO CP24: 20.0000 mg | ORAL_CAPSULE | Freq: Two times a day (BID) | ORAL | 0 refills | Status: DC

## 2019-07-07 MED ORDER — AMPHETAMINE-DEXTROAMPHET ER 20 MG PO CP24: 20.0000 mg | ORAL_CAPSULE | ORAL | 0 refills | Status: DC

## 2019-07-07 NOTE — Progress Notes (Signed)
Established Patient Office Visit     This visit occurred during the SARS-CoV-2 public health emergency.  Safety protocols were in place, including screening questions prior to the visit, additional usage of staff PPE, and extensive cleaning of exam room while observing appropriate contact time as indicated for disinfecting solutions.    CC/Reason for Visit: Medication refills, follow-up fatigue  HPI: Carrie [name] is a 59 y.o. female who is coming in today for the above mentioned reasons. Past Medical History is significant for: Type 2 diabetes off all diabetic medication at the instruction of her endocrinologist, hyperlipidemia off statin due to transaminitis.  She was diagnosed with Covid late last year, ever since then has noticed an increase in fatigue and insomnia.  She was diagnosed with vitamin D deficiency and took high-dose supplementation but her repeat levels were improved and she is now on OTC supplements.  She also had a left knee replacement and had some difficulties with her physical therapy due to her Covid diagnosis but she is improving.  She has a history of ADHD and is due for Adderall refills today.   Past Medical/Surgical History: Past Medical History:  Diagnosis Date  . A-fib ([place])   . ADD (attention deficit disorder)   . Adrenal abnormality ([place])   . Anemia, iron deficiency   . Asthma   . Diabetes mellitus without complication ([place])   . Fibromyalgia   . Hiatal hernia   . Numbness and tingling   . Obese   . Thyroid disease     Past Surgical History:  Procedure Laterality Date  . ABDOMINAL HYSTERECTOMY    . KNEE SURGERY    . SHOULDER SURGERY    . TOTAL KNEE ARTHROPLASTY Left 01/15/2019   Procedure: TOTAL KNEE ARTHROPLASTY;  Surgeon: Sydnee Cabal, MD;  Location: [facility];  Service: Orthopedics;  Laterality: Left;    Social History:  reports that she has never smoked. She has never used smokeless tobacco. She reports current alcohol use. She  reports that she does not use drugs.  Allergies: Allergies  Allergen Reactions  . Aspirin     REACTION: asthma- on respirator for 28 days  . Codeine Phosphate Hives  . Influenza Vaccines     Soreness, increased asthma, side pain, trouble walking  . Penicillins Hives    REACTION: wheezing and hives  Did it involve swelling of the face/tongue/throat, SOB, or low BP? no Did it involve sudden or severe rash/hives, skin peeling, or any reaction on the inside of your mouth or nose? No Did you need to seek medical attention at a hospital or doctor's office? no When did it last happen?Childhood If all above answers are "NO", may proceed with cephalosporin use.    Family History:  Family History  Problem Relation Age of Onset  . Asthma Mother        seasonal  . Colon cancer Mother   . Lung cancer Maternal Uncle   . Esophageal cancer Father        smoker, mets to lungs  . Diabetes Sister      Current Outpatient Medications:  .  albuterol (PROVENTIL) (2.5 MG/3ML) 0.083% nebulizer solution, USE 1 VIAL VIA NEBULIZER EVERY 4 HOURS AS NEEDED FOR WHEEZE/SHORTNESS OF BREATH (Patient taking differently: Take 2.5 mg by nebulization every 4 (four) hours as needed for wheezing or shortness of breath. ), Disp: 75 mL, Rfl: 6 .  amphetamine-dextroamphetamine (ADDERALL XR) 20 MG 24 hr capsule, Take 1 capsule (20 mg total)  by mouth every morning., Disp: 60 capsule, Rfl: 0 .  amphetamine-dextroamphetamine (ADDERALL XR) 20 MG 24 hr capsule, Take 1 capsule (20 mg total) by mouth 2 (two) times daily., Disp: 60 capsule, Rfl: 0 .  amphetamine-dextroamphetamine (ADDERALL XR) 20 MG 24 hr capsule, Take 1 capsule (20 mg total) by mouth 2 (two) times daily., Disp: 60 capsule, Rfl: 0 .  atorvastatin (LIPITOR) 20 MG tablet, Take 1 tablet (20 mg total) by mouth daily., Disp: 90 tablet, Rfl: 1 .  budesonide-formoterol (SYMBICORT) 160-4.5 MCG/ACT inhaler, Inhale 2 puffs into the lungs 2 (two) times daily., Disp:  1 Inhaler, Rfl: 0 .  levothyroxine (SYNTHROID) 75 MCG tablet, Take 75 mcg by mouth daily before breakfast., Disp: , Rfl:  .  montelukast (SINGULAIR) 10 MG tablet, Take 1 tablet (10 mg total) by mouth at bedtime., Disp: 30 tablet, Rfl: 11 .  pantoprazole (PROTONIX) 40 MG tablet, Take 30-60 min before first meal of the day, Disp: 90 tablet, Rfl: 11 .  PROAIR HFA 108 (90 Base) MCG/ACT inhaler, USE 2 INHALATIONS EVERY 6 HOURS AS NEEDED (Patient taking differently: Inhale 2 puffs into the lungs every 6 (six) hours as needed for shortness of breath. ), Disp: 25.5 g, Rfl: 3  Review of Systems:  Constitutional: Denies fever, chills, diaphoresis, appetite change and fatigue.  HEENT: Denies photophobia, eye pain, redness, hearing loss, ear pain, congestion, sore throat, rhinorrhea, sneezing, mouth sores, trouble swallowing, neck pain, neck stiffness and tinnitus.   Respiratory: Denies SOB, DOE, cough, chest tightness,  and wheezing.   Cardiovascular: Denies chest pain, palpitations and leg swelling.  Gastrointestinal: Denies nausea, vomiting, abdominal pain, diarrhea, constipation, blood in stool and abdominal distention.  Genitourinary: Denies dysuria, urgency, frequency, hematuria, flank pain and difficulty urinating.  Endocrine: Denies: hot or cold intolerance, sweats, changes in hair or nails, polyuria, polydipsia. Musculoskeletal: Denies myalgias, back pain, joint swelling, arthralgias and gait problem.  Skin: Denies pallor, rash and wound.  Neurological: Denies dizziness, seizures, syncope, weakness, light-headedness, numbness and headaches.  Hematological: Denies adenopathy. Easy bruising, personal or family bleeding history  Psychiatric/Behavioral: Denies suicidal ideation,  confusion, nervousness and agitation    Physical Exam: Vitals:   07/07/19 0658  BP: 110/70  Pulse: 85  Temp: 97.6 F (36.4 C)  TempSrc: Temporal  SpO2: 97%  Weight: 200 lb 3.2 oz (90.8 kg)  Height: 5\' 3"  (1.6 m)     Body mass index is 35.46 kg/m.   Constitutional: NAD, calm, comfortable Eyes: PERRL, lids and conjunctivae normal ENMT: Mucous membranes are moist.  Respiratory: clear to auscultation bilaterally, no wheezing, no crackles. Normal respiratory effort. No accessory muscle use.  Cardiovascular: Regular rate and rhythm, no murmurs / rubs / gallops. No extremity edema.   Abdomen: no tenderness, no masses palpated. No hepatosplenomegaly. Bowel sounds positive.  Musculoskeletal: no clubbing / cyanosis. No joint deformity upper and lower extremities. Good ROM, no contractures. Normal muscle tone.  Neurologic: Grossly intact and nonfocal Psychiatric: Normal judgment and insight. Alert and oriented x 3. Normal mood.    Impression and Plan:  Attention deficit disorder, unspecified hyperactivity presence  -PDMP reviewed, overdose risk score is 190, no red flags. -Refilled Adderall 20 mg daily x3 months.  Vitamin D deficiency -She has completed high-dose supplementation.  Is not over-the-counter, recheck vitamin D levels in 3 to 6 months.  Insomnia, unspecified type -I wonder if her fatigue and insomnia might be due to post Covid sequela. -However I think depression is also a consideration.  She has scored  11 on PHQ-9 today. -I told her that we will continue to monitor and address at next visit in 3 months and determine at that time if an antidepressant might be a good idea to start.  PHQ9 SCORE ONLY 07/07/2019 04/06/2019 02/26/2018  Score 11 2 0      Patient Instructions  -Nice seeing you today!!  -See you back in 3 months.     Lelon Frohlich, MD [facility]

## 2019-07-07 NOTE — Patient Instructions (Signed)
-  Nice seeing you today!!  -See you back in 3 months. 

## 2019-07-13 ENCOUNTER — Other Ambulatory Visit: Payer: Self-pay | Admitting: *Deleted

## 2019-07-13 ENCOUNTER — Ambulatory Visit: Payer: BC Managed Care – PPO

## 2019-07-13 DIAGNOSIS — F988 Other specified behavioral and emotional disorders with onset usually occurring in childhood and adolescence: Secondary | ICD-10-CM

## 2019-07-13 MED ORDER — AMPHETAMINE-DEXTROAMPHET ER 20 MG PO CP24: ORAL_CAPSULE | ORAL | 0 refills | Status: DC

## 2019-07-13 NOTE — Telephone Encounter (Signed)
[facility]   1 prescription for amphetamine-dextroamphetamine (ADDERALL XR) 20 MG 24 hr capsule  Directions take 1 cap a day #60  Directions should be  Take 2 caps a day #60  Refill pending

## 2019-07-16 ENCOUNTER — Encounter: Payer: Self-pay | Admitting: Internal Medicine

## 2019-07-16 NOTE — Telephone Encounter (Signed)
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  My question is would hazardous is black mold and should I ask for a change in my office or work location? If the mold is found in several places in the building can it also be in other areas that has not been discovered.  I am terrified of the thought that I have been breathing this. I have been using my rescue inhaler more but I thought it was because of spring allergies. I just moved to this office a month ago.  Thank you in advance for your response.  Carrie [name] 06/27/2060"  She has attached a picture of what appears to be a bathroom wall at her job that is covered in black mold.   MW, please advise. Thanks!

## 2019-07-16 NOTE — Telephone Encounter (Signed)
She should not be on same floor as air is typically recirculated   Letter should read:   This patient  is under my care in the division of pulmonary medicine for chronic asthma which has historically been difficult to control.  She  requires high doses of asthma medications and yet still has persistent elevation of FENO, which indicates ongoing allergen exposures and ongoing reaction to allergens in her airways.   In this setting, she should not be exposed to any obvious allergens such as mold in any form.  This will require  her to be in an isolated area that does not share circulation with any parts of a building with known mold infestation.

## 2019-07-19 ENCOUNTER — Encounter: Payer: Self-pay | Admitting: *Deleted

## 2019-07-27 ENCOUNTER — Encounter: Payer: Self-pay | Admitting: Internal Medicine

## 2019-08-06 DIAGNOSIS — E039 Hypothyroidism, unspecified: Secondary | ICD-10-CM | POA: Diagnosis not present

## 2019-08-06 DIAGNOSIS — E119 Type 2 diabetes mellitus without complications: Secondary | ICD-10-CM | POA: Diagnosis not present

## 2019-08-06 DIAGNOSIS — Z1321 Encounter for screening for nutritional disorder: Secondary | ICD-10-CM | POA: Diagnosis not present

## 2019-08-13 DIAGNOSIS — E2749 Other adrenocortical insufficiency: Secondary | ICD-10-CM | POA: Diagnosis not present

## 2019-08-13 DIAGNOSIS — E039 Hypothyroidism, unspecified: Secondary | ICD-10-CM | POA: Diagnosis not present

## 2019-08-13 DIAGNOSIS — E119 Type 2 diabetes mellitus without complications: Secondary | ICD-10-CM | POA: Diagnosis not present

## 2019-08-13 DIAGNOSIS — E7800 Pure hypercholesterolemia, unspecified: Secondary | ICD-10-CM | POA: Diagnosis not present

## 2019-08-18 ENCOUNTER — Ambulatory Visit (INDEPENDENT_AMBULATORY_CARE_PROVIDER_SITE_OTHER): Payer: BC Managed Care – PPO | Admitting: Primary Care

## 2019-08-18 ENCOUNTER — Other Ambulatory Visit: Payer: Self-pay

## 2019-08-18 ENCOUNTER — Encounter: Payer: Self-pay | Admitting: *Deleted

## 2019-08-18 ENCOUNTER — Telehealth: Payer: Self-pay | Admitting: Primary Care

## 2019-08-18 DIAGNOSIS — J4530 Mild persistent asthma, uncomplicated: Secondary | ICD-10-CM

## 2019-08-18 MED ORDER — PREDNISONE 10 MG PO TABS: ORAL_TABLET | ORAL | 0 refills | Status: DC

## 2019-08-18 NOTE — Telephone Encounter (Signed)
Patient needs a letter stating she is high risk for asthma complications d/t mold exposure (see below) printed and uploaded to [facility] for tomorrow afternoon.  This patient is under Dr. Melvyn Novas and my care in the division of pulmonary medicine for chronic asthma which has historically been difficult to control. She requires high doses of asthma medications and yet still has persistent elevation of FENO, which indicates ongoing allergen exposures and ongoing reaction to allergens in her airways.   She is high risk for respiratory complications from asthma due to possible mold exposure at work. If there is an option for individual to stay in current work environment and be moved to a different floor/location or work from home until [place] has been resolved. She should not be exposed to any obvious allergens such as mold in any form.

## 2019-08-18 NOTE — Telephone Encounter (Signed)
Letter done and in her mychart and also printed her a copy  Pt aware  She will pick up copy tomorrow when she comes in for labs

## 2019-08-18 NOTE — Progress Notes (Signed)
Virtual Visit via Telephone Note  I connected with Carrie [name] on 08/18/19 at 10:30 AM EDT by telephone and verified that I am speaking with the correct person using two identifiers.  Location: Patient: Home Provider: Office   I discussed the limitations, risks, security and privacy concerns of performing an evaluation and management service by telephone and the availability of in person appointments. I also discussed with the patient that there may be a patient responsible charge related to this service. The patient expressed understanding and agreed to proceed.   History of Present Illness: 59 year old female, never smoked.  Past medical history significant for mild persistent asthma, upper airway cough syndrome.  Patient of Dr. Melvyn Novas, last seen 03/22/2019.  Patient positive for COVID-19 in November 2020.  Maintained on Symbicort 160 two puffs twice daily, singular 10 mg at bedtime.  08/18/2019 Patient contacted today for telephone visit. She has hx of chronic asthma. States that her asthma symptoms have been very manageable at this point. She was weaned off prednisone 1 year ago, worked very hard to come off of oral steroids. Works in Programmer, applications. She has a significant sensitivity to mold. The last 7 years she has worked in the same building and then 3 months ago she was moved into an older building which has a long history of mildew and mold. States that her eye are always feel itchy and feels that there are fibers on her eyelashes. She reports experiencing airway irritated, sneezing, post nasal drip, skin itching at work. She has been wheezing more and needing to use her Albuterol inhaler frequently. Previously she had not needed to use her rescue inhaler except for once or twice a year. She is full vaccinated for COVID.    Observations/Objective:  Observations:  - Voice hoarseness and cough  Testing: - FENO 02/20/2018  =   147 on qvar 80 2bid   Assessment and Plan: She is high risk  for respiratory complications from asthma due to possible mold exposure at work. If there is an option for individual to stay in current work environment and be moved to a different floor/location or work from home until [place] has been resolved. She should not be exposed to any obvious allergens such as mold in any form.   Asthma: - Previously well controlled until recent exposure to mold at work - Continue Symbicort 160 two puffs twice daily; Albuterol hfa/neb q 6 hours prn sob/wheezing and Singulair 10 mg at bedtime - RX prednisone taper to have on hand for acute exacerbation of wheezing  - Provide letter to patient's work asking that she avoid exposure to obvious allergens such as mold as much as possible  - Check CBC with diff and allergy mold panel   Follow Up Instructions:   - Labs and letter for work; follow-up as needed   I discussed the assessment and treatment plan with the patient. The patient was provided an opportunity to ask questions and all were answered. The patient agreed with the plan and demonstrated an understanding of the instructions.   The patient was advised to call back or seek an in-person evaluation if the symptoms worsen or if the condition fails to improve as anticipated.  I provided 25 minutes of non-face-to-face time during this encounter.   Martyn Ehrich, NP

## 2019-08-18 NOTE — Patient Instructions (Signed)
High risk for respiratory complications from asthma due to possible mold exposure at work. If there is an option for individual to stay in current work environment and be moved to a different floor/location or work from home until [place] has been resolved. She should not be exposed to any obvious allergens such as mold in any form.   Asthma: - Previously well controlled until recent exposure to mold at work - Continue Symbicort 160 two puffs twice daily; Albuterol hfa/neb q 6 hours prn sob/wheezing and Singulair 10 mg at bedtime - RX prednisone taper to have on hand for acute exacerbation of wheezing  - Provide letter to patient's work asking that she avoid exposure to obvious allergens such as mold as much as possible  - Check CBC with diff and allergy mold panel   Follow Up Instructions:   - Labs and letter for work; follow-up as needed

## 2019-08-19 ENCOUNTER — Other Ambulatory Visit (INDEPENDENT_AMBULATORY_CARE_PROVIDER_SITE_OTHER): Payer: BC Managed Care – PPO

## 2019-08-19 DIAGNOSIS — J4530 Mild persistent asthma, uncomplicated: Secondary | ICD-10-CM

## 2019-08-19 DIAGNOSIS — U071 COVID-19: Secondary | ICD-10-CM

## 2019-08-19 LAB — CBC WITH DIFFERENTIAL/PLATELET
Basophils Absolute: 0 10*3/uL (ref 0.0–0.1)
Basophils Relative: 0.9 % (ref 0.0–3.0)
Eosinophils Absolute: 0.2 10*3/uL (ref 0.0–0.7)
Eosinophils Relative: 3.6 % (ref 0.0–5.0)
HCT: 38.7 % (ref 36.0–46.0)
Hemoglobin: 12.6 g/dL (ref 12.0–15.0)
Lymphocytes Relative: 25.1 % (ref 12.0–46.0)
Lymphs Abs: 1.3 10*3/uL (ref 0.7–4.0)
MCHC: 32.6 g/dL (ref 30.0–36.0)
MCV: 87.5 fl (ref 78.0–100.0)
Monocytes Absolute: 0.3 10*3/uL (ref 0.1–1.0)
Monocytes Relative: 6.2 % (ref 3.0–12.0)
Neutro Abs: 3.3 10*3/uL (ref 1.4–7.7)
Neutrophils Relative %: 64.2 % (ref 43.0–77.0)
Platelets: 274 10*3/uL (ref 150.0–400.0)
RBC: 4.42 Mil/uL (ref 3.87–5.11)
RDW: 15.3 % (ref 11.5–15.5)
WBC: 5.2 10*3/uL (ref 4.0–10.5)

## 2019-08-19 LAB — SARS-COV-2 IGG: SARS-COV-2 IgG: 28.58

## 2019-08-20 LAB — ALLERGY PANEL 11, MOLD GROUP
Allergen, A. alternata, m6: 3.81 kU/L — ABNORMAL HIGH
Allergen, Mucor Racemosus, M4: <0.1 kU/L
Aspergillus fumigatus, m3: 0.36 kU/L — ABNORMAL HIGH
CLADOSPORIUM HERBARUM (M2) IGE: 0.19 kU/L — ABNORMAL HIGH
CLASS: 0
CLASS: 0
Candida Albicans: <0.1 kU/L
Class: 1
Class: 3

## 2019-08-20 LAB — INTERPRETATION:

## 2019-08-23 NOTE — Progress Notes (Signed)
Please let patient know mold panel was elevated, if she needed we can mail a copy for her work

## 2019-08-24 ENCOUNTER — Telehealth: Payer: Self-pay | Admitting: Primary Care

## 2019-08-24 NOTE — Telephone Encounter (Signed)
Martyn Ehrich, NP  08/23/2019 10:59 AM EDT    Please let patient know mold panel was elevated, if she needed we can mail a copy for her work    [employer] and spoke with pt letting her know the results of labwork and she verbalized understanding. Pt requested to have labwork faxed to her at work and I have taken care of doing this for pt. Nothing further needed.

## 2019-09-07 NOTE — Telephone Encounter (Signed)
Happy to write letter that patient appears to be allergic to something at work but at this point strongly feel a formal allergy eval is the best step > refer to [facility] and excuse from work in interim

## 2019-09-07 NOTE — Telephone Encounter (Signed)
Dr Melvyn Novas, please advise on pt email thanks!  Dr. Melvyn Novas,  First off thank you for providing the previous letters to my employer.  I received notification today that an air quality test was not in my office (only) and there was no mold detected.  All I know is that there is something in the air of the floor where I have been assigned to work. Im not sure why the entire area wasn't checked because I have to be in and out of my office over the course of the. Things have gotten worst for me on that floor every since we lifted the mask restrictions for those who have been fully vaccinated.  I have been asked to get another note based on the test showing no mold in my office.  Im very confused on what I should do. Believe this or  not but I actually work in [place] and have been with this company 8 years.  I honestly can go back to having back asthma.  Is there anything you can suggest?  I need my job and my health. Health over this situation any day.  Carrie [name]

## 2019-09-09 MED ORDER — BUDESONIDE-FORMOTEROL FUMARATE 160-4.5 MCG/ACT IN AERO: 2.0000 | INHALATION_SPRAY | Freq: Two times a day (BID) | RESPIRATORY_TRACT | 2 refills | Status: DC

## 2019-09-09 MED ORDER — ALBUTEROL SULFATE HFA 108 (90 BASE) MCG/ACT IN AERS: INHALATION_SPRAY | RESPIRATORY_TRACT | 2 refills | Status: DC

## 2019-09-14 ENCOUNTER — Ambulatory Visit
Admission: RE | Admit: 2019-09-14 | Discharge: 2019-09-14 | Disposition: A | Discharge status: Home / Self Care | Payer: BC Managed Care – PPO | Source: Ambulatory Visit | Attending: Internal Medicine | Admitting: Internal Medicine

## 2019-09-14 ENCOUNTER — Other Ambulatory Visit: Payer: Self-pay | Admitting: Internal Medicine

## 2019-09-14 ENCOUNTER — Other Ambulatory Visit: Payer: Self-pay

## 2019-09-14 DIAGNOSIS — Z1231 Encounter for screening mammogram for malignant neoplasm of breast: Secondary | ICD-10-CM

## 2019-09-17 ENCOUNTER — Other Ambulatory Visit: Payer: Self-pay | Admitting: Internal Medicine

## 2019-09-17 DIAGNOSIS — E785 Hyperlipidemia, unspecified: Secondary | ICD-10-CM

## 2019-09-17 DIAGNOSIS — E1169 Type 2 diabetes mellitus with other specified complication: Secondary | ICD-10-CM

## 2019-09-20 ENCOUNTER — Other Ambulatory Visit: Payer: Self-pay | Admitting: Internal Medicine

## 2019-09-20 DIAGNOSIS — F988 Other specified behavioral and emotional disorders with onset usually occurring in childhood and adolescence: Secondary | ICD-10-CM

## 2019-09-21 NOTE — Telephone Encounter (Signed)
Please deny.  Patient will need an office visit. °

## 2019-09-22 ENCOUNTER — Other Ambulatory Visit: Payer: Self-pay | Admitting: Internal Medicine

## 2019-09-22 DIAGNOSIS — F988 Other specified behavioral and emotional disorders with onset usually occurring in childhood and adolescence: Secondary | ICD-10-CM

## 2019-09-22 NOTE — Telephone Encounter (Signed)
Can you please deny?  There is a refill on file.  Thanks.

## 2019-10-21 ENCOUNTER — Encounter: Payer: Self-pay | Admitting: Internal Medicine

## 2019-10-28 ENCOUNTER — Other Ambulatory Visit: Payer: Self-pay

## 2019-10-28 ENCOUNTER — Encounter: Payer: Self-pay | Admitting: Internal Medicine

## 2019-10-28 ENCOUNTER — Ambulatory Visit (INDEPENDENT_AMBULATORY_CARE_PROVIDER_SITE_OTHER): Payer: BC Managed Care – PPO | Admitting: Internal Medicine

## 2019-10-28 VITALS — BP 130/90 | HR 78 | Temp 98.4°F | Wt 200.4 lb

## 2019-10-28 DIAGNOSIS — F988 Other specified behavioral and emotional disorders with onset usually occurring in childhood and adolescence: Secondary | ICD-10-CM | POA: Diagnosis not present

## 2019-10-28 DIAGNOSIS — R030 Elevated blood-pressure reading, without diagnosis of hypertension: Secondary | ICD-10-CM | POA: Diagnosis not present

## 2019-10-28 MED ORDER — AMPHETAMINE-DEXTROAMPHETAMINE 10 MG PO TABS: 10.0000 mg | ORAL_TABLET | Freq: Every day | ORAL | 0 refills | Status: DC

## 2019-10-28 MED ORDER — AMPHETAMINE-DEXTROAMPHET ER 20 MG PO CP24: ORAL_CAPSULE | ORAL | 0 refills | Status: DC

## 2019-10-28 MED ORDER — AMPHETAMINE-DEXTROAMPHET ER 20 MG PO CP24: 20.0000 mg | ORAL_CAPSULE | Freq: Two times a day (BID) | ORAL | 0 refills | Status: DC

## 2019-10-28 NOTE — Progress Notes (Signed)
Established Patient Office Visit     This visit occurred during the SARS-CoV-2 public health emergency.  Safety protocols were in place, including screening questions prior to the visit, additional usage of staff PPE, and extensive cleaning of exam room while observing appropriate contact time as indicated for disinfecting solutions.    CC/Reason for Visit: Discuss ADHD, medication refills  HPI: Carrie [name] is a 59 y.o. female who is coming in today for the above mentioned reasons.  She is here today for every 56-month visit that is required for her Adderall prescriptions.  She has been taking 2 of the 20 mg XR tablets in the morning.  She feels like around 2 or 3:00 in the afternoon it wears off and she has difficulty focusing on her job.  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  She has no difficulty sleeping at night and feels that she tolerates the medication.   Past Medical/Surgical History: Past Medical History:  Diagnosis Date  . A-fib ([place])   . ADD (attention deficit disorder)   . Adrenal abnormality ([place])   . Anemia, iron deficiency   . Asthma   . Diabetes mellitus without complication ([place])   . Fibromyalgia   . Hiatal hernia   . Numbness and tingling   . Obese   . Thyroid disease     Past Surgical History:  Procedure Laterality Date  . ABDOMINAL HYSTERECTOMY    . KNEE SURGERY    . SHOULDER SURGERY    . TOTAL KNEE ARTHROPLASTY Left 01/15/2019   Procedure: TOTAL KNEE ARTHROPLASTY;  Surgeon: Sydnee Cabal, MD;  Location: [facility];  Service: Orthopedics;  Laterality: Left;    Social History:  reports that she has never smoked. She has never used smokeless tobacco. She reports current alcohol use. She reports that she does not use drugs.  Allergies: Allergies  Allergen Reactions  . Aspirin     REACTION: asthma- on respirator for 28 days  . Codeine Phosphate Hives  . Influenza Vaccines     Soreness, increased  asthma, side pain, trouble walking  . Penicillins Hives    REACTION: wheezing and hives  Did it involve swelling of the face/tongue/throat, SOB, or low BP? no Did it involve sudden or severe rash/hives, skin peeling, or any reaction on the inside of your mouth or nose? No Did you need to seek medical attention at a hospital or doctor's office? no When did it last happen?Childhood If all above answers are "NO", may proceed with cephalosporin use.    Family History:  Family History  Problem Relation Age of Onset  . Asthma Mother        seasonal  . Colon cancer Mother   . Lung cancer Maternal Uncle   . Esophageal cancer Father        smoker, mets to lungs  . Diabetes Sister      Current Outpatient Medications:  .  albuterol (PROAIR HFA) 108 (90 Base) MCG/ACT inhaler, USE 2 INHALATIONS EVERY 6 HOURS AS NEEDED, Disp: 18 g, Rfl: 2 .  albuterol (PROVENTIL) (2.5 MG/3ML) 0.083% nebulizer solution, USE 1 VIAL VIA NEBULIZER EVERY 4 HOURS AS NEEDED FOR WHEEZE/SHORTNESS OF BREATH (Patient taking differently: Take 2.5 mg by nebulization every 4 (four) hours as needed for wheezing or shortness of breath. ), Disp: 75 mL, Rfl: 6 .  amphetamine-dextroamphetamine (ADDERALL XR) 20 MG 24 hr capsule, Take 1 capsule (20 mg total)  by mouth 2 (two) times daily., Disp: 60 capsule, Rfl: 0 .  amphetamine-dextroamphetamine (ADDERALL XR) 20 MG 24 hr capsule, Take 1 capsule (20 mg total) by mouth 2 (two) times daily., Disp: 60 capsule, Rfl: 0 .  amphetamine-dextroamphetamine (ADDERALL XR) 20 MG 24 hr capsule, Take one capsule 2 times daily, Disp: 60 capsule, Rfl: 0 .  atorvastatin (LIPITOR) 20 MG tablet, TAKE 1 TABLET(20 MG) BY MOUTH DAILY, Disp: 90 tablet, Rfl: 1 .  budesonide-formoterol (SYMBICORT) 160-4.5 MCG/ACT inhaler, Inhale 2 puffs into the lungs 2 (two) times daily., Disp: 1 Inhaler, Rfl: 2 .  levothyroxine (SYNTHROID) 75 MCG tablet, Take 75 mcg by mouth daily before breakfast., Disp: , Rfl:  .   montelukast (SINGULAIR) 10 MG tablet, Take 1 tablet (10 mg total) by mouth at bedtime., Disp: 30 tablet, Rfl: 11 .  pantoprazole (PROTONIX) 40 MG tablet, Take 30-60 min before first meal of the day, Disp: 90 tablet, Rfl: 11 .  predniSONE (DELTASONE) 10 MG tablet, Take 4 tabs po daily x 2 days; then 3 tabs for 2 days; then 2 tabs for 2 days; then 1 tab for 2 days, Disp: 20 tablet, Rfl: 0 .  amphetamine-dextroamphetamine (ADDERALL) 10 MG tablet, Take 1 tablet (10 mg total) by mouth daily., Disp: 30 tablet, Rfl: 0 .  amphetamine-dextroamphetamine (ADDERALL) 10 MG tablet, Take 1 tablet (10 mg total) by mouth daily., Disp: 30 tablet, Rfl: 0 .  amphetamine-dextroamphetamine (ADDERALL) 10 MG tablet, Take 1 tablet (10 mg total) by mouth daily., Disp: 30 tablet, Rfl: 0  Review of Systems:  Constitutional: Denies fever, chills, diaphoresis, appetite change and fatigue.  HEENT: Denies photophobia, eye pain, redness, hearing loss, ear pain, congestion, sore throat, rhinorrhea, sneezing, mouth sores, trouble swallowing, neck pain, neck stiffness and tinnitus.   Respiratory: Denies SOB, DOE, cough, chest tightness,  and wheezing.   Cardiovascular: Denies chest pain, palpitations and leg swelling.  Gastrointestinal: Denies nausea, vomiting, abdominal pain, diarrhea, constipation, blood in stool and abdominal distention.  Genitourinary: Denies dysuria, urgency, frequency, hematuria, flank pain and difficulty urinating.  Endocrine: Denies: hot or cold intolerance, sweats, changes in hair or nails, polyuria, polydipsia. Musculoskeletal: Denies myalgias, back pain, joint swelling, arthralgias and gait problem.  Skin: Denies pallor, rash and wound.  Neurological: Denies dizziness, seizures, syncope, weakness, light-headedness, numbness and headaches.  Hematological: Denies adenopathy. Easy bruising, personal or family bleeding history  Psychiatric/Behavioral: Denies suicidal ideation, mood changes, confusion,  nervousness, sleep disturbance and agitation    Physical Exam: Vitals:   10/28/19 0726  BP: 130/90  Pulse: 78  Temp: 98.4 F (36.9 C)  TempSrc: Oral  SpO2: 98%  Weight: 200 lb 6.4 oz (90.9 kg)    Body mass index is 35.5 kg/m.   Constitutional: NAD, calm, comfortable Eyes: PERRL, lids and conjunctivae normal ENMT: Mucous membranes are moist.  Neurologic: Grossly intact and nonfocal Psychiatric: Normal judgment and insight. Alert and oriented x 3. Normal mood.    Impression and Plan:  Attention deficit disorder, unspecified hyperactivity presence  -PDMP reviewed, no red flags, overdose risk score is 190. -I have agreed to add an additional 10 mg immediate release Adderall tablet for her to take at around 2 or 3:00 in the afternoon in addition to the 40 mg XR that she takes in the morning. -She will call me with updates on tolerability and job function.  Elevated blood pressure without diagnosis of hypertension -Blood pressure is noted to be 130/90 in office today.  She has strong family history of  hypertension. -She will do ambulatory monitoring and return in 3 months for follow-up    Jerric Oyen Isaac Bliss, MD [facility]

## 2019-11-18 ENCOUNTER — Ambulatory Visit: Payer: BC Managed Care – PPO | Admitting: Internal Medicine

## 2019-11-26 ENCOUNTER — Encounter: Payer: Self-pay | Admitting: *Deleted

## 2019-11-26 ENCOUNTER — Other Ambulatory Visit: Payer: Self-pay | Admitting: Internal Medicine

## 2019-11-26 DIAGNOSIS — F988 Other specified behavioral and emotional disorders with onset usually occurring in childhood and adolescence: Secondary | ICD-10-CM

## 2019-11-26 MED ORDER — AMPHETAMINE-DEXTROAMPHET ER 20 MG PO CP24: 20.0000 mg | ORAL_CAPSULE | Freq: Two times a day (BID) | ORAL | 0 refills | Status: DC

## 2019-11-26 MED ORDER — AMPHETAMINE-DEXTROAMPHETAMINE 10 MG PO TABS: 10.0000 mg | ORAL_TABLET | Freq: Every day | ORAL | 0 refills | Status: DC

## 2019-12-17 IMAGING — DX DG CHEST 1V PORT
1 series · 1 of 1 positions shown · non-contrast
Comparison: June 01, 2014

CLINICAL DATA: Weakness.  Atrial fibrillation.

EXAM:
PORTABLE CHEST 1 VIEW

[chest ap]
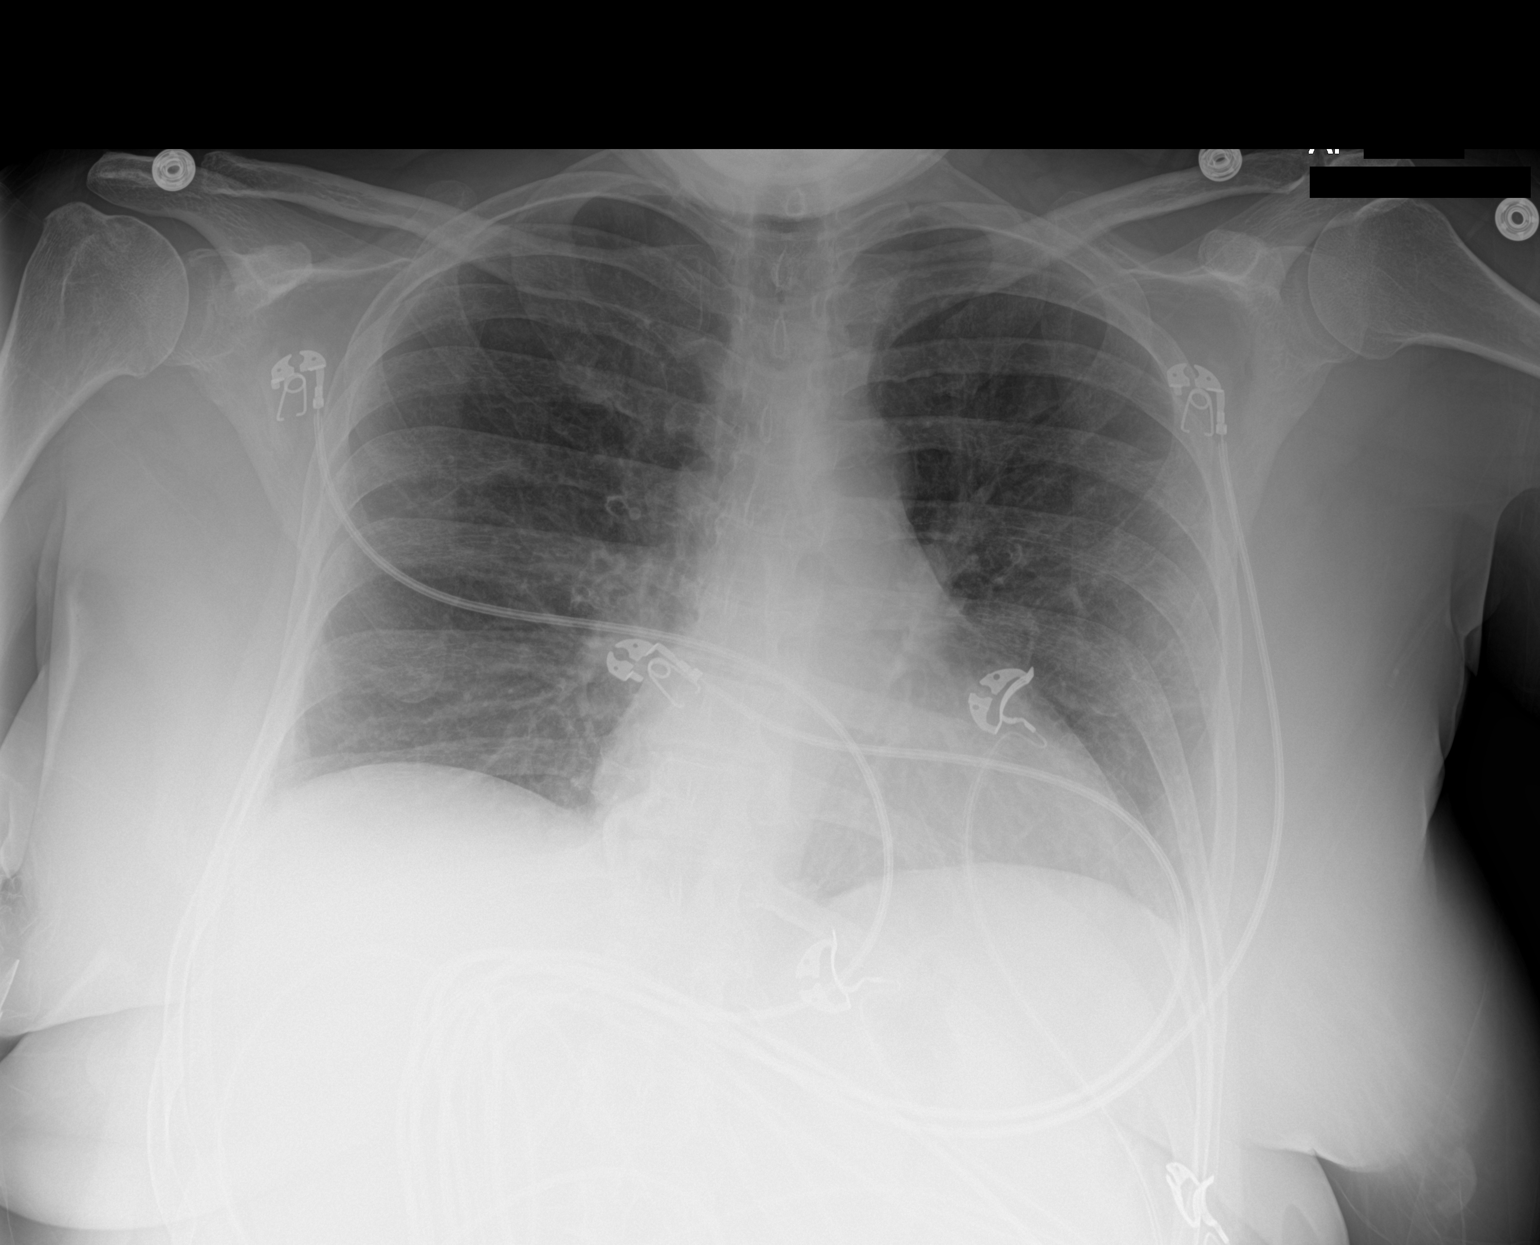

[1 of 1 positions shown; findings below may reference images not displayed]

FINDINGS: Lungs are clear. Heart size and pulmonary vascularity are normal. No
adenopathy. No bone lesions.
IMPRESSION: No edema or consolidation.  Cardiac silhouette within normal limits.

## 2019-12-27 ENCOUNTER — Encounter: Payer: Self-pay | Admitting: Internal Medicine

## 2019-12-27 DIAGNOSIS — Z1211 Encounter for screening for malignant neoplasm of colon: Secondary | ICD-10-CM

## 2020-01-17 ENCOUNTER — Other Ambulatory Visit: Payer: Self-pay | Admitting: Internal Medicine

## 2020-01-17 DIAGNOSIS — F988 Other specified behavioral and emotional disorders with onset usually occurring in childhood and adolescence: Secondary | ICD-10-CM

## 2020-01-17 NOTE — Telephone Encounter (Signed)
Last office visit 10/28/19

## 2020-01-19 NOTE — Telephone Encounter (Signed)
Please deny. Thanks!

## 2020-01-20 ENCOUNTER — Encounter: Payer: Self-pay | Admitting: Internal Medicine

## 2020-01-20 DIAGNOSIS — F988 Other specified behavioral and emotional disorders with onset usually occurring in childhood and adolescence: Secondary | ICD-10-CM

## 2020-01-21 MED ORDER — AMPHETAMINE-DEXTROAMPHETAMINE 10 MG PO TABS: 10.0000 mg | ORAL_TABLET | Freq: Every day | ORAL | 0 refills | Status: DC

## 2020-01-21 MED ORDER — AMPHETAMINE-DEXTROAMPHET ER 20 MG PO CP24: 20.0000 mg | ORAL_CAPSULE | Freq: Two times a day (BID) | ORAL | 0 refills | Status: DC

## 2020-01-21 MED ORDER — AMPHETAMINE-DEXTROAMPHET ER 20 MG PO CP24: ORAL_CAPSULE | ORAL | 0 refills | Status: DC

## 2020-01-25 ENCOUNTER — Other Ambulatory Visit: Payer: Self-pay | Admitting: Internal Medicine

## 2020-01-25 DIAGNOSIS — E785 Hyperlipidemia, unspecified: Secondary | ICD-10-CM

## 2020-01-25 DIAGNOSIS — E1169 Type 2 diabetes mellitus with other specified complication: Secondary | ICD-10-CM

## 2020-02-03 ENCOUNTER — Ambulatory Visit: Payer: BC Managed Care – PPO | Admitting: Obstetrics and Gynecology

## 2020-03-17 ENCOUNTER — Encounter: Payer: Self-pay | Admitting: Internal Medicine

## 2020-03-21 ENCOUNTER — Ambulatory Visit (INDEPENDENT_AMBULATORY_CARE_PROVIDER_SITE_OTHER): Payer: BC Managed Care – PPO | Admitting: Internal Medicine

## 2020-03-21 ENCOUNTER — Other Ambulatory Visit: Payer: Self-pay

## 2020-03-21 ENCOUNTER — Encounter: Payer: Self-pay | Admitting: Internal Medicine

## 2020-03-21 DIAGNOSIS — J4530 Mild persistent asthma, uncomplicated: Secondary | ICD-10-CM | POA: Diagnosis not present

## 2020-03-21 NOTE — Patient Instructions (Signed)
No change in medications  Weight control is simply a matter of calorie balance which needs to be tilted in your favor by eating less and exercising more.  To get the most out of exercise, you need to be continuously aware that you are short of breath, but never out of breath, for 30 minutes daily. As you improve, it will actually be easier for you to do the same amount of exercise  in  30 minutes so always push to the level where you are short of breath.     After spring season, if you are doing fine on the 160 you can try the 14  - call if you want to try it (it's not the same though as  Half of the  160 so the instructions are both 2 every 12 hours    Please schedule a follow up visit in 12  months but call sooner if needed

## 2020-03-21 NOTE — Progress Notes (Signed)
Subjective:    Patient ID: Carrie [name], female   DOB: 1960-09-03    MRN: [id number]     Brief patient profile:  46 yobf never smoker onset asthma age 60 and placed on shots for "everything that grows" needed to stay on inhalers / complicated by 3 admits requiring ventilator last time around age 85 and overall much better since then but intermittently on prednisone and as of 2011 required daily prednisone so referred to pulmonary clinic 02/13/2016 by Dr   Julianne Rice allergy shots but off  Around 2000    History of Present Illness  02/13/2016 1st [facility] office visit/ Carrie [name]  maint rx qvar 80 2bid/ singiulair/ pred 10  Chief Complaint  Patient presents with  . Pulmonary Consult    Referred by Dr. Sherren Mocha for eval of Asthma. Pt states that she has been on prednisone for years for her asthma. She is currently not having any symptoms.   for the last six years only comes off prednisone for a few days then back on it due to wheeze/ sob s much cough and presently on 10 mg daily and qvar 80 2 every 12 hours and no saba x sev weeks   Apparently doesn't taper below 10 in any increment Has intermittent hb on prn ppi  rec Pantoprazole (protonix) 40 mg   Take  30-60 min before first meal of the day and Pepcid (famotidine)  20 mg one @  bedtime until return to office    Qvar 80 Take 2 puffs first thing in am and then another 2 puffs about 12 hours later.  Work on inhaler technique:     Only use your albuterol as a rescue medication  Try Prednisone 5 mg daily x 2 weeks then  5 alternating with 2.5 mg (one-half pill) - if worse resume previous dose     04/16/2016  f/u ov/Carrie [name] re: steroid dep asthma with pred down to 5 mg daily / qvar 80 2bid / singulair /gerd rx  Chief Complaint  Patient presents with  . Follow-up    Breathing is overall doing well. She c/o minimal congestion "behind my nose"- just started today. She is on pred 5 mg daily and has not went any lower than that. She has  not had to use her proair.   Not limited by breathing from desired activities  / no saba use at all  Some nausea at the lower doses of pred but very happy with wt loss on the lower doses rec Take 5 mg one half daily x 2 weeks then one half even days x 2 weeks then one half every 3 days x 2 weeks and stop. If get worse nausea/ loss of appetite at any point go back up to higher dose  and alternate doses even/odd days    06/26/2016  f/u ov/Carrie [name] re:  Chronic  Asthma / on qvar 80 2bid / singulair/ gerd rx  and cortef per Suzette Battiest for adrenal insuff Chief Complaint  Patient presents with  . Follow-up    Pt c/o hoarseness from the pollen. Pt denies cough, wheezing, and SOB, CP/tightness.    saba once a week at most / some daytime only  throat clearing but o/w doing fine rec GERD diet  Plan A = Automatic = Qvar 80 Take 2 puffs first thing in am and then another 2 puffs about 12 hours later.  Plan B = Backup Only use your albuterol as a rescue   Plan  C = Crisis - only use your albuterol nebulizer if you first try Plan B and it fails to help > ok to use the nebulizer up to every 4 hours but if start needing it regularly call for immediate appointment         02/20/2018  f/u ov/Carrie [name] re: mild asthma reduced  pred down to pred 2 mg bid then asthma flared whereas  on 5mg  bid no breakthru Chief Complaint  Patient presents with  . Follow-up    Breathing is doing well today. She is using her albuterol inhaler several times per day. She rarely uses neb.  Dyspnea:  Not limited by breathing from desired activities  - needing saba sev times a week  Cough: no cough  Sleeping: wakes up 3-4 x week feeling wheezing > takes  albuterol and back to bed / last 12 hours prio  SABA use: sev times a day s pattern indoors vs outdoors, time of day or temp  02: none  Noct symptoms  worse off pepcid with overt noct hb  rec Stop qvar  Start symbicort 160 ake 2 puffs first thing in am and then another 2 puffs about  12 hours later.  Only use your albuterol as a rescue medication  Work on inhaler technique.     03/19/2018  f/u ov/Carrie [name] re: chronic asthma / off pred mid Dec 2019 / maint symb 160 2bid and no rescue on hand  Chief Complaint  Patient presents with  . Follow-up    Breathing is doing well and she has not used her albuterol inhaler recently. No new co's.   Dyspnea:  Not limited by breathing from desired activities / but by L knee Cough: no issue  Sleeping: ok now flat/ one pillow  SABA use: no rescue  rec Plan A = Automatic = symbicort 160 Take 2 puffs first thing in am and then another 2 puffs about 12 hours later.  Plan B = Backup Only use your albuterol inhaler as a rescue medication Plan C = Crisis - only use your albuterol nebulizer if you first try Plan B and it fails to help > ok to use the nebulizer up to every 4 hours but if start needing it regularly call for immediate appointment    09/17/2018  f/u ov/Carrie [name] re:  Asthma  Chief Complaint  Patient presents with  . Follow-up    Breathing is doing well. She has had a scratchy throat and so stopped using symbicort and then developed body aches and dizziness- started back on symbicort and symptoms resolved. She states she has to clear her throat often. She uses her rescue inhaler once per wk on average and rarely uses neb.   Dyspnea:  Not limited by breathing from desired activities  But by L knee pain Cough: none Sleeping: flat/ one pillow SABA use: once a week/ more when stopped symbicort 02: none Onset June 15th st, aches mostly no cough or fever > covid test June 17th pending in meantime all acute symptoms have resolved  rec Try to time your symbicort to before you brush your teeth with a baking soda based tooth paste  No change in medications Only use your albuterol as a rescue medication    L  TKR  01/15/2019  Onset 01/31/2019 fatigue and loss of smell ER 02/04/19 not tested > health dept pos COVID 19   03/22/2019  f/u  ov/Carrie [name] re: asthma s/p covid infection outpt rx  Chief Complaint  Patient presents with  .  Follow-up    Asthma, no symptoms  Dyspnea:  Not limited by breathing from desired activities   Cough: none  Sleeping: poorly due to insomnia since covid  but no resp c/o's on one pillow SABA use: hfa rarely 02: none  rec Refer to allergy    03/21/2020  f/u ov/Carrie [name] re: asthma / main ton symciort 160  Chief Complaint  Patient presents with  . Follow-up    Breathing doing well and she has not had to use her rescue inhaler recently.    Dyspnea:  Not limited by breathing from desired activities   Cough: none Sleeping: fine flat SABA use: rarely  02: none    No obvious day to day or daytime variability or assoc excess/ purulent sputum or mucus plugs or hemoptysis or cp or chest tightness, subjective wheeze or overt sinus or hb symptoms.   Sleeping  without nocturnal  or early am exacerbation  of respiratory  c/o's or need for noct saba. Also denies any obvious fluctuation of symptoms with weather or environmental changes or other aggravating or alleviating factors except as outlined above   No unusual exposure hx or h/o childhood pna  or knowledge of premature birth.  Current Allergies, Complete Past Medical History, Past Surgical History, Family History, and Social History were reviewed in [employer] record.  ROS  The following are not active complaints unless bolded Hoarseness, sore throat, dysphagia, dental problems, itching, sneezing,  nasal congestion or discharge of excess mucus or purulent secretions, ear ache,   fever, chills, sweats, unintended wt loss or wt gain, classically pleuritic or exertional cp,  orthopnea pnd or arm/hand swelling  or leg swelling, presyncope, palpitations, abdominal pain, anorexia, nausea, vomiting, diarrhea  or change in bowel habits or change in bladder habits, change in stools or change in urine, dysuria, hematuria,  rash, arthralgias,  visual complaints, headache, numbness, weakness or ataxia or problems with walking or coordination,  change in mood or  memory.        Current Meds  Medication Sig  . albuterol (PROAIR HFA) 108 (90 Base) MCG/ACT inhaler USE 2 INHALATIONS EVERY 6 HOURS AS NEEDED  . albuterol (PROVENTIL) (2.5 MG/3ML) 0.083% nebulizer solution USE 1 VIAL VIA NEBULIZER EVERY 4 HOURS AS NEEDED FOR WHEEZE/SHORTNESS OF BREATH (Patient taking differently: Take 2.5 mg by nebulization every 4 (four) hours as needed for wheezing or shortness of breath.)  . amphetamine-dextroamphetamine (ADDERALL XR) 20 MG 24 hr capsule Take 1 capsule (20 mg total) by mouth 2 (two) times daily.  Marland Kitchen amphetamine-dextroamphetamine (ADDERALL XR) 20 MG 24 hr capsule Take one capsule 2 times daily  . amphetamine-dextroamphetamine (ADDERALL XR) 20 MG 24 hr capsule Take 1 capsule (20 mg total) by mouth 2 (two) times daily.  Marland Kitchen amphetamine-dextroamphetamine (ADDERALL) 10 MG tablet Take 1 tablet (10 mg total) by mouth daily.  Marland Kitchen amphetamine-dextroamphetamine (ADDERALL) 10 MG tablet Take 1 tablet (10 mg total) by mouth daily.  Marland Kitchen amphetamine-dextroamphetamine (ADDERALL) 10 MG tablet Take 1 tablet (10 mg total) by mouth daily.  Marland Kitchen atorvastatin (LIPITOR) 20 MG tablet TAKE 1 TABLET(20 MG) BY MOUTH DAILY  . budesonide-formoterol (SYMBICORT) 160-4.5 MCG/ACT inhaler Inhale 2 puffs into the lungs 2 (two) times daily.  Marland Kitchen levothyroxine (SYNTHROID) 75 MCG tablet Take 75 mcg by mouth daily before breakfast.  . montelukast (SINGULAIR) 10 MG tablet Take 1 tablet (10 mg total) by mouth at bedtime.  . pantoprazole (PROTONIX) 40 MG tablet Take 30-60 min before first meal of the day  Objective:   Physical Exam    03/21/2020          201 03/22/2019         197   09/17/2018        203  03/19/2018         206  02/20/2018       203  12/27/2016     173  06/26/2016        188  04/16/2016       191   02/13/16 203 lb 3.2 oz (92.2 kg)  02/05/16 200 lb (90.7 kg)   01/24/16 200 lb (90.7 kg)      Vital signs reviewed  03/21/2020  - Note at rest 02 sats  976% on RA   General appearance:    Mildly bese bf nad   HEENT : pt wearing mask not removed for exam due to covid -19 concerns.    NECK :  without JVD/Nodes/TM/ nl carotid upstrokes bilaterally   LUNGS: no acc muscle use,  Nl contour chest which is clear to A and P bilaterally without cough on insp or exp maneuvers   CV:  RRR  no s3 or murmur or increase in P2, and no edema   ABD:  soft and nontender with nl inspiratory excursion in the supine position. No bruits or organomegaly appreciated, bowel sounds nl  MS:  Nl gait/ ext warm without deformities, calf tenderness, cyanosis or clubbing No obvious joint restrictions   SKIN: warm and dry without lesions    NEURO:  alert, approp, nl sensorium with  no motor or cerebellar deficits apparent.          Assessment:

## 2020-03-21 NOTE — Assessment & Plan Note (Addendum)
Onset age 60 Spirometry 02/13/2016  Nl x mid flows reduced to 56% and min curvature  pre - saba on qvar 80 2bid and pred 10 mg daily > tapered off as of 06/26/2016 only on physiologic steroids per dr Christeen Douglas guidance  - 12/27/2016     continue qvar 80 2bid - Spirometry 02/20/2018  FEV1 1.6 (78%)  Ratio 68 with mild curvature > 6 h since last saba  - FENO 02/20/2018  =   147 on qvar 80 2bid  - 02/20/2018    try step up to symb 160 2bid from qvar and off chronic pred mid dec 2019 - 09/17/2018  After extensive coaching inhaler device,  effectiveness =    90%    All goals of chronic asthma control met including optimal function and elimination of symptoms with minimal need for rescue therapy.  Contingencies discussed in full including contacting this office immediately if not controlling the symptoms using the rule of two's.     Could consider the symb 80 p srping which tends to be her worse time of year.  F/u can be yearly          Each maintenance medication was reviewed in detail including emphasizing most importantly the difference between maintenance and prns and under what circumstances the prns are to be triggered using an action plan format where appropriate.  Total time for H and P, chart review, counseling,  and generating customized AVS unique to this office visit / charting = 26 min

## 2020-03-29 ENCOUNTER — Encounter: Payer: Self-pay | Admitting: Obstetrics and Gynecology

## 2020-03-29 ENCOUNTER — Ambulatory Visit (INDEPENDENT_AMBULATORY_CARE_PROVIDER_SITE_OTHER): Payer: BC Managed Care – PPO | Admitting: Obstetrics and Gynecology

## 2020-03-29 ENCOUNTER — Other Ambulatory Visit: Payer: Self-pay

## 2020-03-29 VITALS — BP 132/80 | Ht 62.0 in | Wt 209.0 lb

## 2020-03-29 DIAGNOSIS — Z01419 Encounter for gynecological examination (general) (routine) without abnormal findings: Secondary | ICD-10-CM | POA: Diagnosis not present

## 2020-03-29 DIAGNOSIS — Z13820 Encounter for screening for osteoporosis: Secondary | ICD-10-CM | POA: Diagnosis not present

## 2020-03-29 DIAGNOSIS — Z124 Encounter for screening for malignant neoplasm of cervix: Secondary | ICD-10-CM | POA: Diagnosis not present

## 2020-03-29 NOTE — Progress Notes (Signed)
Carrie [name] [birth year] [id number]  SUBJECTIVE:  60 y.o. G2P2 female new patient here for a breast and pelvic exam and Pap smear. She had a prior supracervical hysterectomy in 2006 for uterine fibroids.  She has not had a Pap smear in several years because she says her previous primary provider indicated she did not need them after having a hysterectomy. Otherwise has no gynecologic concerns. Married >20 yrs. Works in [place] at [employer].  Current Outpatient Medications  Medication Sig Dispense Refill  . albuterol (PROAIR HFA) 108 (90 Base) MCG/ACT inhaler USE 2 INHALATIONS EVERY 6 HOURS AS NEEDED 18 g 2  . albuterol (PROVENTIL) (2.5 MG/3ML) 0.083% nebulizer solution USE 1 VIAL VIA NEBULIZER EVERY 4 HOURS AS NEEDED FOR WHEEZE/SHORTNESS OF BREATH (Patient taking differently: Take 2.5 mg by nebulization every 4 (four) hours as needed for wheezing or shortness of breath.) 75 mL 6  . amphetamine-dextroamphetamine (ADDERALL XR) 20 MG 24 hr capsule Take one capsule 2 times daily 60 capsule 0  . amphetamine-dextroamphetamine (ADDERALL) 10 MG tablet Take 1 tablet (10 mg total) by mouth daily. 30 tablet 0  . atorvastatin (LIPITOR) 20 MG tablet TAKE 1 TABLET(20 MG) BY MOUTH DAILY 90 tablet 1  . budesonide-formoterol (SYMBICORT) 160-4.5 MCG/ACT inhaler Inhale 2 puffs into the lungs 2 (two) times daily. 1 Inhaler 2  . levothyroxine (SYNTHROID) 75 MCG tablet Take 75 mcg by mouth daily before breakfast.    . montelukast (SINGULAIR) 10 MG tablet Take 1 tablet (10 mg total) by mouth at bedtime. 30 tablet 11  . pantoprazole (PROTONIX) 40 MG tablet Take 30-60 min before first meal of the day 90 tablet 11   No current facility-administered medications for this visit.   Allergies: Aspirin, Codeine phosphate, Influenza vaccines, and Penicillins  No LMP recorded. Patient has had a hysterectomy.  Past medical history,surgical history, problem list, medications, allergies, family history and social history were all  reviewed and documented as reviewed in the EPIC chart.  GYN ROS: no abnormal bleeding, pelvic pain or discharge, no breast pain or new or enlarging lumps on self exam.  No dysuria, frequency, burning, pain with urination, cloudy/malodorous urine.   OBJECTIVE:  BP 132/80 (BP Location: Right Arm, Patient Position: Sitting, Cuff Size: Normal)   Ht 5\' 2"  (1.575 m)   Wt 209 lb (94.8 kg)   BMI 38.23 kg/m  The patient appears well, alert, oriented, in no distress.  BREAST EXAM: breasts appear normal, no suspicious masses, no skin or nipple changes or axillary nodes  PELVIC EXAM: VULVA: normal appearing vulva with mild atrophic change, no masses, tenderness or lesions, VAGINA: normal appearing vagina with mild atrophic change, normal color and discharge, no lesions, CERVIX: Grossly normal appearing cervix, atrophic change, stenosis of external os, UTERUS: surgically absent, ADNEXA: no masses, nontender, PAP: Pap smear done today, thin-prep method  Chaperone: Wandra Scot Bonham present during the examination  ASSESSMENT:  60 y.o. G2P2 here for a breast and pelvic exam  PLAN:   1. Postmenopausal. Prior supracervical hysterectomy in 2006 for uterine fibroids.  No significant hot flashes or night sweats.  No vaginal bleeding. 2. Pap smear last done years ago.  Reports no history of abnormal Pap smears.  Pap smear/HPV collected today. 3. Mammogram 08/2019.  Normal breast exam today.  She is reminded to schedule an annual mammogram this year when due. 4. Colonoscopy 2016.  She will follow up at the interval recommended by her GI specialist.   5. DEXA 2016 normal per patient.  Was on prednisone due to asthma previously.  Next DEXA recommended now so she will plan to schedule this.  The patient is aware that I will only be at this practice until early March 2022 so she knows to make sure she requests follow-up on the results if testing is not done while I am still at the practice. 6. Health maintenance.   No labs today as she normally has these completed with her primary care doctor.  Return annually or sooner, prn.  Joseph Pierini MD 03/29/20

## 2020-03-30 ENCOUNTER — Encounter: Payer: Self-pay | Admitting: Obstetrics and Gynecology

## 2020-03-31 ENCOUNTER — Encounter: Payer: Self-pay | Admitting: Obstetrics and Gynecology

## 2020-04-05 LAB — PAP, TP IMAGING W/ HPV RNA, RFLX HPV TYPE 16,18/45: HPV DNA High Risk: NOT DETECTED

## 2020-04-17 ENCOUNTER — Encounter: Payer: Self-pay | Admitting: Internal Medicine

## 2020-04-18 ENCOUNTER — Ambulatory Visit (INDEPENDENT_AMBULATORY_CARE_PROVIDER_SITE_OTHER): Payer: Self-pay | Admitting: Plastic Surgery

## 2020-04-18 ENCOUNTER — Encounter: Payer: Self-pay | Admitting: Plastic Surgery

## 2020-04-18 ENCOUNTER — Other Ambulatory Visit: Payer: Self-pay

## 2020-04-18 VITALS — BP 126/67 | HR 107

## 2020-04-18 DIAGNOSIS — Z719 Counseling, unspecified: Secondary | ICD-10-CM

## 2020-04-18 NOTE — Progress Notes (Signed)

## 2020-05-01 ENCOUNTER — Other Ambulatory Visit: Payer: Self-pay | Admitting: Internal Medicine

## 2020-05-01 DIAGNOSIS — F988 Other specified behavioral and emotional disorders with onset usually occurring in childhood and adolescence: Secondary | ICD-10-CM

## 2020-05-02 ENCOUNTER — Encounter: Payer: Self-pay | Admitting: Internal Medicine

## 2020-05-02 NOTE — Telephone Encounter (Signed)
Please decline.  Last office visit 10/28/19.

## 2020-05-05 ENCOUNTER — Telehealth: Payer: Self-pay | Admitting: Internal Medicine

## 2020-05-05 ENCOUNTER — Other Ambulatory Visit: Payer: Self-pay | Admitting: *Deleted

## 2020-05-05 DIAGNOSIS — F988 Other specified behavioral and emotional disorders with onset usually occurring in childhood and adolescence: Secondary | ICD-10-CM

## 2020-05-05 MED ORDER — AMPHETAMINE-DEXTROAMPHETAMINE 10 MG PO TABS: 10.0000 mg | ORAL_TABLET | Freq: Every day | ORAL | 0 refills | Status: DC

## 2020-05-05 MED ORDER — AMPHETAMINE-DEXTROAMPHET ER 20 MG PO CP24: ORAL_CAPSULE | ORAL | 0 refills | Status: DC

## 2020-05-11 ENCOUNTER — Encounter: Payer: Self-pay | Admitting: Internal Medicine

## 2020-05-11 ENCOUNTER — Other Ambulatory Visit: Payer: Self-pay

## 2020-05-11 ENCOUNTER — Telehealth (INDEPENDENT_AMBULATORY_CARE_PROVIDER_SITE_OTHER): Payer: BC Managed Care – PPO | Admitting: Internal Medicine

## 2020-05-11 VITALS — Wt 206.0 lb

## 2020-05-11 DIAGNOSIS — R030 Elevated blood-pressure reading, without diagnosis of hypertension: Secondary | ICD-10-CM

## 2020-05-11 DIAGNOSIS — F988 Other specified behavioral and emotional disorders with onset usually occurring in childhood and adolescence: Secondary | ICD-10-CM | POA: Diagnosis not present

## 2020-05-11 MED ORDER — AMPHETAMINE-DEXTROAMPHET ER 20 MG PO CP24: 20.0000 mg | ORAL_CAPSULE | Freq: Every day | ORAL | 0 refills | Status: DC

## 2020-05-11 MED ORDER — AMPHETAMINE-DEXTROAMPHETAMINE 10 MG PO TABS: 10.0000 mg | ORAL_TABLET | Freq: Every day | ORAL | 0 refills | Status: DC

## 2020-05-11 NOTE — Progress Notes (Signed)
Virtual Visit via Video Note  I connected with Carrie [name] on 05/11/20 at  7:00 AM EST by a video enabled telemedicine application and verified that I am speaking with the correct person using two identifiers.  Location patient: home Location provider: work office Persons participating in the virtual visit: patient, provider  I discussed the limitations of evaluation and management by telemedicine and the availability of in person appointments. The patient expressed understanding and agreed to proceed.   HPI: She has scheduled this visit for 2 reasons:  1.  She is due for Adderall refills and per contract needed visit.  2.  She is requesting an updated handicap placard due to the severe nature of her asthma.  She has started a new job where her assigned parking spot is 5 blocks from her office building in downtown [place].   ROS: Constitutional: Denies fever, chills, diaphoresis, appetite change and fatigue.  HEENT: Denies photophobia, eye pain, redness, hearing loss, ear pain, congestion, sore throat, rhinorrhea, sneezing, mouth sores, trouble swallowing, neck pain, neck stiffness and tinnitus.   Respiratory: Denies SOB, DOE, cough, chest tightness,  and wheezing.   Cardiovascular: Denies chest pain, palpitations and leg swelling.  Gastrointestinal: Denies nausea, vomiting, abdominal pain, diarrhea, constipation, blood in stool and abdominal distention.  Genitourinary: Denies dysuria, urgency, frequency, hematuria, flank pain and difficulty urinating.  Endocrine: Denies: hot or cold intolerance, sweats, changes in hair or nails, polyuria, polydipsia. Musculoskeletal: Denies myalgias, back pain, joint swelling, arthralgias and gait problem.  Skin: Denies pallor, rash and wound.  Neurological: Denies dizziness, seizures, syncope, weakness, light-headedness, numbness and headaches.  Hematological: Denies adenopathy. Easy bruising, personal or family bleeding history   Psychiatric/Behavioral: Denies suicidal ideation, mood changes, confusion, nervousness, sleep disturbance and agitation   Past Medical History:  Diagnosis Date  . A-fib ([place])   . ADD (attention deficit disorder)   . Adrenal abnormality ([place])   . Anemia, iron deficiency   . Asthma   . Diabetes mellitus without complication ([place])   . Fibromyalgia   . Hiatal hernia   . Numbness and tingling   . Obese   . Thyroid disease     Past Surgical History:  Procedure Laterality Date  . ABDOMINAL HYSTERECTOMY    . KNEE SURGERY    . SHOULDER SURGERY    . TOTAL KNEE ARTHROPLASTY Left 01/15/2019   Procedure: TOTAL KNEE ARTHROPLASTY;  Surgeon: Sydnee Cabal, MD;  Location: [facility];  Service: Orthopedics;  Laterality: Left;    Family History  Problem Relation Age of Onset  . Asthma Mother        seasonal  . Colon cancer Mother   . Lung cancer Maternal Uncle   . Esophageal cancer Father        smoker, mets to lungs  . Diabetes Sister     SOCIAL HX:   reports that she has never smoked. She has never used smokeless tobacco. She reports current alcohol use. She reports that she does not use drugs.   Current Outpatient Medications:  .  albuterol (PROAIR HFA) 108 (90 Base) MCG/ACT inhaler, USE 2 INHALATIONS EVERY 6 HOURS AS NEEDED, Disp: 18 g, Rfl: 2 .  albuterol (PROVENTIL) (2.5 MG/3ML) 0.083% nebulizer solution, USE 1 VIAL VIA NEBULIZER EVERY 4 HOURS AS NEEDED FOR WHEEZE/SHORTNESS OF BREATH (Patient taking differently: Take 2.5 mg by nebulization every 4 (four) hours as needed for wheezing or shortness of breath.), Disp: 75 mL, Rfl: 6 .  amphetamine-dextroamphetamine (ADDERALL XR) 20 MG  24 hr capsule, Take 1 capsule (20 mg total) by mouth daily., Disp: 30 capsule, Rfl: 0 .  amphetamine-dextroamphetamine (ADDERALL XR) 20 MG 24 hr capsule, Take 1 capsule (20 mg total) by mouth daily., Disp: 30 capsule, Rfl: 0 .  amphetamine-dextroamphetamine (ADDERALL XR) 20 MG 24 hr capsule, Take 1 capsule  (20 mg total) by mouth daily., Disp: 30 capsule, Rfl: 0 .  amphetamine-dextroamphetamine (ADDERALL) 10 MG tablet, Take 1 tablet (10 mg total) by mouth daily., Disp: 30 tablet, Rfl: 0 .  amphetamine-dextroamphetamine (ADDERALL) 10 MG tablet, Take 1 tablet (10 mg total) by mouth daily., Disp: 30 tablet, Rfl: 0 .  amphetamine-dextroamphetamine (ADDERALL) 10 MG tablet, Take 1 tablet (10 mg total) by mouth daily., Disp: 30 tablet, Rfl: 0 .  atorvastatin (LIPITOR) 20 MG tablet, TAKE 1 TABLET(20 MG) BY MOUTH DAILY, Disp: 90 tablet, Rfl: 1 .  budesonide-formoterol (SYMBICORT) 160-4.5 MCG/ACT inhaler, Inhale 2 puffs into the lungs 2 (two) times daily., Disp: 1 Inhaler, Rfl: 2 .  levothyroxine (SYNTHROID) 75 MCG tablet, Take 75 mcg by mouth daily before breakfast., Disp: , Rfl:  .  montelukast (SINGULAIR) 10 MG tablet, Take 1 tablet (10 mg total) by mouth at bedtime., Disp: 30 tablet, Rfl: 11 .  pantoprazole (PROTONIX) 40 MG tablet, Take 30-60 min before first meal of the day, Disp: 90 tablet, Rfl: 11  EXAM:   VITALS per patient if applicable: She states blood pressures have been well controlled in the 120/60 range.  GENERAL: alert, oriented, appears well and in no acute distress  HEENT: atraumatic, conjunttiva clear, no obvious abnormalities on inspection of external nose and ears  NECK: normal movements of the head and neck  LUNGS: on inspection no signs of respiratory distress, breathing rate appears normal, no obvious gross increased work of breathing, gasping or wheezing  CV: no obvious cyanosis  MS: moves all visible extremities without noticeable abnormality  PSYCH/NEURO: pleasant and cooperative, no obvious depression or anxiety, speech and thought processing grossly intact  ASSESSMENT AND PLAN:   Attention deficit disorder, unspecified hyperactivity presence -PDMP reviewed, no red flags, overdose risk score is 110. -Refill Adderall XR 20 mg and Adderall immediate release 10 mg of  which she takes 1 tablet each daily.  I have prescribed 30 tablets of each with 3 refills.  Elevated BP without diagnosis of hypertension -Blood pressure was noted to be elevated at last visit. -She states that recent doctor office asked and at home blood pressures usually around the 120/60 range, continue to monitor.  Handicap placard will be filled out today for her to pick up.  She is overdue for physical with lab work, she has not had an A1c in over a year.  She is a well-controlled diabetic not on medications.  Time spent: 31 minutes reviewing chart, discussing plan of care and follow-up plans.     I discussed the assessment and treatment plan with the patient. The patient was provided an opportunity to ask questions and all were answered. The patient agreed with the plan and demonstrated an understanding of the instructions.   The patient was advised to call back or seek an in-person evaluation if the symptoms worsen or if the condition fails to improve as anticipated.    Lelon Frohlich, MD  [facility] at [facility]

## 2020-05-19 ENCOUNTER — Ambulatory Visit: Payer: BC Managed Care – PPO | Admitting: *Deleted

## 2020-05-19 ENCOUNTER — Other Ambulatory Visit: Payer: Self-pay

## 2020-05-19 VITALS — Ht 63.0 in | Wt 203.0 lb

## 2020-05-19 DIAGNOSIS — Z800 Family history of malignant neoplasm of digestive organs: Secondary | ICD-10-CM

## 2020-05-19 DIAGNOSIS — E119 Type 2 diabetes mellitus without complications: Secondary | ICD-10-CM | POA: Insufficient documentation

## 2020-05-19 MED ORDER — PLENVU 140 G PO SOLR: 1.0000 | Freq: Once | ORAL | 0 refills | Status: AC

## 2020-05-19 NOTE — Progress Notes (Signed)
No egg or soy allergy known to patient  No issues with past sedation with any surgeries or procedures No intubation problems in the past  No FH of Malignant Hyperthermia No diet pills per patient No home 02 use per patient  No blood thinners per patient  Pt denies issues with constipation  No A fib or A flutter  EMMI video to pt or via [place] 19 guidelines implemented in PV today with Pt and RN  Pt is fully vaccinated  for Covid  Pt denies loose or missing teeth, denies dentures, partials, dental implants, capped or bonded teeth  Coupon given to pt in PV today , Code to Pharmacy and  NO PA's for preps discussed with pt In PV today  Discussed with pt there will be an out-of-pocket cost for prep and that varies from $0 to 70 dollars   Due to the COVID-19 pandemic we are asking patients to follow certain guidelines.  Pt aware of COVID protocols and [facility] guidelines

## 2020-05-22 ENCOUNTER — Encounter: Payer: Self-pay | Admitting: Gastroenterology

## 2020-06-02 ENCOUNTER — Ambulatory Visit (AMBULATORY_SURGERY_CENTER): Payer: BC Managed Care – PPO | Admitting: Gastroenterology

## 2020-06-02 ENCOUNTER — Encounter: Payer: Self-pay | Admitting: Gastroenterology

## 2020-06-02 ENCOUNTER — Other Ambulatory Visit: Payer: Self-pay

## 2020-06-02 VITALS — BP 145/79 | HR 89 | Temp 96.2°F | Resp 16 | Ht 62.0 in | Wt 203.0 lb

## 2020-06-02 DIAGNOSIS — Z800 Family history of malignant neoplasm of digestive organs: Secondary | ICD-10-CM

## 2020-06-02 DIAGNOSIS — Z1211 Encounter for screening for malignant neoplasm of colon: Secondary | ICD-10-CM

## 2020-06-02 MED ORDER — SODIUM CHLORIDE 0.9 % IV SOLN
500.0000 mL | Freq: Once | INTRAVENOUS | Status: DC
Start: 2020-06-02 — End: 2020-06-02

## 2020-06-02 NOTE — Progress Notes (Signed)
PT taken to PACU. Monitors in place. VSS. Report given to RN. 

## 2020-06-02 NOTE — Progress Notes (Signed)
Pt's states no medical or surgical changes since previsit or office visit. VS by JD

## 2020-06-02 NOTE — Patient Instructions (Signed)
Resume previous diet Continue current medications Purchase preparation H suppository over the counter rectally as needed Hemorrhoidal banding info given  YOU HAD AN ENDOSCOPIC PROCEDURE TODAY AT [facility]:   Refer to the procedure report that was given to you for any specific questions about what was found during the examination.  If the procedure report does not answer your questions, please call your gastroenterologist to clarify.  If you requested that your care partner not be given the details of your procedure findings, then the procedure report has been included in a sealed envelope for you to review at your convenience later.  YOU SHOULD EXPECT: Some feelings of bloating in the abdomen. Passage of more gas than usual.  Walking can help get rid of the air that was put into your GI tract during the procedure and reduce the bloating. If you had a lower endoscopy (such as a colonoscopy or flexible sigmoidoscopy) you may notice spotting of blood in your stool or on the toilet paper. If you underwent a bowel prep for your procedure, you may not have a normal bowel movement for a few days.  Please Note:  You might notice some irritation and congestion in your nose or some drainage.  This is from the oxygen used during your procedure.  There is no need for concern and it should clear up in a day or so.  SYMPTOMS TO REPORT IMMEDIATELY:   Following lower endoscopy (colonoscopy or flexible sigmoidoscopy):  Excessive amounts of blood in the stool  Significant tenderness or worsening of abdominal pains  Swelling of the abdomen that is new, acute  Fever of 100F or higher  For urgent or emergent issues, a gastroenterologist can be reached at any hour by calling [phone]. Do not use MyChart messaging for urgent concerns.   DIET:  We do recommend a small meal at first, but then you may proceed to your regular diet.  Drink plenty of fluids but you should avoid alcoholic  beverages for 24 hours.  ACTIVITY:  You should plan to take it easy for the rest of today and you should NOT DRIVE or use heavy machinery until tomorrow (because of the sedation medicines used during the test).    FOLLOW UP: Our staff will call the number listed on your records 48-72 hours following your procedure to check on you and address any questions or concerns that you may have regarding the information given to you following your procedure. If we do not reach you, we will leave a message.  We will attempt to reach you two times.  During this call, we will ask if you have developed any symptoms of COVID 19. If you develop any symptoms (ie: fever, flu-like symptoms, shortness of breath, cough etc.) before then, please call [phone].  If you test positive for Covid 19 in the 2 weeks post procedure, please call and report this information to [place].    If any biopsies were taken you will be contacted by phone or by letter within the next 1-3 weeks.  Please call us at [phone] if you have not heard about the biopsies in 3 weeks.   SIGNATURES/CONFIDENTIALITY: You and/or your care partner have signed paperwork which will be entered into your electronic medical record.  These signatures attest to the fact that that the information above on your After Visit Summary has been reviewed and is understood.  Full responsibility of the confidentiality of this discharge information lies with you and/or your care-partner.

## 2020-06-02 NOTE — Op Note (Signed)
[facility] Patient Name: Carrie [name] Procedure Date: 06/02/2020 7:22 AM MRN: [id number] Endoscopist: Mauri Pole , MD Age: 60 Referring MD:  Date of Birth: 02/01/1961 Gender: Female Account #: [id number] Procedure:                Colonoscopy Indications:              Screening for colon cancer: Family history of                            colorectal cancer in distant relative(s) Medicines:                Monitored Anesthesia Care Procedure:                Pre-Anesthesia Assessment:                           - Prior to the procedure, a History and Physical                            was performed, and patient medications and                            allergies were reviewed. The patient's tolerance of                            previous anesthesia was also reviewed. The risks                            and benefits of the procedure and the sedation                            options and risks were discussed with the patient.                            All questions were answered, and informed consent                            was obtained. Prior Anticoagulants: The patient has                            taken no previous anticoagulant or antiplatelet                            agents. ASA Grade Assessment: II - A patient with                            mild systemic disease. After reviewing the risks                            and benefits, the patient was deemed in                            satisfactory condition to undergo the procedure.  After obtaining informed consent, the colonoscope                            was passed under direct vision. Throughout the                            procedure, the patient's blood pressure, pulse, and                            oxygen saturations were monitored continuously. The                            Olympus [id number]-[id number] (#[id number]) Colonoscope was                            introduced through the  anus and advanced to the the                            cecum, identified by appendiceal orifice and                            ileocecal valve. The colonoscopy was performed                            without difficulty. The patient tolerated the                            procedure well. The quality of the bowel                            preparation was excellent. The ileocecal valve,                            appendiceal orifice, and rectum were photographed. Scope In: 8:08:39 AM Scope Out: 8:26:50 AM Scope Withdrawal Time: 0 hours 12 minutes 28 seconds  Total Procedure Duration: 0 hours 18 minutes 11 seconds  Findings:                 The perianal and digital rectal examinations were                            normal.                           External and internal hemorrhoids were found during                            retroflexion. The hemorrhoids were medium-sized.                           The exam was otherwise without abnormality. Complications:            No immediate complications. Estimated Blood Loss:     Estimated blood loss was minimal. Impression:               - External and internal hemorrhoids.                           -  The examination was otherwise normal.                           - No specimens collected. Recommendation:           - Patient has a contact number available for                            emergencies. The signs and symptoms of potential                            delayed complications were discussed with the                            patient. Return to normal activities tomorrow.                            Written discharge instructions were provided to the                            patient.                           - Resume previous diet.                           - Continue present medications.                           - Repeat colonoscopy in 5 years for screening                            purposes.                           - Preparation  H suppository: Insert rectally as                            necessary.                           - Consider hemorrhoidal band ligation, call to                            schedule appointment. Mauri Pole, MD 06/02/2020 8:32:40 AM This report has been signed electronically.

## 2020-06-06 ENCOUNTER — Telehealth: Payer: Self-pay

## 2020-06-06 NOTE — Telephone Encounter (Signed)
  Follow up Call-  Call back number 06/02/2020  Post procedure Call Back phone  # [phone]  Permission to leave phone message Yes  Some recent data might be hidden     Patient questions:  Do you have a fever, pain , or abdominal swelling? No. Pain Score  0 *  Have you tolerated food without any problems? Yes.    Have you been able to return to your normal activities? Yes.    Do you have any questions about your discharge instructions: Diet   No. Medications  No. Follow up visit  No.  Do you have questions or concerns about your Care? No.  Actions: * If pain score is 4 or above: No action needed, pain <4. 1. Have you developed a fever since your procedure? no  2.   Have you had an respiratory symptoms (SOB or cough) since your procedure? no  3.   Have you tested positive for COVID 19 since your procedure no  4.   Have you had any family members/close contacts diagnosed with the COVID 19 since your procedure?  no   If yes to any of these questions please route to Joylene John, RN and Joella Prince, RN

## 2020-06-14 DIAGNOSIS — J4530 Mild persistent asthma, uncomplicated: Secondary | ICD-10-CM

## 2020-06-14 MED ORDER — MONTELUKAST SODIUM 10 MG PO TABS: 10.0000 mg | ORAL_TABLET | Freq: Every day | ORAL | 11 refills | Status: DC

## 2020-06-14 MED ORDER — ALBUTEROL SULFATE HFA 108 (90 BASE) MCG/ACT IN AERS: INHALATION_SPRAY | RESPIRATORY_TRACT | 2 refills | Status: DC

## 2020-06-14 MED ORDER — PANTOPRAZOLE SODIUM 40 MG PO TBEC: DELAYED_RELEASE_TABLET | ORAL | 11 refills | Status: DC

## 2020-06-14 MED ORDER — BUDESONIDE-FORMOTEROL FUMARATE 160-4.5 MCG/ACT IN AERO: 2.0000 | INHALATION_SPRAY | Freq: Two times a day (BID) | RESPIRATORY_TRACT | 2 refills | Status: DC

## 2020-07-21 ENCOUNTER — Other Ambulatory Visit: Payer: Self-pay | Admitting: Internal Medicine

## 2020-07-21 DIAGNOSIS — E1169 Type 2 diabetes mellitus with other specified complication: Secondary | ICD-10-CM

## 2020-07-26 IMAGING — MG DIGITAL SCREENING BILAT W/ TOMO W/ CAD
8 series · 8 of 24 positions shown · non-contrast
Comparison: Previous exam(s).

CLINICAL DATA: Screening.

EXAM:
DIGITAL SCREENING BILATERAL MAMMOGRAM WITH TOMO AND CAD

[L CC synth-2D]
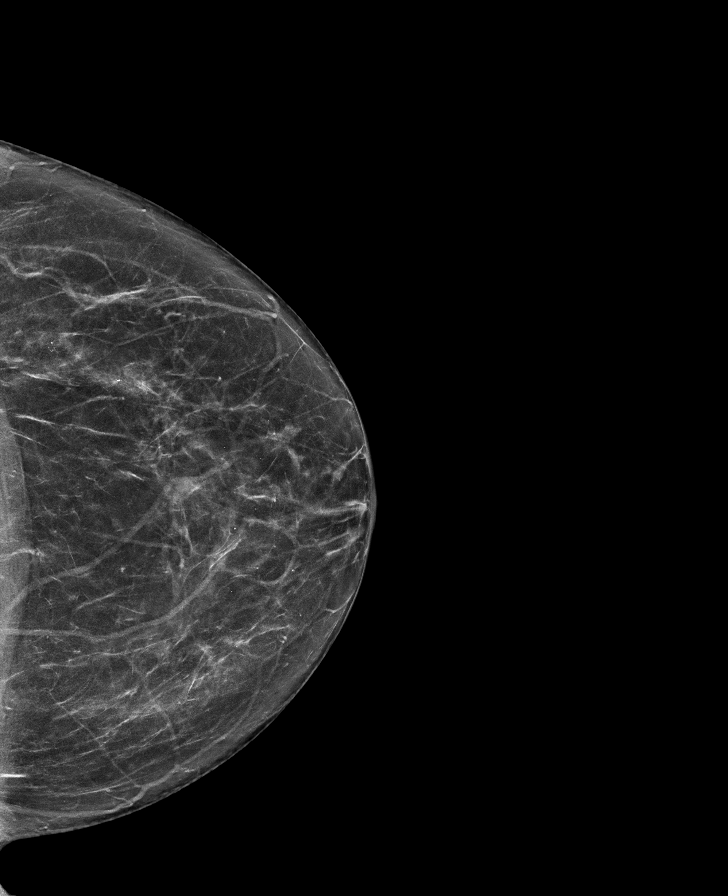

[R MLO synth-2D]
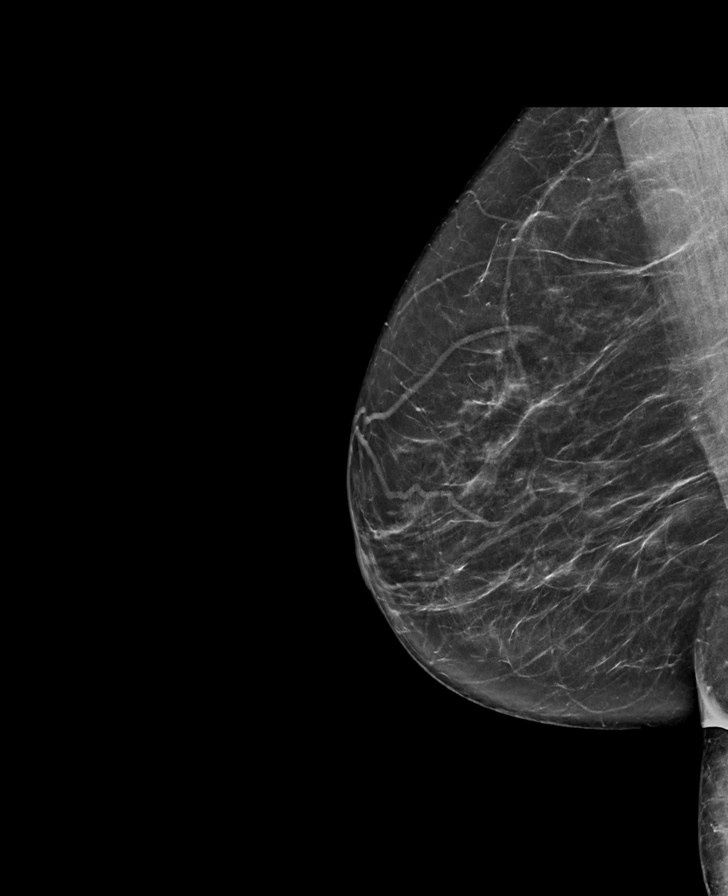

[R CC synth-2D]
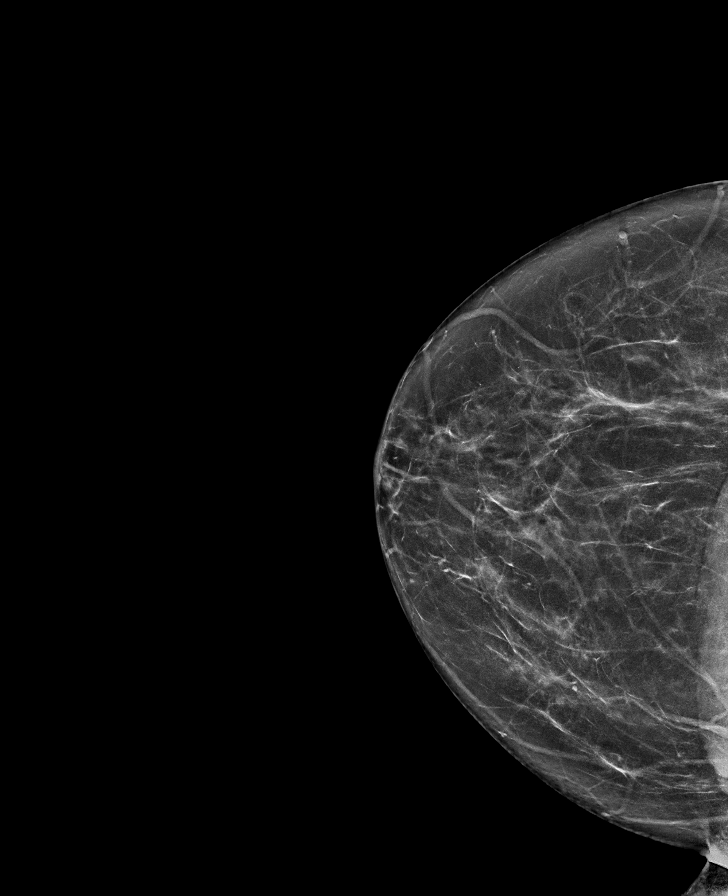

[L MLO synth-2D]
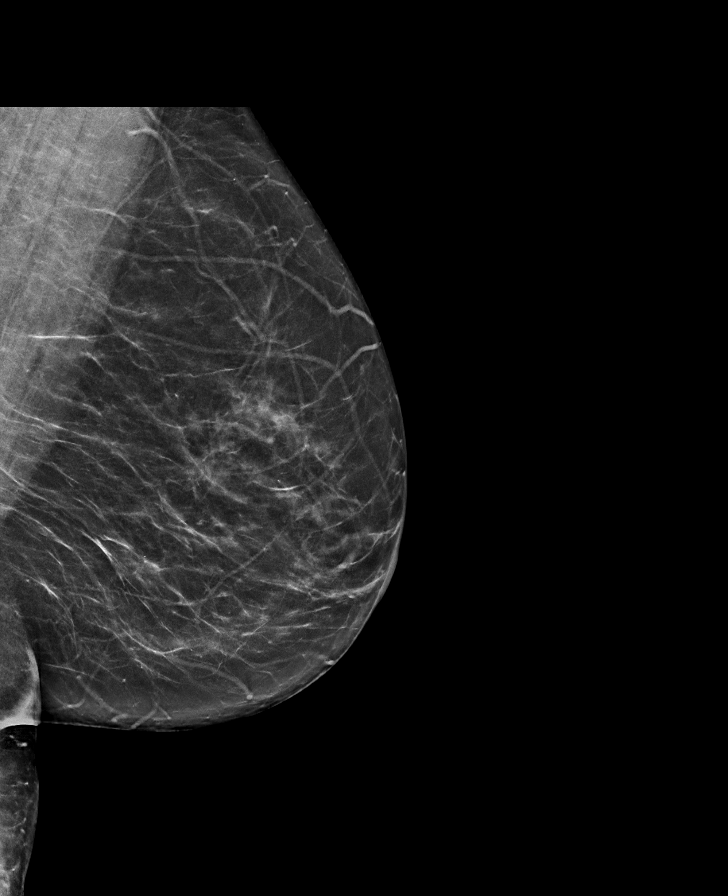

[R CC tomo · tomo slice 33/65.0]
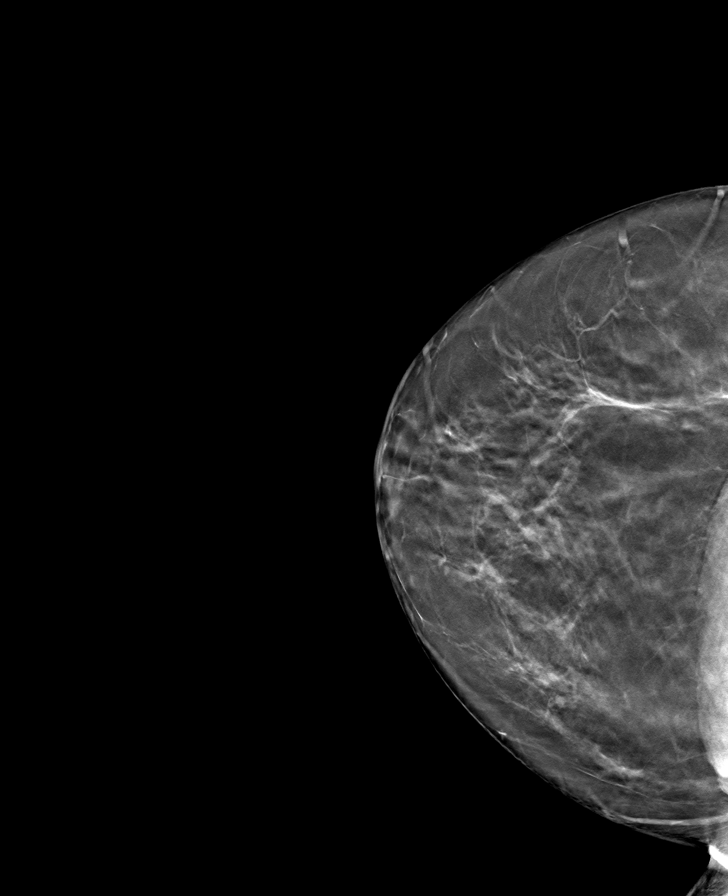

[R MLO tomo · tomo slice 36/71.0]
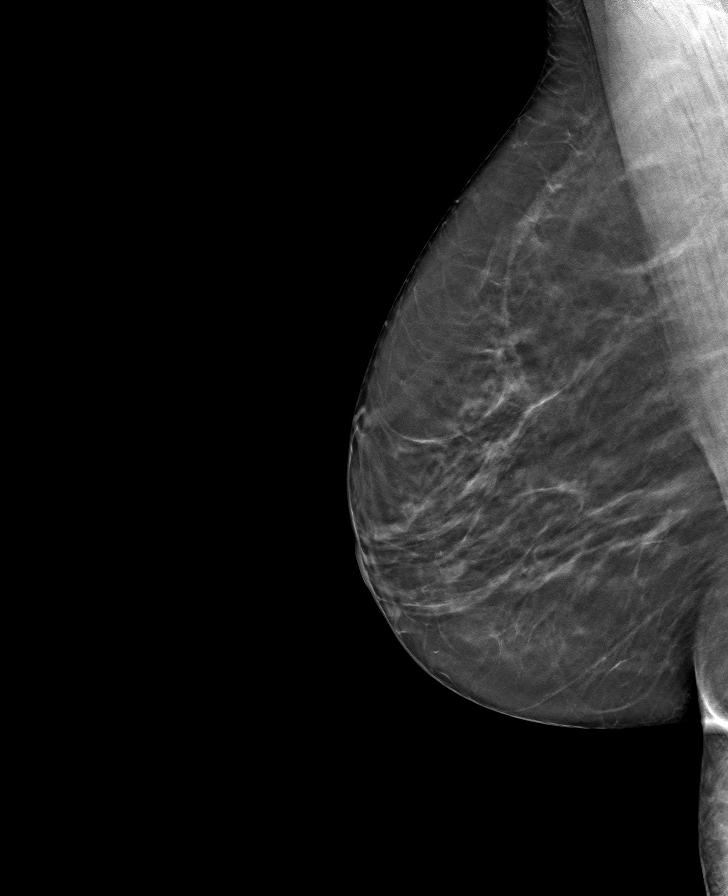

[L MLO tomo · tomo slice 37/72.0]
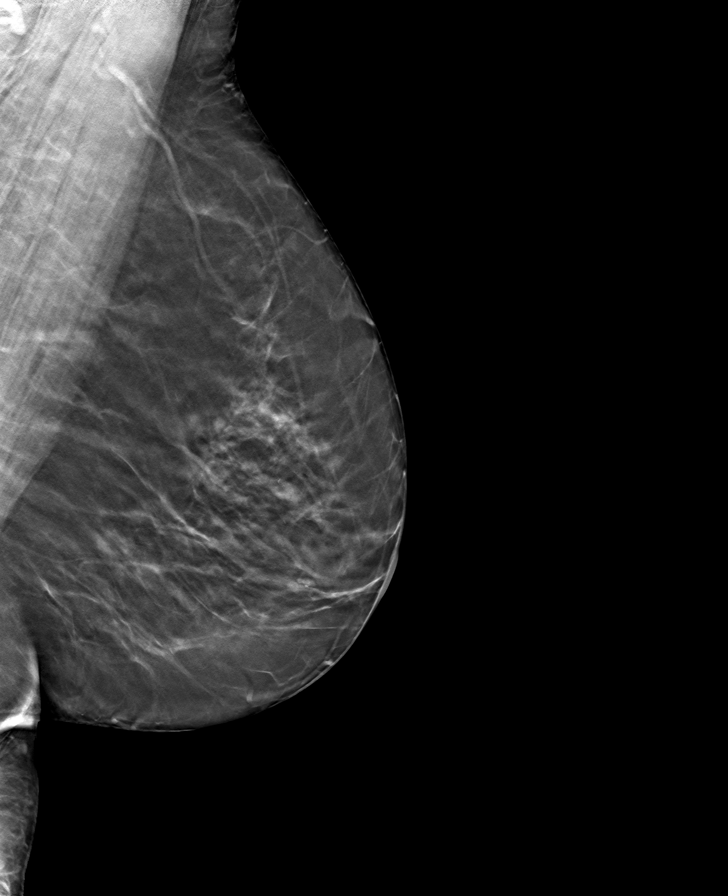

[L CC tomo · tomo slice 35/68.0]
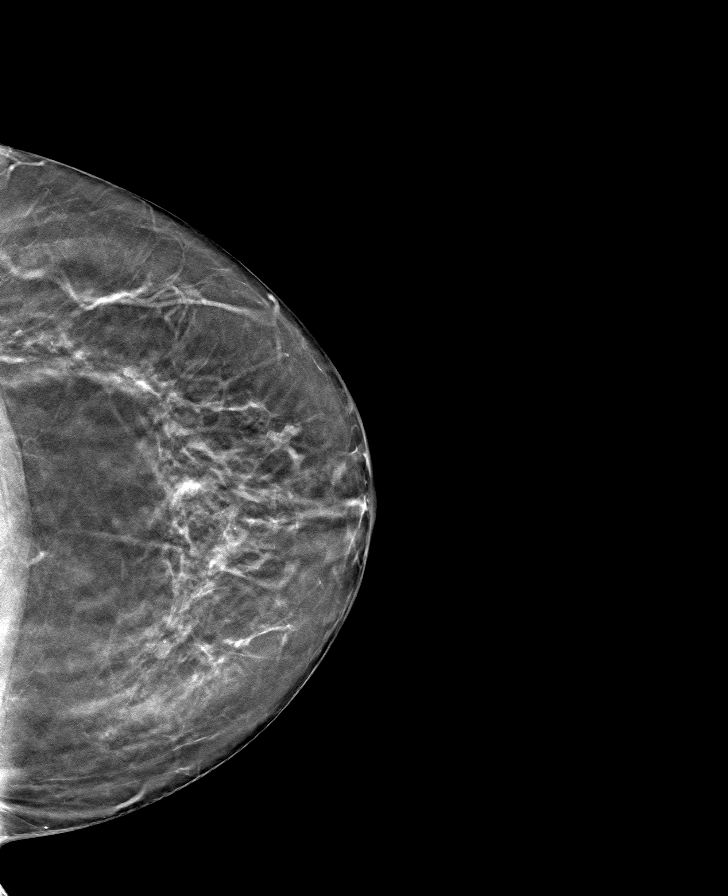

[8 of 24 positions shown; findings below may reference images not displayed]

ACR Breast Density Category b: There are scattered areas of
fibroglandular density.
FINDINGS: There are no findings suspicious for malignancy. Images were
processed with CAD.
IMPRESSION: No mammographic evidence of malignancy. A result letter of this
screening mammogram will be mailed directly to the patient.

RECOMMENDATION:
Screening mammogram in one year. (Code:[id number])

BI-RADS CATEGORY  1: Negative.

## 2020-08-08 DIAGNOSIS — E2749 Other adrenocortical insufficiency: Secondary | ICD-10-CM | POA: Diagnosis not present

## 2020-08-08 DIAGNOSIS — E7800 Pure hypercholesterolemia, unspecified: Secondary | ICD-10-CM | POA: Diagnosis not present

## 2020-08-08 DIAGNOSIS — E2740 Unspecified adrenocortical insufficiency: Secondary | ICD-10-CM | POA: Diagnosis not present

## 2020-08-08 DIAGNOSIS — E119 Type 2 diabetes mellitus without complications: Secondary | ICD-10-CM | POA: Diagnosis not present

## 2020-08-08 LAB — BASIC METABOLIC PANEL: Creatinine: 0.7 (ref 0.5–1.1)

## 2020-08-08 LAB — HEMOGLOBIN A1C: Hemoglobin A1C: 6.5

## 2020-08-08 LAB — VITAMIN D 25 HYDROXY (VIT D DEFICIENCY, FRACTURES): Vit D, 25-Hydroxy: 53

## 2020-08-08 LAB — LIPID PANEL: LDL Cholesterol: 75

## 2020-08-08 LAB — TSH: TSH: 2.45 (ref 0.41–5.90)

## 2020-08-08 LAB — MICROALBUMIN, URINE: Microalb, Ur: 0.6

## 2020-08-09 DIAGNOSIS — M67422 Ganglion, left elbow: Secondary | ICD-10-CM | POA: Diagnosis not present

## 2020-08-15 DIAGNOSIS — E7800 Pure hypercholesterolemia, unspecified: Secondary | ICD-10-CM | POA: Diagnosis not present

## 2020-08-15 DIAGNOSIS — E119 Type 2 diabetes mellitus without complications: Secondary | ICD-10-CM | POA: Diagnosis not present

## 2020-08-15 DIAGNOSIS — E039 Hypothyroidism, unspecified: Secondary | ICD-10-CM | POA: Diagnosis not present

## 2020-08-15 DIAGNOSIS — E2749 Other adrenocortical insufficiency: Secondary | ICD-10-CM | POA: Diagnosis not present

## 2020-08-29 ENCOUNTER — Other Ambulatory Visit: Payer: Self-pay | Admitting: Obstetrics and Gynecology

## 2020-08-29 ENCOUNTER — Other Ambulatory Visit: Payer: Self-pay

## 2020-08-29 ENCOUNTER — Ambulatory Visit (INDEPENDENT_AMBULATORY_CARE_PROVIDER_SITE_OTHER): Payer: BC Managed Care – PPO

## 2020-08-29 ENCOUNTER — Other Ambulatory Visit: Payer: Self-pay | Admitting: Obstetrics & Gynecology

## 2020-08-29 DIAGNOSIS — Z780 Asymptomatic menopausal state: Secondary | ICD-10-CM | POA: Diagnosis not present

## 2020-08-29 DIAGNOSIS — Z13820 Encounter for screening for osteoporosis: Secondary | ICD-10-CM

## 2020-08-29 DIAGNOSIS — Z01419 Encounter for gynecological examination (general) (routine) without abnormal findings: Secondary | ICD-10-CM

## 2020-09-19 ENCOUNTER — Other Ambulatory Visit: Payer: Self-pay | Admitting: Internal Medicine

## 2020-09-19 DIAGNOSIS — F988 Other specified behavioral and emotional disorders with onset usually occurring in childhood and adolescence: Secondary | ICD-10-CM

## 2020-09-19 NOTE — Telephone Encounter (Signed)
Patient has an appointment 10/04/20 Okay to fill?

## 2020-09-20 MED ORDER — AMPHETAMINE-DEXTROAMPHET ER 20 MG PO CP24: 20.0000 mg | ORAL_CAPSULE | Freq: Every day | ORAL | 0 refills | Status: DC

## 2020-10-03 ENCOUNTER — Other Ambulatory Visit: Payer: Self-pay

## 2020-10-04 ENCOUNTER — Ambulatory Visit (INDEPENDENT_AMBULATORY_CARE_PROVIDER_SITE_OTHER): Payer: BC Managed Care – PPO | Admitting: Internal Medicine

## 2020-10-04 ENCOUNTER — Encounter: Payer: Self-pay | Admitting: Internal Medicine

## 2020-10-04 VITALS — BP 126/80 | HR 83 | Temp 97.9°F | Ht 62.0 in | Wt 203.4 lb

## 2020-10-04 DIAGNOSIS — E785 Hyperlipidemia, unspecified: Secondary | ICD-10-CM

## 2020-10-04 DIAGNOSIS — F988 Other specified behavioral and emotional disorders with onset usually occurring in childhood and adolescence: Secondary | ICD-10-CM

## 2020-10-04 DIAGNOSIS — E119 Type 2 diabetes mellitus without complications: Secondary | ICD-10-CM | POA: Diagnosis not present

## 2020-10-04 DIAGNOSIS — E1169 Type 2 diabetes mellitus with other specified complication: Secondary | ICD-10-CM

## 2020-10-04 DIAGNOSIS — E559 Vitamin D deficiency, unspecified: Secondary | ICD-10-CM

## 2020-10-04 MED ORDER — AMPHETAMINE-DEXTROAMPHETAMINE 10 MG PO TABS: 10.0000 mg | ORAL_TABLET | Freq: Two times a day (BID) | ORAL | 0 refills | Status: DC

## 2020-10-04 NOTE — Progress Notes (Signed)
Established Patient Office Visit     This visit occurred during the SARS-CoV-2 public health emergency.  Safety protocols were in place, including screening questions prior to the visit, additional usage of staff PPE, and extensive cleaning of exam room while observing appropriate contact time as indicated for disinfecting solutions.    CC/Reason for Visit: Follow-up chronic conditions, medication refills  HPI: Carrie [name] is a 60 y.o. female who is coming in today for the above mentioned reasons. Past Medical History is significant for: Type 2 diabetes followed by Dr. York Spaniel who is currently off all diabetic medication, hyperlipidemia not on statin therapy due to history of transaminitis, vitamin D deficiency with recently normal vitamin D levels.  She is here for Adderall refills per protocol.  She has been taking 20 mg XR in the morning and 10 mg of immediate release in the afternoon.  She is requesting change to 10 mg twice daily.  She had labs done with her endocrinologist in May.  Her creatinine at that time was 0.68, A1c 6.5, microalbumin 0.6, TSH 2.45, LDL 75 and vitamin D was 53.  These have been abstracted into epic.  We had some preliminary discussion about weight loss medication.  She would like to try lifestyle modifications for now, but would like to discuss this in the fall.    Past Medical/Surgical History: Past Medical History:  Diagnosis Date   A-fib ([place])    ADD (attention deficit disorder)    Adrenal abnormality ([facility])    Anemia, iron deficiency    Asthma    Fibromyalgia    GERD (gastroesophageal reflux disease)    Hiatal hernia    Hyperlipidemia    Numbness and tingling    Obese    Thyroid disease     Past Surgical History:  Procedure Laterality Date   ABDOMINAL HYSTERECTOMY     COLONOSCOPY     KNEE SURGERY     SHOULDER SURGERY     TONSILLECTOMY     TOTAL KNEE ARTHROPLASTY Left 01/15/2019   Procedure: TOTAL KNEE ARTHROPLASTY;  Surgeon: Sydnee Cabal, MD;  Location: [facility];  Service: Orthopedics;  Laterality: Left;    Social History:  reports that she has never smoked. She has never used smokeless tobacco. She reports current alcohol use. She reports that she does not use drugs.  Allergies: Allergies  Allergen Reactions   Aspirin     REACTION: asthma- on respirator for 28 days   Codeine Phosphate Hives   Penicillins Hives    REACTION: wheezing and hives  Did it involve swelling of the face/tongue/throat, SOB, or low BP? no Did it involve sudden or severe rash/hives, skin peeling, or any reaction on the inside of your mouth or nose? No Did you need to seek medical attention at a hospital or doctor's office? no When did it last happen?      Childhood If all above answers are "NO", may proceed with cephalosporin use.    Family History:  Family History  Problem Relation Age of Onset   Asthma Mother        seasonal   Colon cancer Mother    Lung cancer Maternal Uncle    Esophageal cancer Father        smoker, mets to lungs   Colon polyps Father    Diabetes Sister    Rectal cancer Neg Hx    Stomach cancer Neg Hx      Current Outpatient Medications:    albuterol (  PROAIR HFA) 108 (90 Base) MCG/ACT inhaler, USE 2 INHALATIONS EVERY 6 HOURS AS NEEDED, Disp: 18 g, Rfl: 2   albuterol (PROVENTIL) (2.5 MG/3ML) 0.083% nebulizer solution, USE 1 VIAL VIA NEBULIZER EVERY 4 HOURS AS NEEDED FOR WHEEZE/SHORTNESS OF BREATH (Patient taking differently: Take 2.5 mg by nebulization every 4 (four) hours as needed for wheezing or shortness of breath.), Disp: 75 mL, Rfl: 6   amphetamine-dextroamphetamine (ADDERALL) 10 MG tablet, Take 1 tablet (10 mg total) by mouth 2 (two) times daily., Disp: 60 tablet, Rfl: 0   amphetamine-dextroamphetamine (ADDERALL) 10 MG tablet, Take 1 tablet (10 mg total) by mouth 2 (two) times daily., Disp: 60 tablet, Rfl: 0   atorvastatin (LIPITOR) 20 MG tablet, TAKE 1 TABLET(20 MG) BY MOUTH DAILY, Disp: 90 tablet,  Rfl: 0   budesonide-formoterol (SYMBICORT) 160-4.5 MCG/ACT inhaler, Inhale 2 puffs into the lungs 2 (two) times daily., Disp: 1 each, Rfl: 2   levothyroxine (SYNTHROID) 75 MCG tablet, Take 75 mcg by mouth daily before breakfast., Disp: , Rfl:    montelukast (SINGULAIR) 10 MG tablet, Take 1 tablet (10 mg total) by mouth at bedtime., Disp: 30 tablet, Rfl: 11   pantoprazole (PROTONIX) 40 MG tablet, Take 30-60 min before first meal of the day, Disp: 90 tablet, Rfl: 11   amphetamine-dextroamphetamine (ADDERALL) 10 MG tablet, Take 1 tablet (10 mg total) by mouth 2 (two) times daily with a meal., Disp: 60 tablet, Rfl: 0  Review of Systems:  Constitutional: Denies fever, chills, diaphoresis, appetite change and fatigue.  HEENT: Denies photophobia, eye pain, redness, hearing loss, ear pain, congestion, sore throat, rhinorrhea, sneezing, mouth sores, trouble swallowing, neck pain, neck stiffness and tinnitus.   Respiratory: Denies SOB, DOE, cough, chest tightness,  and wheezing.   Cardiovascular: Denies chest pain, palpitations and leg swelling.  Gastrointestinal: Denies nausea, vomiting, abdominal pain, diarrhea, constipation, blood in stool and abdominal distention.  Genitourinary: Denies dysuria, urgency, frequency, hematuria, flank pain and difficulty urinating.  Endocrine: Denies: hot or cold intolerance, sweats, changes in hair or nails, polyuria, polydipsia. Musculoskeletal: Denies myalgias, back pain, joint swelling, arthralgias and gait problem.  Skin: Denies pallor, rash and wound.  Neurological: Denies dizziness, seizures, syncope, weakness, light-headedness, numbness and headaches.  Hematological: Denies adenopathy. Easy bruising, personal or family bleeding history  Psychiatric/Behavioral: Denies suicidal ideation, mood changes, confusion, nervousness, sleep disturbance and agitation    Physical Exam: Vitals:   10/04/20 0728  BP: 126/80  Pulse: 83  Temp: 97.9 F (36.6 C)  TempSrc:  Oral  SpO2: 98%  Weight: 203 lb 7 oz (92.3 kg)  Height: 5\' 2"  (1.575 m)    Body mass index is 37.21 kg/m.   Constitutional: NAD, calm, comfortable Eyes: PERRL, lids and conjunctivae normal ENMT: Mucous membranes are moist.  Respiratory: clear to auscultation bilaterally, no wheezing, no crackles. Normal respiratory effort. No accessory muscle use.  Cardiovascular: Regular rate and rhythm, no murmurs / rubs / gallops. No extremity edema.  Neurologic: Grossly intact and nonfocal Psychiatric: Normal judgment and insight. Alert and oriented x 3. Normal mood.    Impression and Plan:  Diabetes mellitus without complication ([place]) -Followed by endocrinology with a recent A1c of 6.5, she is not currently on medication.  Vitamin D deficiency -Recent vitamin D level was normal at 5.3.  Hyperlipidemia associated with type 2 diabetes mellitus ([place]) -She is not on statin therapy due to history of transaminitis, recent LDL cholesterol was 75.  Attention deficit disorder, unspecified hyperactivity presence  -PDMP reviewed, no red flags,  overdose risk score is 110. -I have agreed to have requested Adderall change from 20+10 down to 10 mg twice daily for total of 60 tablets a month x3 months.  Time spent: 32 minutes reviewing chart, interviewing and examining patient and formulating plan of care.     Lelon Frohlich, MD [facility] at [facility]

## 2020-10-08 ENCOUNTER — Encounter: Payer: Self-pay | Admitting: Internal Medicine

## 2020-10-13 MED ORDER — SEMAGLUTIDE-WEIGHT MANAGEMENT 0.25 MG/0.5ML ~~LOC~~ SOAJ: 0.2500 mg | SUBCUTANEOUS | 0 refills | Status: DC

## 2020-10-13 MED ORDER — SEMAGLUTIDE(0.25 OR 0.5MG/DOS) 2 MG/1.5ML ~~LOC~~ SOPN: 0.5000 mg | PEN_INJECTOR | SUBCUTANEOUS | 0 refills | Status: DC

## 2020-10-17 NOTE — Telephone Encounter (Signed)
Key: [id number] Prior Auth started with Dx DM

## 2020-10-20 ENCOUNTER — Other Ambulatory Visit: Payer: Self-pay | Admitting: *Deleted

## 2020-10-20 MED ORDER — SEMAGLUTIDE(0.25 OR 0.5MG/DOS) 2 MG/1.5ML ~~LOC~~ SOPN: 0.5000 mg | PEN_INJECTOR | SUBCUTANEOUS | 2 refills | Status: DC

## 2020-10-20 NOTE — Telephone Encounter (Signed)
Patient's insurance requests 90 days

## 2020-10-23 ENCOUNTER — Other Ambulatory Visit: Payer: Self-pay | Admitting: Internal Medicine

## 2020-10-23 DIAGNOSIS — E1169 Type 2 diabetes mellitus with other specified complication: Secondary | ICD-10-CM

## 2020-11-23 ENCOUNTER — Other Ambulatory Visit: Payer: Self-pay | Admitting: Internal Medicine

## 2020-11-29 ENCOUNTER — Telehealth: Payer: Self-pay | Admitting: *Deleted

## 2020-11-29 NOTE — Telephone Encounter (Signed)
Prior Auth [employer]  Key: [id number]

## 2020-12-01 NOTE — Telephone Encounter (Signed)
An active PA is already on file with expiration date of 10/17/2021.

## 2020-12-13 ENCOUNTER — Telehealth: Payer: Self-pay | Admitting: Internal Medicine

## 2020-12-13 ENCOUNTER — Encounter: Payer: Self-pay | Admitting: Internal Medicine

## 2020-12-13 DIAGNOSIS — F988 Other specified behavioral and emotional disorders with onset usually occurring in childhood and adolescence: Secondary | ICD-10-CM

## 2020-12-13 NOTE — Telephone Encounter (Signed)
Patient should have 1 refill.  Okay to send to [facility]?

## 2020-12-13 NOTE — Telephone Encounter (Signed)
PT called to request a refill of their amphetamine-dextroamphetamine (ADDERALL) 10 MG tablet the rapid release one to be called into the [facility] on [address] in [place]. They also state for future refills for this medication they would like it sent here. Please advise and thanks. PT is out of their meds.

## 2020-12-14 MED ORDER — AMPHETAMINE-DEXTROAMPHETAMINE 10 MG PO TABS: 10.0000 mg | ORAL_TABLET | Freq: Two times a day (BID) | ORAL | 0 refills | Status: DC

## 2020-12-14 NOTE — Addendum Note (Signed)
Addended by: Westley Hummer B on: 12/14/2020 01:16 PM   Modules accepted: Orders

## 2020-12-17 ENCOUNTER — Other Ambulatory Visit: Payer: Self-pay | Admitting: Internal Medicine

## 2020-12-22 ENCOUNTER — Other Ambulatory Visit: Payer: Self-pay | Admitting: Internal Medicine

## 2021-01-20 ENCOUNTER — Other Ambulatory Visit: Payer: Self-pay | Admitting: Internal Medicine

## 2021-01-20 DIAGNOSIS — E1169 Type 2 diabetes mellitus with other specified complication: Secondary | ICD-10-CM

## 2021-02-19 ENCOUNTER — Encounter: Payer: Self-pay | Admitting: Internal Medicine

## 2021-02-20 ENCOUNTER — Other Ambulatory Visit: Payer: Self-pay | Admitting: Internal Medicine

## 2021-02-20 DIAGNOSIS — E119 Type 2 diabetes mellitus without complications: Secondary | ICD-10-CM

## 2021-02-20 MED ORDER — TIRZEPATIDE 2.5 MG/0.5ML ~~LOC~~ SOAJ: 2.5000 mg | SUBCUTANEOUS | 2 refills | Status: DC

## 2021-02-22 ENCOUNTER — Telehealth: Payer: Self-pay | Admitting: *Deleted

## 2021-02-22 NOTE — Telephone Encounter (Signed)
PA approved Key [id number] Prior Auth;Coverage Start Date:01/23/2021;Coverage End Date:02/22/2022;

## 2021-02-23 ENCOUNTER — Encounter: Payer: Self-pay | Admitting: Internal Medicine

## 2021-02-28 ENCOUNTER — Encounter: Payer: Self-pay | Admitting: Internal Medicine

## 2021-02-28 ENCOUNTER — Telehealth (INDEPENDENT_AMBULATORY_CARE_PROVIDER_SITE_OTHER): Payer: BC Managed Care – PPO | Admitting: Internal Medicine

## 2021-02-28 VITALS — Wt 202.0 lb

## 2021-02-28 DIAGNOSIS — E1169 Type 2 diabetes mellitus with other specified complication: Secondary | ICD-10-CM | POA: Diagnosis not present

## 2021-02-28 DIAGNOSIS — F988 Other specified behavioral and emotional disorders with onset usually occurring in childhood and adolescence: Secondary | ICD-10-CM | POA: Diagnosis not present

## 2021-02-28 MED ORDER — AMPHETAMINE-DEXTROAMPHETAMINE 10 MG PO TABS
10.0000 mg | ORAL_TABLET | Freq: Two times a day (BID) | ORAL | 0 refills | Status: DC
Start: 2021-02-28 — End: 2021-05-18

## 2021-02-28 MED ORDER — AMPHETAMINE-DEXTROAMPHETAMINE 10 MG PO TABS
10.0000 mg | ORAL_TABLET | Freq: Two times a day (BID) | ORAL | 0 refills | Status: DC
Start: 2021-02-28 — End: 2021-08-08

## 2021-02-28 MED ORDER — AMPHETAMINE-DEXTROAMPHETAMINE 10 MG PO TABS: 10.0000 mg | ORAL_TABLET | Freq: Two times a day (BID) | ORAL | 0 refills | Status: DC

## 2021-02-28 NOTE — Progress Notes (Signed)
Virtual Visit via Telephone Note  I connected with Carrie [name] on 02/28/21 at 10:00 AM EST by telephone and verified that I am speaking with the correct person using two identifiers.   I discussed the limitations, risks, security and privacy concerns of performing an evaluation and management service by telephone and the availability of in person appointments. I also discussed with the patient that there may be a patient responsible charge related to this service. The patient expressed understanding and agreed to proceed.  Location patient: home Location provider: work office Participants present for the call: patient, provider Patient did not have a visit in the prior 7 days to address this/these issue(s).   History of Present Illness:  She has scheduled this visit to discuss her ADHD and for refills per protocol.  She has been on Adderall 10 mg twice daily.  Feels like dose works well for her.  She also did her first injection of Mounjaro over the weekend and has been tolerating it well.   Observations/Objective: Patient sounds cheerful and well on the phone. I do not appreciate any increased work of breathing. Speech and thought processing are grossly intact. Patient reported vitals: None reported   Current Outpatient Medications:    albuterol (PROVENTIL) (2.5 MG/3ML) 0.083% nebulizer solution, USE 1 VIAL VIA NEBULIZER EVERY 4 HOURS AS NEEDED FOR WHEEZE/SHORTNESS OF BREATH (Patient taking differently: Take 2.5 mg by nebulization every 4 (four) hours as needed for wheezing or shortness of breath.), Disp: 75 mL, Rfl: 6   albuterol (VENTOLIN HFA) 108 (90 Base) MCG/ACT inhaler, INHALE 2 INHALATIONS BY MOUTH EVERY 6 HOURS AS NEEDED, Disp: 8.5 g, Rfl: 2   atorvastatin (LIPITOR) 20 MG tablet, TAKE 1 TABLET(20 MG) BY MOUTH DAILY, Disp: 90 tablet, Rfl: 0   levothyroxine (SYNTHROID) 75 MCG tablet, Take 75 mcg by mouth daily before breakfast., Disp: , Rfl:    montelukast (SINGULAIR) 10  MG tablet, Take 1 tablet (10 mg total) by mouth at bedtime., Disp: 30 tablet, Rfl: 11   pantoprazole (PROTONIX) 40 MG tablet, Take 30-60 min before first meal of the day, Disp: 90 tablet, Rfl: 11   SYMBICORT 160-4.5 MCG/ACT inhaler, INHALE 2 PUFFS INTO THE LUNGS TWICE DAILY, Disp: 10.2 g, Rfl: 5   tirzepatide (MOUNJARO) 2.5 MG/0.5ML Pen, Inject 2.5 mg into the skin once a week., Disp: 2 mL, Rfl: 2   amphetamine-dextroamphetamine (ADDERALL) 10 MG tablet, Take 1 tablet (10 mg total) by mouth 2 (two) times daily., Disp: 60 tablet, Rfl: 0   amphetamine-dextroamphetamine (ADDERALL) 10 MG tablet, Take 1 tablet (10 mg total) by mouth 2 (two) times daily., Disp: 60 tablet, Rfl: 0   amphetamine-dextroamphetamine (ADDERALL) 10 MG tablet, Take 1 tablet (10 mg total) by mouth 2 (two) times daily with a meal., Disp: 60 tablet, Rfl: 0  Review of Systems:  Constitutional: Denies fever, chills, diaphoresis, appetite change and fatigue.  HEENT: Denies photophobia, eye pain, redness, hearing loss, ear pain, congestion, sore throat, rhinorrhea, sneezing, mouth sores, trouble swallowing, neck pain, neck stiffness and tinnitus.   Respiratory: Denies SOB, DOE, cough, chest tightness,  and wheezing.   Cardiovascular: Denies chest pain, palpitations and leg swelling.  Gastrointestinal: Denies nausea, vomiting, abdominal pain, diarrhea, constipation, blood in stool and abdominal distention.  Genitourinary: Denies dysuria, urgency, frequency, hematuria, flank pain and difficulty urinating.  Endocrine: Denies: hot or cold intolerance, sweats, changes in hair or nails, polyuria, polydipsia. Musculoskeletal: Denies myalgias, back pain, joint swelling, arthralgias and gait problem.  Skin:  Denies pallor, rash and wound.  Neurological: Denies dizziness, seizures, syncope, weakness, light-headedness, numbness and headaches.  Hematological: Denies adenopathy. Easy bruising, personal or family bleeding history   Psychiatric/Behavioral: Denies suicidal ideation, mood changes, confusion, nervousness, sleep disturbance and agitation   Assessment and Plan:  Type 2 diabetes mellitus with other specified complication, without long-term current use of insulin ([place]) -Just started Mounjaro this week and tolerating well. -Check A1c when she returns.  Attention deficit disorder, unspecified hyperactivity presence  -PDMP reviewed, no red flags, overdose risk score is 0. -Refill Adderall 10 mg twice daily for total of 60 tablets a month x3 months.    I discussed the assessment and treatment plan with the patient. The patient was provided an opportunity to ask questions and all were answered. The patient agreed with the plan and demonstrated an understanding of the instructions.   The patient was advised to call back or seek an in-person evaluation if the symptoms worsen or if the condition fails to improve as anticipated.  I provided 13 minutes of non-face-to-face time during this encounter.   Lelon Frohlich, MD [facility] at [facility]

## 2021-03-02 ENCOUNTER — Ambulatory Visit (INDEPENDENT_AMBULATORY_CARE_PROVIDER_SITE_OTHER): Payer: Self-pay | Admitting: Plastic Surgery

## 2021-03-02 ENCOUNTER — Encounter: Payer: Self-pay | Admitting: Plastic Surgery

## 2021-03-02 ENCOUNTER — Other Ambulatory Visit: Payer: Self-pay

## 2021-03-02 DIAGNOSIS — Z719 Counseling, unspecified: Secondary | ICD-10-CM

## 2021-03-02 NOTE — Progress Notes (Signed)

## 2021-03-07 ENCOUNTER — Encounter: Payer: Self-pay | Admitting: Internal Medicine

## 2021-03-08 MED ORDER — TIRZEPATIDE 5 MG/0.5ML ~~LOC~~ SOAJ: 5.0000 mg | SUBCUTANEOUS | 3 refills | Status: DC

## 2021-03-08 NOTE — Addendum Note (Signed)
Addended by: Westley Hummer B on: 03/08/2021 11:57 AM   Modules accepted: Orders

## 2021-03-21 ENCOUNTER — Ambulatory Visit: Payer: BC Managed Care – PPO | Admitting: Internal Medicine

## 2021-03-30 ENCOUNTER — Other Ambulatory Visit: Payer: Self-pay

## 2021-03-30 ENCOUNTER — Ambulatory Visit (INDEPENDENT_AMBULATORY_CARE_PROVIDER_SITE_OTHER): Payer: BC Managed Care – PPO | Admitting: Obstetrics & Gynecology

## 2021-03-30 ENCOUNTER — Encounter: Payer: Self-pay | Admitting: Obstetrics & Gynecology

## 2021-03-30 VITALS — BP 134/84 | Ht 62.5 in | Wt 194.0 lb

## 2021-03-30 DIAGNOSIS — Z90711 Acquired absence of uterus with remaining cervical stump: Secondary | ICD-10-CM

## 2021-03-30 DIAGNOSIS — E6609 Other obesity due to excess calories: Secondary | ICD-10-CM | POA: Diagnosis not present

## 2021-03-30 DIAGNOSIS — Z780 Asymptomatic menopausal state: Secondary | ICD-10-CM | POA: Diagnosis not present

## 2021-03-30 DIAGNOSIS — Z6834 Body mass index (BMI) 34.0-34.9, adult: Secondary | ICD-10-CM

## 2021-03-30 DIAGNOSIS — Z01419 Encounter for gynecological examination (general) (routine) without abnormal findings: Secondary | ICD-10-CM | POA: Diagnosis not present

## 2021-03-30 DIAGNOSIS — Z23 Encounter for immunization: Secondary | ICD-10-CM

## 2021-03-30 NOTE — Progress Notes (Signed)
Carrie [name] 10/05/1960 [id number]   History:    61 y.o. G2P2L2  RP:  Established patient presenting for annual gyn exam   HPI: Postmenopausal. Prior Robotic supracervical hysterectomy in 2007 for uterine fibroids (I assisted Dr Maryelizabeth Rowan).  No significant hot flashes or night sweats.  No vaginal bleeding.  No pelvic pain.  Pap smear Neg/HPV HR Neg in 03/2020.  Breasts normal.  Mammo Neg 08/2019, will schedule now.  Colono 05/2020.  BD Normal 08/2020.  BMI 34.92, lost weight x last year.  Had knee replacement in 2021.  Health labs with Fam MD.  Past medical history,surgical history, family history and social history were all reviewed and documented in the EPIC chart.  Gynecologic History No LMP recorded. Patient has had a hysterectomy.  Obstetric History OB History  Gravida Para Term Preterm AB Living  2       0 2  SAB IAB Ectopic Multiple Live Births  0   0 0      # Outcome Date GA Lbr Len/2nd Weight Sex Delivery Anes PTL Lv  2 Gravida           1 Gravida              ROS: A ROS was performed and pertinent positives and negatives are included in the history.  GENERAL: No fevers or chills. HEENT: No change in vision, no earache, sore throat or sinus congestion. NECK: No pain or stiffness. CARDIOVASCULAR: No chest pain or pressure. No palpitations. PULMONARY: No shortness of breath, cough or wheeze. GASTROINTESTINAL: No abdominal pain, nausea, vomiting or diarrhea, melena or bright red blood per rectum. GENITOURINARY: No urinary frequency, urgency, hesitancy or dysuria. MUSCULOSKELETAL: No joint or muscle pain, no back pain, no recent trauma. DERMATOLOGIC: No rash, no itching, no lesions. ENDOCRINE: No polyuria, polydipsia, no heat or cold intolerance. No recent change in weight. HEMATOLOGICAL: No anemia or easy bruising or bleeding. NEUROLOGIC: No headache, seizures, numbness, tingling or weakness. PSYCHIATRIC: No depression, no loss of interest in normal activity or change in sleep  pattern.     Exam:   BP 134/84    Ht 5' 2.5" (1.588 m)    Wt 194 lb (88 kg)    BMI 34.92 kg/m   Body mass index is 34.92 kg/m.  General appearance : Well developed well nourished female. No acute distress HEENT: Eyes: no retinal hemorrhage or exudates,  Neck supple, trachea midline, no carotid bruits, no thyroidmegaly Lungs: Clear to auscultation, no rhonchi or wheezes, or rib retractions  Heart: Regular rate and rhythm, no murmurs or gallops Breast:Examined in sitting and supine position were symmetrical in appearance, no palpable masses or tenderness,  no skin retraction, no nipple inversion, no nipple discharge, no skin discoloration, no axillary or supraclavicular lymphadenopathy Abdomen: no palpable masses or tenderness, no rebound or guarding Extremities: no edema or skin discoloration or tenderness  Pelvic: Vulva: Normal             Vagina: No gross lesions or discharge  Cervix: No gross lesions or discharge  Uterus Absent  Adnexa  Without masses or tenderness  Anus: Normal   Assessment/Plan:  61 y.o. female for annual exam   1. Well female exam with routine gynecological exam Postmenopausal. Prior Robotic supracervical hysterectomy in 2007 for uterine fibroids (I assisted Dr Maryelizabeth Rowan).  No significant hot flashes or night sweats.  No vaginal bleeding.  No pelvic pain.  Pap smear Neg/HPV HR Neg in 03/2020.  Breasts  normal.  Mammo Neg 08/2019, will schedule now.  Colono 05/2020.  BD Normal 08/2020.  BMI 34.92, lost weight x last year.  Had knee replacement in 2021.  Health labs with Fam MD.  2. History of hysterectomy, supracervical  3. Postmenopause Postmenopausal. Prior Robotic supracervical hysterectomy in 2007 for uterine fibroids (I assisted Dr Maryelizabeth Rowan).  No significant hot flashes or night sweats.  No vaginal bleeding.  No pelvic pain.    4. Class 1 obesity due to excess calories with serious comorbidity and body mass index (BMI) of 34.0 to 34.9 in adult  Weight loss  x last year.  Continue with low calorie/carb diet and fitness activities.  Princess Bruins MD, 8:29 AM 03/30/2021

## 2021-04-16 ENCOUNTER — Encounter: Payer: Self-pay | Admitting: Internal Medicine

## 2021-04-16 ENCOUNTER — Other Ambulatory Visit: Payer: Self-pay | Admitting: Internal Medicine

## 2021-04-16 DIAGNOSIS — E119 Type 2 diabetes mellitus without complications: Secondary | ICD-10-CM

## 2021-04-16 MED ORDER — TIRZEPATIDE 7.5 MG/0.5ML ~~LOC~~ SOAJ: 7.5000 mg | SUBCUTANEOUS | 0 refills | Status: DC

## 2021-04-16 NOTE — Telephone Encounter (Signed)
Noted  

## 2021-04-27 ENCOUNTER — Ambulatory Visit: Payer: BC Managed Care – PPO | Admitting: Internal Medicine

## 2021-05-18 ENCOUNTER — Other Ambulatory Visit: Payer: Self-pay | Admitting: Internal Medicine

## 2021-05-18 DIAGNOSIS — E119 Type 2 diabetes mellitus without complications: Secondary | ICD-10-CM

## 2021-05-18 DIAGNOSIS — F988 Other specified behavioral and emotional disorders with onset usually occurring in childhood and adolescence: Secondary | ICD-10-CM

## 2021-05-21 ENCOUNTER — Ambulatory Visit (INDEPENDENT_AMBULATORY_CARE_PROVIDER_SITE_OTHER): Payer: BC Managed Care – PPO | Admitting: Internal Medicine

## 2021-05-21 ENCOUNTER — Other Ambulatory Visit: Payer: Self-pay

## 2021-05-21 ENCOUNTER — Encounter: Payer: Self-pay | Admitting: Internal Medicine

## 2021-05-21 DIAGNOSIS — J4530 Mild persistent asthma, uncomplicated: Secondary | ICD-10-CM

## 2021-05-21 MED ORDER — AMPHETAMINE-DEXTROAMPHETAMINE 10 MG PO TABS: 10.0000 mg | ORAL_TABLET | Freq: Two times a day (BID) | ORAL | 0 refills | Status: DC

## 2021-05-21 MED ORDER — BUDESONIDE-FORMOTEROL FUMARATE 160-4.5 MCG/ACT IN AERO: 2.0000 | INHALATION_SPRAY | Freq: Two times a day (BID) | RESPIRATORY_TRACT | 3 refills | Status: DC

## 2021-05-21 MED ORDER — MONTELUKAST SODIUM 10 MG PO TABS: 10.0000 mg | ORAL_TABLET | Freq: Every day | ORAL | 3 refills | Status: DC

## 2021-05-21 MED ORDER — TIRZEPATIDE 7.5 MG/0.5ML ~~LOC~~ SOAJ: 7.5000 mg | SUBCUTANEOUS | 0 refills | Status: DC

## 2021-05-21 NOTE — Progress Notes (Signed)
Subjective:    Patient ID: Carrie [name], female   DOB: 1961/01/04    MRN: [id number]     Brief patient profile:  61 yobf never smoker onset asthma age 61 and placed on shots for "everything that grows" needed to stay on inhalers / complicated by 3 admits requiring ventilator last time around age 61 and overall much better since then but intermittently on prednisone and as of 2011 required daily prednisone so referred to pulmonary clinic 02/13/2016 by Dr   Julianne Rice allergy shots but off  since around 2000    History of Present Illness  02/13/2016 1st [facility] office visit/ Carrie [name]  maint rx qvar 80 2bid/ singiulair/ pred 10  Chief Complaint  Patient presents with   Pulmonary Consult    Referred by Dr. Sherren Mocha for eval of Asthma. Pt states that she has been on prednisone for years for her asthma. She is currently not having any symptoms.   for the last six years only comes off prednisone for a few days then back on it due to wheeze/ sob s much cough and presently on 10 mg daily and qvar 80 2 every 12 hours and no saba x sev weeks   Apparently doesn't taper below 10 in any increment Has intermittent hb on prn ppi  rec Pantoprazole (protonix) 40 mg   Take  30-60 min before first meal of the day and Pepcid (famotidine)  20 mg one @  bedtime until return to office    Qvar 80 Take 2 puffs first thing in am and then another 2 puffs about 12 hours later.  Work on inhaler technique:     Only use your albuterol as a rescue medication  Try Prednisone 5 mg daily x 2 weeks then  5 alternating with 2.5 mg (one-half pill) - if worse resume previous dose     04/16/2016  f/u ov/Carrie [name] re: steroid dep asthma with pred down to 5 mg daily / qvar 80 2bid / singulair /gerd rx  Chief Complaint  Patient presents with   Follow-up    Breathing is overall doing well. She c/o minimal congestion "behind my nose"- just started today. She is on pred 5 mg daily and has not went any lower than that. She  has not had to use her proair.   Not limited by breathing from desired activities  / no saba use at all  Some nausea at the lower doses of pred but very happy with wt loss on the lower doses rec Take 5 mg one half daily x 2 weeks then one half even days x 2 weeks then one half every 3 days x 2 weeks and stop. If get worse nausea/ loss of appetite at any point go back up to higher dose  and alternate doses even/odd days   03/19/2018  f/u ov/Carrie [name] re: chronic asthma / off pred mid Dec 2019 / maint symb 160 2bid and no rescue on hand  Chief Complaint  Patient presents with   Follow-up    Breathing is doing well and she has not used her albuterol inhaler recently. No new co's.   Dyspnea:  Not limited by breathing from desired activities / but by L knee Cough: no issue  Sleeping: ok now flat/ one pillow  SABA use: no rescue  rec Plan A = Automatic = symbicort 160 Take 2 puffs first thing in am and then another 2 puffs about 12 hours later.  Plan B = Backup  Only use your albuterol inhaler as a rescue medication Plan C = Crisis - only use your albuterol nebulizer if you first try Plan B and it fails to help > ok to use the nebulizer up to every 4 hours but if start needing it regularly call for immediate appointment  03/22/2019  f/u ov/Carrie [name] re: asthma s/p covid infection outpt rx  Chief Complaint  Patient presents with   Follow-up    Asthma, no symptoms  Dyspnea:  Not limited by breathing from desired activities   Cough: none  Sleeping: poorly due to insomnia since covid  but no resp c/o's on one pillow SABA use: hfa rarely 02: none  rec Refer to allergy >never went  No change in medications Weight control is a matter of calorie balance  After spring season, if you are doing fine on the 160 you can try the 80       05/21/2021  f/u ov/Carrie [name] re: annual eval of previously poorly controlled  asthma no longer pred dep/ symbicort 160 1 bid and singulair with no flares over last year  Chief  Complaint  Patient presents with   Follow-up    No concerns at this time  Dyspnea:  walking on job, steps are fine  Cough: tolerable  Sleeping: no resp cc  < 30 cc electric bed  SABA use: none  02: none  Covid status:   twice infected/ 3 x infected    No obvious day to day or daytime variability or assoc excess/ purulent sputum or mucus plugs or hemoptysis or cp or chest tightness, subjective wheeze or overt sinus or hb symptoms.   Sleeping  without nocturnal  or early am exacerbation  of respiratory  c/o's or need for noct saba. Also denies any obvious fluctuation of symptoms with weather or environmental changes or other aggravating or alleviating factors except as outlined above   No unusual exposure hx or h/o childhood pna/ asthma or knowledge of premature birth.  Current Allergies, Complete Past Medical History, Past Surgical History, Family History, and Social History were reviewed in [employer] record.  ROS  The following are not active complaints unless bolded Hoarseness, sore throat, dysphagia, dental problems, itching, sneezing,  nasal congestion or discharge of excess mucus or purulent secretions, ear ache,   fever, chills, sweats, unintended wt loss or wt gain, classically pleuritic or exertional cp,  orthopnea pnd or arm/hand swelling  or leg swelling, presyncope, palpitations, abdominal pain, anorexia, nausea, vomiting, diarrhea  or change in bowel habits or change in bladder habits, change in stools or change in urine, dysuria, hematuria,  rash, arthralgias, visual complaints, headache, numbness, weakness or ataxia or problems with walking or coordination,  change in mood or  memory.        Current Meds  Medication Sig   albuterol (PROVENTIL) (2.5 MG/3ML) 0.083% nebulizer solution USE 1 VIAL VIA NEBULIZER EVERY 4 HOURS AS NEEDED FOR WHEEZE/SHORTNESS OF BREATH (Patient taking differently: Take 2.5 mg by nebulization every 4 (four) hours as needed for  wheezing or shortness of breath.)   albuterol (VENTOLIN HFA) 108 (90 Base) MCG/ACT inhaler INHALE 2 INHALATIONS BY MOUTH EVERY 6 HOURS AS NEEDED   amphetamine-dextroamphetamine (ADDERALL) 10 MG tablet Take 1 tablet (10 mg total) by mouth 2 (two) times daily.   amphetamine-dextroamphetamine (ADDERALL) 10 MG tablet Take 1 tablet (10 mg total) by mouth 2 (two) times daily.   amphetamine-dextroamphetamine (ADDERALL) 10 MG tablet Take 1 tablet (10 mg total) by mouth  2 (two) times daily with a meal.   atorvastatin (LIPITOR) 20 MG tablet TAKE 1 TABLET(20 MG) BY MOUTH DAILY   levothyroxine (SYNTHROID) 75 MCG tablet Take 75 mcg by mouth daily before breakfast.   montelukast (SINGULAIR) 10 MG tablet Take 1 tablet (10 mg total) by mouth at bedtime.   pantoprazole (PROTONIX) 40 MG tablet Take 30-60 min before first meal of the day   SYMBICORT 160-4.5 MCG/ACT inhaler INHALE 2 PUFFS INTO THE LUNGS TWICE DAILY   tirzepatide (MOUNJARO) 7.5 MG/0.5ML Pen Inject 7.5 mg into the skin once a week.                   Objective:   Physical Exam   Wt 05/21/2021         184  03/21/2020         201 03/22/2019         197   09/17/2018        203  03/19/2018         206  02/20/2018       203  12/27/2016     173  06/26/2016        188  04/16/2016       191   02/13/16 203 lb 3.2 oz (92.2 kg)  02/05/16 200 lb (90.7 kg)  01/24/16 200 lb (90.7 kg)      Vital signs reviewed  05/21/2021  - Note at rest 02 sats  98% on RA   General appearance:    amb bf all smiles   HEENT : pt wearing mask not removed for exam due to covid -19 concerns.    NECK :  without JVD/Nodes/TM/ nl carotid upstrokes bilaterally   LUNGS: no acc muscle use,  Nl contour chest which is clear to A and P bilaterally without cough on insp or exp maneuvers   CV:  RRR  no s3 or murmur or increase in P2, and no edema   ABD:  soft and nontender with nl inspiratory excursion in the supine position. No bruits or organomegaly appreciated, bowel sounds  nl  MS:  Nl gait/ ext warm without deformities, calf tenderness, cyanosis or clubbing No obvious joint restrictions   SKIN: warm and dry without lesions    NEURO:  alert, approp, nl sensorium with  no motor or cerebellar deficits apparent.          Assessment:

## 2021-05-21 NOTE — Assessment & Plan Note (Signed)
Onset age 61 ?Spirometry 02/13/2016  Nl x mid flows reduced to 56% and min curvature  pre - saba on qvar 80 2bid and pred 10 mg daily > tapered off as of 06/26/2016 only on physiologic steroids per dr Christeen Douglas guidance  ?- 12/27/2016     continue qvar 80 2bid ?- Spirometry 02/20/2018  FEV1 1.6 (78%)  Ratio 68 with mild curvature > 6 h since last saba  ?- FENO 02/20/2018  =   147 on qvar 80 2bid  ?- 02/20/2018    try step up to symb 160 2bid from qvar and off chronic pred mid dec 2019 ?- 09/17/2018  After extensive coaching inhaler device,  effectiveness =    90% and continue singulair ? ?All goals of chronic asthma control met including optimal function and elimination of symptoms with minimal need for rescue therapy on symbicort 160 one bid and singulair daily  ? ?Based on two studies from NEJM  378; 20 p 1865 (2018) and 380 : p2020-30 (2019) in pts with mild asthma it is reasonable to use low dose symbicort eg 160 one bid with the extra doses prn x up to a week for flare - I  emphasized this was only shown with symbicort and takes advantage of the rapid onset of action but is not the same as "rescue therapy" but can be tapered once all symptoms have resolved for a week. ? ?Contingencies discussed in full including contacting this office immediately if not controlling the symptoms using the rule of two's.    ? ?    ?  ? ?Each maintenance medication was reviewed in detail including emphasizing most importantly the difference between maintenance and prns and under what circumstances the prns are to be triggered using an action plan format where appropriate. ? ?Total time for H and P, chart review, counseling, reviewing hfa device(s) and generating customized AVS unique to this annual  office visit / same day charting = 83mn  ?     ?

## 2021-05-21 NOTE — Patient Instructions (Signed)
Plan A = Automatic = Always=    symbicort 1-2 every 12 hours and singulair each pm  ? ?Plan B = Backup (to supplement plan A, not to replace it) ?Only use your albuterol inhaler as a rescue medication to be used if you can't catch your breath by resting or doing a relaxed purse lip breathing pattern.  ?- The less you use it, the better it will work when you need it. ?- Ok to use the inhaler up to 2 puffs  every 4 hours if you must but call for appointment if use goes up over your usual need ?- Don't leave home without it !!  (think of it like the spare tire for your car)  ? ?In the event of a flare of your symptoms > symbicort 160 is 2 every 12 hours for at least a week the taper ? ? ?Please schedule a follow up visit in 12  months but call sooner if needed  ? ?  ?  ?

## 2021-07-11 ENCOUNTER — Other Ambulatory Visit: Payer: Self-pay | Admitting: Internal Medicine

## 2021-07-11 DIAGNOSIS — E1169 Type 2 diabetes mellitus with other specified complication: Secondary | ICD-10-CM

## 2021-07-11 DIAGNOSIS — J4530 Mild persistent asthma, uncomplicated: Secondary | ICD-10-CM

## 2021-08-07 ENCOUNTER — Ambulatory Visit (INDEPENDENT_AMBULATORY_CARE_PROVIDER_SITE_OTHER): Payer: Self-pay | Admitting: Plastic Surgery

## 2021-08-07 ENCOUNTER — Encounter: Payer: Self-pay | Admitting: Plastic Surgery

## 2021-08-07 DIAGNOSIS — Z719 Counseling, unspecified: Secondary | ICD-10-CM

## 2021-08-07 NOTE — Progress Notes (Signed)
Botulinum Toxin and Filler Injection Procedure Note  Procedure: Cosmetic botulinum toxin   Pre-operative Diagnosis: Dynamic rhytides   Post-operative Diagnosis: Same  Complications:  None  Brief history: The patient desires botulinum toxin injection of her forehead. I discussed with the patient this proposed procedure of botulinum toxin injections, which is customized depending on the particular needs of the patient. It is performed on facial rhytids as a temporary correction. The alternatives were discussed with the patient. The risks were addressed including bleeding, scarring, infection, damage to deeper structures, asymmetry, and chronic pain, which may occur infrequently after a procedure. The individual's choice to undergo a surgical procedure is based on the comparison of risks to potential benefits. Other risks include unsatisfactory results, brow ptosis, eyelid ptosis, allergic reaction, temporary paralysis, which should go away with time, bruising, blurring disturbances and delayed healing. Botulinum toxin injections do not arrest the aging process or produce permanent tightening of the eyelid.  Operative intervention maybe necessary to maintain the results of a blepharoplasty or botulinum toxin. The patient understands and wishes to proceed.  Procedure: The area was prepped with alcohol and dried with a clean gauze. Using a clean technique, the botulinum toxin was diluted with 1.25 cc of preservative-free normal saline which was slowly injected with an 18 gauge needle in a tuberculin syringes.  A 32 gauge needles were then used to inject the botulinum toxin. This mixture allow for an aliquot of 4 units per 0.1 cc in each injection site.    Subsequently the mixture was injected in the glabellar and forehead area with preservation of the temporal branch to the lateral eyebrow as well as into each lateral canthal area beginning from the lateral orbital rim medial to the zygomaticus major in 3  separate areas. A total of 30 Units of botulinum toxin was used. The forehead and glabellar area was injected with care to inject intramuscular only while holding pressure on the supratrochlear vessels in each area during each injection on either side of the medial corrugators. The injection proceeded vertically superiorly to the medial 2/3 of the frontalis muscle and superior 2/3 of the lateral frontalis, again with preservation of the frontal branch.  No complications were noted. Light pressure was held for 5 minutes. She was instructed explicitly in post-operative care.  Botox LOT:  [id number] EXP:  2025/04

## 2021-08-08 ENCOUNTER — Telehealth (INDEPENDENT_AMBULATORY_CARE_PROVIDER_SITE_OTHER): Payer: BC Managed Care – PPO | Admitting: Internal Medicine

## 2021-08-08 ENCOUNTER — Encounter: Payer: Self-pay | Admitting: Internal Medicine

## 2021-08-08 DIAGNOSIS — F988 Other specified behavioral and emotional disorders with onset usually occurring in childhood and adolescence: Secondary | ICD-10-CM | POA: Diagnosis not present

## 2021-08-08 DIAGNOSIS — E119 Type 2 diabetes mellitus without complications: Secondary | ICD-10-CM

## 2021-08-08 DIAGNOSIS — E7800 Pure hypercholesterolemia, unspecified: Secondary | ICD-10-CM | POA: Diagnosis not present

## 2021-08-08 DIAGNOSIS — E2749 Other adrenocortical insufficiency: Secondary | ICD-10-CM | POA: Diagnosis not present

## 2021-08-08 DIAGNOSIS — E039 Hypothyroidism, unspecified: Secondary | ICD-10-CM | POA: Diagnosis not present

## 2021-08-08 DIAGNOSIS — E2740 Unspecified adrenocortical insufficiency: Secondary | ICD-10-CM | POA: Diagnosis not present

## 2021-08-08 MED ORDER — TIRZEPATIDE 7.5 MG/0.5ML ~~LOC~~ SOAJ: 7.5000 mg | SUBCUTANEOUS | 0 refills | Status: DC

## 2021-08-08 MED ORDER — AMPHETAMINE-DEXTROAMPHETAMINE 10 MG PO TABS: 10.0000 mg | ORAL_TABLET | Freq: Two times a day (BID) | ORAL | 0 refills | Status: DC

## 2021-08-08 NOTE — Telephone Encounter (Signed)
Appointment scheduled.

## 2021-08-08 NOTE — Progress Notes (Signed)
Virtual Visit via Video Note  I connected with Carrie [name] on 08/08/21 at  4:00 PM EDT by a video enabled telemedicine application and verified that I am speaking with the correct person using two identifiers.  Location patient: home Location provider: work office Persons participating in the virtual visit: patient, provider  I discussed the limitations of evaluation and management by telemedicine and the availability of in person appointments. The patient expressed understanding and agreed to proceed.   HPI: She has scheduled this visit for medication refills.  She has a history of ADHD and takes Adderall 10 mg twice daily.  She also has a history of type 2 diabetes and is on Mounjaro 7.5 mg weekly.  She tells me she has lost close to 30 pounds.  She has a visit with her endocrinologist today and her A1c is pending, however her previous A1c was well controlled at 6.1.  She feels well and has no acute concerns or complaints.   ROS: Constitutional: Denies fever, chills, diaphoresis, appetite change and fatigue.  HEENT: Denies photophobia, eye pain, redness, hearing loss, ear pain, congestion, sore throat, rhinorrhea, sneezing, mouth sores, trouble swallowing, neck pain, neck stiffness and tinnitus.   Respiratory: Denies SOB, DOE, cough, chest tightness,  and wheezing.   Cardiovascular: Denies chest pain, palpitations and leg swelling.  Gastrointestinal: Denies nausea, vomiting, abdominal pain, diarrhea, constipation, blood in stool and abdominal distention.  Genitourinary: Denies dysuria, urgency, frequency, hematuria, flank pain and difficulty urinating.  Endocrine: Denies: hot or cold intolerance, sweats, changes in hair or nails, polyuria, polydipsia. Musculoskeletal: Denies myalgias, back pain, joint swelling, arthralgias and gait problem.  Skin: Denies pallor, rash and wound.  Neurological: Denies dizziness, seizures, syncope, weakness, light-headedness, numbness and headaches.   Hematological: Denies adenopathy. Easy bruising, personal or family bleeding history  Psychiatric/Behavioral: Denies suicidal ideation, mood changes, confusion, nervousness, sleep disturbance and agitation   Past Medical History:  Diagnosis Date   A-fib ([place])    ADD (attention deficit disorder)    Adrenal abnormality ([facility])    Anemia, iron deficiency    Asthma    Fibromyalgia    GERD (gastroesophageal reflux disease)    Hiatal hernia    Hyperlipidemia    Numbness and tingling    Obese    Thyroid disease     Past Surgical History:  Procedure Laterality Date   ABDOMINAL HYSTERECTOMY     COLONOSCOPY     KNEE SURGERY     SHOULDER SURGERY     TONSILLECTOMY     TOTAL KNEE ARTHROPLASTY Left 01/15/2019   Procedure: TOTAL KNEE ARTHROPLASTY;  Surgeon: Sydnee Cabal, MD;  Location: [facility];  Service: Orthopedics;  Laterality: Left;    Family History  Problem Relation Age of Onset   Asthma Mother        seasonal   Colon cancer Mother    Lung cancer Maternal Uncle    Esophageal cancer Father        smoker, mets to lungs   Colon polyps Father    Diabetes Sister    Rectal cancer Neg Hx    Stomach cancer Neg Hx     SOCIAL HX:   reports that she has never smoked. She has never used smokeless tobacco. She reports current alcohol use. She reports that she does not use drugs.   Current Outpatient Medications:    albuterol (VENTOLIN HFA) 108 (90 Base) MCG/ACT inhaler, INHALE 2 INHALATIONS BY MOUTH EVERY 6 HOURS AS NEEDED, Disp: 8.5 g,  Rfl: 2   atorvastatin (LIPITOR) 20 MG tablet, TAKE 1 TABLET(20 MG) BY MOUTH DAILY, Disp: 90 tablet, Rfl: 0   budesonide-formoterol (SYMBICORT) 160-4.5 MCG/ACT inhaler, Inhale 2 puffs into the lungs 2 (two) times daily., Disp: 30.6 g, Rfl: 3   levothyroxine (SYNTHROID) 75 MCG tablet, Take 75 mcg by mouth daily before breakfast., Disp: , Rfl:    montelukast (SINGULAIR) 10 MG tablet, TAKE 1 TABLET(10 MG) BY MOUTH AT BEDTIME, Disp: 90 tablet, Rfl: 3    pantoprazole (PROTONIX) 40 MG tablet, TAKE 1 TABLET BY MOUTH 30 TO 60 MINUTES BEFORE FIRST MEAL OF THE DAY, Disp: 90 tablet, Rfl: 3   amphetamine-dextroamphetamine (ADDERALL) 10 MG tablet, Take 1 tablet (10 mg total) by mouth 2 (two) times daily., Disp: 60 tablet, Rfl: 0   amphetamine-dextroamphetamine (ADDERALL) 10 MG tablet, Take 1 tablet (10 mg total) by mouth 2 (two) times daily., Disp: 60 tablet, Rfl: 0   amphetamine-dextroamphetamine (ADDERALL) 10 MG tablet, Take 1 tablet (10 mg total) by mouth 2 (two) times daily with a meal., Disp: 60 tablet, Rfl: 0   tirzepatide (MOUNJARO) 7.5 MG/0.5ML Pen, Inject 7.5 mg into the skin once a week., Disp: 6 mL, Rfl: 0  EXAM:   VITALS per patient if applicable: None reported  GENERAL: alert, oriented, appears well and in no acute distress, wears corrective lenses  HEENT: atraumatic, conjunttiva clear, no obvious abnormalities on inspection of external nose and ears  NECK: normal movements of the head and neck  LUNGS: on inspection no signs of respiratory distress, breathing rate appears normal, no obvious gross increased work of breathing, gasping or wheezing  CV: no obvious cyanosis  MS: moves all visible extremities without noticeable abnormality  PSYCH/NEURO: pleasant and cooperative, no obvious depression or anxiety, speech and thought processing grossly intact  ASSESSMENT AND PLAN:   Attention deficit disorder, unspecified hyperactivity presence  -PDMP reviewed, no red flags, overdose risk score is 0. -Refill Adderall 10 mg to take 1 tablet twice daily for total of 60 tablets a month x3 months.  Diabetes mellitus without complication ([place]) -  Plan: tirzepatide (MOUNJARO) 7.5 MG/0.5ML Pen -Well-controlled with a recent A1c of 6.1, check microalbumin and perform foot exam at next in-person visit.     I discussed the assessment and treatment plan with the patient. The patient was provided an opportunity to ask questions and all were  answered. The patient agreed with the plan and demonstrated an understanding of the instructions.   The patient was advised to call back or seek an in-person evaluation if the symptoms worsen or if the condition fails to improve as anticipated.    Lelon Frohlich, MD  [facility]

## 2021-08-10 LAB — BASIC METABOLIC PANEL
BUN: 8 (ref 4–21)
CO2: 25 — AB (ref 13–22)
Chloride: 102 (ref 99–108)
Creatinine: 0.7 (ref 0.5–1.1)
Glucose: 95
Potassium: 4.1 mEq/L (ref 3.5–5.1)
Sodium: 140 (ref 137–147)

## 2021-08-10 LAB — LIPID PANEL
Cholesterol: 187 (ref 0–200)
HDL: 50 (ref 35–70)
LDL Cholesterol: 121
Triglycerides: 88 (ref 40–160)

## 2021-08-10 LAB — HEMOGLOBIN A1C: Hemoglobin A1C: 5.6

## 2021-08-10 LAB — HEPATIC FUNCTION PANEL
ALT: 15 U/L (ref 7–35)
AST: 17 (ref 13–35)
Alkaline Phosphatase: 107 (ref 25–125)
Bilirubin, Direct: 0.18 (ref 0.01–0.4)
Bilirubin, Total: 0.6

## 2021-08-10 LAB — COMPREHENSIVE METABOLIC PANEL
Calcium: 9.7 (ref 8.7–10.7)
eGFR: 98

## 2021-08-10 LAB — TSH: TSH: 2.61 (ref 0.41–5.90)

## 2021-08-23 DIAGNOSIS — E039 Hypothyroidism, unspecified: Secondary | ICD-10-CM | POA: Diagnosis not present

## 2021-08-23 DIAGNOSIS — E7800 Pure hypercholesterolemia, unspecified: Secondary | ICD-10-CM | POA: Diagnosis not present

## 2021-08-23 DIAGNOSIS — E2749 Other adrenocortical insufficiency: Secondary | ICD-10-CM | POA: Diagnosis not present

## 2021-08-23 DIAGNOSIS — E119 Type 2 diabetes mellitus without complications: Secondary | ICD-10-CM | POA: Diagnosis not present

## 2021-10-15 ENCOUNTER — Telehealth: Payer: Self-pay | Admitting: Internal Medicine

## 2021-10-15 DIAGNOSIS — E1169 Type 2 diabetes mellitus with other specified complication: Secondary | ICD-10-CM

## 2021-10-16 NOTE — Telephone Encounter (Signed)
OV on 10/04/20 notes: Hyperlipidemia associated with type 2 diabetes mellitus ([place]) -She is not on statin therapy due to history of transaminitis, recent LDL cholesterol was 75  LVM for clarification if patient is still taking.

## 2021-10-18 ENCOUNTER — Other Ambulatory Visit: Payer: Self-pay | Admitting: Internal Medicine

## 2021-10-18 DIAGNOSIS — F988 Other specified behavioral and emotional disorders with onset usually occurring in childhood and adolescence: Secondary | ICD-10-CM

## 2021-10-18 NOTE — Telephone Encounter (Signed)
Pt called back returning RN's call. RN was out of the office. Pt asked to speak to any one else that can answer some of her questions. CMA was unavailable at that moment and stated he would call her back after doing some research.  [phone]

## 2021-10-22 MED ORDER — AMPHETAMINE-DEXTROAMPHETAMINE 10 MG PO TABS: 10.0000 mg | ORAL_TABLET | Freq: Two times a day (BID) | ORAL | 0 refills | Status: DC

## 2021-10-22 NOTE — Telephone Encounter (Signed)
Patient has scheduled an appointment for 11/05/21

## 2021-10-22 NOTE — Telephone Encounter (Signed)
Spoke with patient and she is currently talking atorvastatin (LIPITOR) 20 MG tablet

## 2021-10-28 DIAGNOSIS — E119 Type 2 diabetes mellitus without complications: Secondary | ICD-10-CM | POA: Diagnosis not present

## 2021-10-28 LAB — HM DIABETES EYE EXAM

## 2021-11-05 ENCOUNTER — Encounter: Payer: Self-pay | Admitting: Internal Medicine

## 2021-11-05 ENCOUNTER — Ambulatory Visit (INDEPENDENT_AMBULATORY_CARE_PROVIDER_SITE_OTHER): Payer: BC Managed Care – PPO | Admitting: Internal Medicine

## 2021-11-05 VITALS — BP 124/88 | HR 82 | Temp 97.8°F | Wt 169.1 lb

## 2021-11-05 DIAGNOSIS — E1169 Type 2 diabetes mellitus with other specified complication: Secondary | ICD-10-CM | POA: Diagnosis not present

## 2021-11-05 DIAGNOSIS — E785 Hyperlipidemia, unspecified: Secondary | ICD-10-CM

## 2021-11-05 DIAGNOSIS — F988 Other specified behavioral and emotional disorders with onset usually occurring in childhood and adolescence: Secondary | ICD-10-CM

## 2021-11-05 LAB — POCT GLYCOSYLATED HEMOGLOBIN (HGB A1C): Hemoglobin A1C: 5.3 % (ref 4.0–5.6)

## 2021-11-05 LAB — MICROALBUMIN / CREATININE URINE RATIO
Creatinine,U: 74.2 mg/dL
Microalb Creat Ratio: 0.9 mg/g (ref 0.0–30.0)
Microalb, Ur: <0.7 mg/dL (ref 0.0–1.9)

## 2021-11-05 MED ORDER — TIRZEPATIDE 10 MG/0.5ML ~~LOC~~ SOAJ: 10.0000 mg | SUBCUTANEOUS | 0 refills | Status: DC

## 2021-11-05 NOTE — Progress Notes (Signed)
Established Patient Office Visit     CC/Reason for Visit: Follow-up chronic conditions  HPI: Carrie [name] is a 61 y.o. female who is coming in today for the above mentioned reasons. Past Medical History is significant for: Type 2 diabetes, hyperlipidemia, vitamin D deficiency, ADHD, obesity.  She has been doing well.  She has lost around 30 pounds.  She is requesting dose increase of Mounjaro today.  She states she recently had labs done at her endocrinologist office.  These are not available to me today.   Past Medical/Surgical History: Past Medical History:  Diagnosis Date   A-fib ([place])    ADD (attention deficit disorder)    Adrenal abnormality ([facility])    Anemia, iron deficiency    Asthma    Fibromyalgia    GERD (gastroesophageal reflux disease)    Hiatal hernia    Hyperlipidemia    Numbness and tingling    Obese    Thyroid disease     Past Surgical History:  Procedure Laterality Date   ABDOMINAL HYSTERECTOMY     COLONOSCOPY     KNEE SURGERY     SHOULDER SURGERY     TONSILLECTOMY     TOTAL KNEE ARTHROPLASTY Left 01/15/2019   Procedure: TOTAL KNEE ARTHROPLASTY;  Surgeon: Sydnee Cabal, MD;  Location: [facility];  Service: Orthopedics;  Laterality: Left;    Social History:  reports that she has never smoked. She has never used smokeless tobacco. She reports current alcohol use. She reports that she does not use drugs.  Allergies: Allergies  Allergen Reactions   Aspirin     REACTION: asthma- on respirator for 28 days   Codeine Phosphate Hives   Penicillins Hives    REACTION: wheezing and hives  Did it involve swelling of the face/tongue/throat, SOB, or low BP? no Did it involve sudden or severe rash/hives, skin peeling, or any reaction on the inside of your mouth or nose? No Did you need to seek medical attention at a hospital or doctor's office? no When did it last happen?      Childhood If all above answers are "NO", may proceed with cephalosporin use.     Family History:  Family History  Problem Relation Age of Onset   Asthma Mother        seasonal   Colon cancer Mother    Lung cancer Maternal Uncle    Esophageal cancer Father        smoker, mets to lungs   Colon polyps Father    Diabetes Sister    Rectal cancer Neg Hx    Stomach cancer Neg Hx      Current Outpatient Medications:    albuterol (VENTOLIN HFA) 108 (90 Base) MCG/ACT inhaler, INHALE 2 INHALATIONS BY MOUTH EVERY 6 HOURS AS NEEDED, Disp: 8.5 g, Rfl: 2   amphetamine-dextroamphetamine (ADDERALL) 10 MG tablet, Take 1 tablet (10 mg total) by mouth 2 (two) times daily., Disp: 60 tablet, Rfl: 0   amphetamine-dextroamphetamine (ADDERALL) 10 MG tablet, Take 1 tablet (10 mg total) by mouth 2 (two) times daily with a meal., Disp: 60 tablet, Rfl: 0   amphetamine-dextroamphetamine (ADDERALL) 10 MG tablet, Take 1 tablet (10 mg total) by mouth 2 (two) times daily., Disp: 60 tablet, Rfl: 0   atorvastatin (LIPITOR) 20 MG tablet, TAKE 1 TABLET(20 MG) BY MOUTH DAILY, Disp: 90 tablet, Rfl: 0   budesonide-formoterol (SYMBICORT) 160-4.5 MCG/ACT inhaler, Inhale 2 puffs into the lungs 2 (two) times daily., Disp: 30.6 g, Rfl:  3   levothyroxine (SYNTHROID) 75 MCG tablet, Take 75 mcg by mouth daily before breakfast., Disp: , Rfl:    montelukast (SINGULAIR) 10 MG tablet, TAKE 1 TABLET(10 MG) BY MOUTH AT BEDTIME, Disp: 90 tablet, Rfl: 3   pantoprazole (PROTONIX) 40 MG tablet, TAKE 1 TABLET BY MOUTH 30 TO 60 MINUTES BEFORE FIRST MEAL OF THE DAY, Disp: 90 tablet, Rfl: 3   tirzepatide (MOUNJARO) 10 MG/0.5ML Pen, Inject 10 mg into the skin once a week., Disp: 6 mL, Rfl: 0  Review of Systems:  Constitutional: Denies fever, chills, diaphoresis, appetite change and fatigue.  HEENT: Denies photophobia, eye pain, redness, hearing loss, ear pain, congestion, sore throat, rhinorrhea, sneezing, mouth sores, trouble swallowing, neck pain, neck stiffness and tinnitus.   Respiratory: Denies SOB, DOE, cough, chest  tightness,  and wheezing.   Cardiovascular: Denies chest pain, palpitations and leg swelling.  Gastrointestinal: Denies nausea, vomiting, abdominal pain, diarrhea, constipation, blood in stool and abdominal distention.  Genitourinary: Denies dysuria, urgency, frequency, hematuria, flank pain and difficulty urinating.  Endocrine: Denies: hot or cold intolerance, sweats, changes in hair or nails, polyuria, polydipsia. Musculoskeletal: Denies myalgias, back pain, joint swelling, arthralgias and gait problem.  Skin: Denies pallor, rash and wound.  Neurological: Denies dizziness, seizures, syncope, weakness, light-headedness, numbness and headaches.  Hematological: Denies adenopathy. Easy bruising, personal or family bleeding history  Psychiatric/Behavioral: Denies suicidal ideation, mood changes, confusion, nervousness, sleep disturbance and agitation    Physical Exam: Vitals:   11/05/21 0808  BP: 124/88  Pulse: 82  Temp: 97.8 F (36.6 C)  TempSrc: Oral  SpO2: 99%  Weight: 169 lb 1.6 oz (76.7 kg)    Body mass index is 29.95 kg/m.   Constitutional: NAD, calm, comfortable Eyes: PERRL, lids and conjunctivae normal, wears corrective lenses ENMT: Mucous membranes are moist.  Respiratory: clear to auscultation bilaterally, no wheezing, no crackles. Normal respiratory effort. No accessory muscle use.  Cardiovascular: Regular rate and rhythm, no murmurs / rubs / gallops. No extremity edema.  Psychiatric: Normal judgment and insight. Alert and oriented x 3. Normal mood.    Impression and Plan:  Hyperlipidemia associated with type 2 diabetes mellitus ([place]) -Will request recent labs from endocrinology office, currently on atorvastatin 20 mg daily.  Type 2 diabetes mellitus with other specified complication, without long-term current use of insulin ([place])  - Plan: POCT glycosylated hemoglobin (Hb A1C), Urine microalbumin-creatinine with uACR, tirzepatide (MOUNJARO) 10 MG/0.5ML Pen -A1c  demonstrates excellent control at 5.3. -Increase Mounjaro to 10 mg, check microalbumin.  She is due for eye exam.  Attention deficit disorder, unspecified hyperactivity presence -PDMP reviewed, no red flags, overdose risk score is 0. -She is not requesting Adderall refills today.    Time spent:32 minutes reviewing chart, interviewing and examining patient and formulating plan of care.    Lelon Frohlich, MD [facility]

## 2021-11-06 ENCOUNTER — Encounter: Payer: Self-pay | Admitting: Internal Medicine

## 2021-11-07 ENCOUNTER — Encounter: Payer: Self-pay | Admitting: Internal Medicine

## 2021-12-08 LAB — HM DIABETES EYE EXAM

## 2022-01-13 ENCOUNTER — Other Ambulatory Visit: Payer: Self-pay | Admitting: Internal Medicine

## 2022-01-13 DIAGNOSIS — E1169 Type 2 diabetes mellitus with other specified complication: Secondary | ICD-10-CM

## 2022-01-13 DIAGNOSIS — F988 Other specified behavioral and emotional disorders with onset usually occurring in childhood and adolescence: Secondary | ICD-10-CM

## 2022-01-14 MED ORDER — AMPHETAMINE-DEXTROAMPHETAMINE 10 MG PO TABS: 10.0000 mg | ORAL_TABLET | Freq: Two times a day (BID) | ORAL | 0 refills | Status: DC

## 2022-01-14 NOTE — Telephone Encounter (Signed)
Patient had an appointment 11/05/21

## 2022-01-28 ENCOUNTER — Telehealth: Payer: Self-pay | Admitting: *Deleted

## 2022-01-28 NOTE — Telephone Encounter (Signed)
Prior Carrie [name] has been started for [facility]: [id number]

## 2022-01-29 NOTE — Telephone Encounter (Signed)
PA was approved.  Coverage End Date:01/28/2023

## 2022-02-11 ENCOUNTER — Other Ambulatory Visit: Payer: Self-pay | Admitting: Internal Medicine

## 2022-02-11 DIAGNOSIS — E1169 Type 2 diabetes mellitus with other specified complication: Secondary | ICD-10-CM

## 2022-04-04 ENCOUNTER — Ambulatory Visit: Payer: BC Managed Care – PPO | Admitting: Obstetrics & Gynecology

## 2022-04-14 ENCOUNTER — Other Ambulatory Visit: Payer: Self-pay | Admitting: Internal Medicine

## 2022-04-14 DIAGNOSIS — E1169 Type 2 diabetes mellitus with other specified complication: Secondary | ICD-10-CM

## 2022-04-14 DIAGNOSIS — F988 Other specified behavioral and emotional disorders with onset usually occurring in childhood and adolescence: Secondary | ICD-10-CM

## 2022-04-15 ENCOUNTER — Other Ambulatory Visit: Payer: Self-pay | Admitting: Internal Medicine

## 2022-04-15 DIAGNOSIS — E1169 Type 2 diabetes mellitus with other specified complication: Secondary | ICD-10-CM

## 2022-04-15 DIAGNOSIS — F988 Other specified behavioral and emotional disorders with onset usually occurring in childhood and adolescence: Secondary | ICD-10-CM

## 2022-04-15 MED ORDER — MOUNJARO 10 MG/0.5ML ~~LOC~~ SOAJ: SUBCUTANEOUS | 0 refills | Status: DC

## 2022-04-15 MED ORDER — AMPHETAMINE-DEXTROAMPHETAMINE 10 MG PO TABS: 10.0000 mg | ORAL_TABLET | Freq: Two times a day (BID) | ORAL | 0 refills | Status: DC

## 2022-05-27 ENCOUNTER — Encounter: Payer: Self-pay | Admitting: Internal Medicine

## 2022-05-27 MED ORDER — TIRZEPATIDE 7.5 MG/0.5ML ~~LOC~~ SOAJ: 7.5000 mg | SUBCUTANEOUS | 3 refills | Status: DC

## 2022-06-17 ENCOUNTER — Encounter: Payer: Self-pay | Admitting: Obstetrics & Gynecology

## 2022-06-17 ENCOUNTER — Ambulatory Visit (INDEPENDENT_AMBULATORY_CARE_PROVIDER_SITE_OTHER): Payer: BC Managed Care – PPO | Admitting: Obstetrics & Gynecology

## 2022-06-17 ENCOUNTER — Ambulatory Visit
Admission: RE | Admit: 2022-06-17 | Discharge: 2022-06-17 | Disposition: A | Discharge status: Home / Self Care | Payer: BC Managed Care – PPO | Source: Ambulatory Visit | Attending: Internal Medicine | Admitting: Internal Medicine

## 2022-06-17 ENCOUNTER — Other Ambulatory Visit: Payer: Self-pay | Admitting: Internal Medicine

## 2022-06-17 VITALS — BP 128/86 | HR 89 | Resp 98 | Ht 62.5 in | Wt 156.0 lb

## 2022-06-17 DIAGNOSIS — Z90711 Acquired absence of uterus with remaining cervical stump: Secondary | ICD-10-CM

## 2022-06-17 DIAGNOSIS — Z01419 Encounter for gynecological examination (general) (routine) without abnormal findings: Secondary | ICD-10-CM

## 2022-06-17 DIAGNOSIS — Z1231 Encounter for screening mammogram for malignant neoplasm of breast: Secondary | ICD-10-CM

## 2022-06-17 DIAGNOSIS — Z780 Asymptomatic menopausal state: Secondary | ICD-10-CM | POA: Diagnosis not present

## 2022-06-17 NOTE — Progress Notes (Signed)
Carrie [name] 12-Oct-1960 [id number]   History:    62 y.o.  G2P2L2   RP:  Established patient presenting for annual gyn exam    HPI: Postmenopausal. Prior Robotic supracervical hysterectomy in 2007 for uterine fibroids (I assisted Dr Maryelizabeth Rowan).  No significant hot flashes or night sweats.  No vaginal bleeding.  No pelvic pain.  Pap smear Neg/HPV HR Neg in 03/2020.  No h/o abnormal Pap.  Will repeat Pap at 3 years.  Breasts normal.  Mammo done today, pending results.  Colono 05/2020.  BD Normal 08/2020.  BMI 28.08, lost weight x last year.  Had knee replacement in 2021.  Health labs with Fam MD.   Past medical history,surgical history, family history and social history were all reviewed and documented in the EPIC chart.  Gynecologic History No LMP recorded. Patient has had a hysterectomy.  Obstetric History OB History  Gravida Para Term Preterm AB Living  2       0 2  SAB IAB Ectopic Multiple Live Births  0   0 0      # Outcome Date GA Lbr Len/2nd Weight Sex Delivery Anes PTL Lv  2 Gravida           1 Gravida              ROS: A ROS was performed and pertinent positives and negatives are included in the history. GENERAL: No fevers or chills. HEENT: No change in vision, no earache, sore throat or sinus congestion. NECK: No pain or stiffness. CARDIOVASCULAR: No chest pain or pressure. No palpitations. PULMONARY: No shortness of breath, cough or wheeze. GASTROINTESTINAL: No abdominal pain, nausea, vomiting or diarrhea, melena or bright red blood per rectum. GENITOURINARY: No urinary frequency, urgency, hesitancy or dysuria. MUSCULOSKELETAL: No joint or muscle pain, no back pain, no recent trauma. DERMATOLOGIC: No rash, no itching, no lesions. ENDOCRINE: No polyuria, polydipsia, no heat or cold intolerance. No recent change in weight. HEMATOLOGICAL: No anemia or easy bruising or bleeding. NEUROLOGIC: No headache, seizures, numbness, tingling or weakness. PSYCHIATRIC: No depression, no loss of  interest in normal activity or change in sleep pattern.     Exam:   BP 128/86 (BP Location: Right Arm, Patient Position: Sitting, Cuff Size: Normal)   Pulse 89   Resp (!) 98   Ht 5' 2.5" (1.588 m)   Wt 156 lb (70.8 kg)   BMI 28.08 kg/m   Body mass index is 28.08 kg/m.  General appearance : Well developed well nourished female. No acute distress HEENT: Eyes: no retinal hemorrhage or exudates,  Neck supple, trachea midline, no carotid bruits, no thyroidmegaly Lungs: Clear to auscultation, no rhonchi or wheezes, or rib retractions  Heart: Regular rate and rhythm, no murmurs or gallops Breast:Examined in sitting and supine position were symmetrical in appearance, no palpable masses or tenderness,  no skin retraction, no nipple inversion, no nipple discharge, no skin discoloration, no axillary or supraclavicular lymphadenopathy Abdomen: no palpable masses or tenderness, no rebound or guarding Extremities: no edema or skin discoloration or tenderness  Pelvic: Vulva: Normal             Vagina: No gross lesions or discharge  Cervix: No gross lesions or discharge  Uterus Absent (Supracervical Hysterectomy)  Adnexa  Without masses or tenderness  Anus: Normal   Assessment/Plan:  62 y.o. female for annual exam   1. Well female exam with routine gynecological exam Postmenopausal. Prior Robotic supracervical hysterectomy in 2007 for uterine fibroids (  I assisted Dr Maryelizabeth Rowan).  No significant hot flashes or night sweats.  No vaginal bleeding.  No pelvic pain.  Pap smear Neg/HPV HR Neg in 03/2020.  No h/o abnormal Pap.  Will repeat Pap at 3 years.  Breasts normal.  Mammo done today, pending results.  Colono 05/2020.  BD Normal 08/2020.  BMI 28.08, lost weight x last year.  Had knee replacement in 2021.  Health labs with Fam MD.  2. History of hysterectomy, supracervical  3. Postmenopause  Postmenopausal. No significant hot flashes or night sweats.  No vaginal bleeding.  No pelvic pain. Prior  Robotic supracervical hysterectomy in 2007 for uterine fibroids (I assisted Dr Maryelizabeth Rowan).   Princess Bruins MD, 3:33 PM

## 2022-06-25 ENCOUNTER — Encounter: Payer: Self-pay | Admitting: Plastic Surgery

## 2022-06-25 ENCOUNTER — Ambulatory Visit (INDEPENDENT_AMBULATORY_CARE_PROVIDER_SITE_OTHER): Payer: Self-pay | Admitting: Plastic Surgery

## 2022-06-25 VITALS — BP 123/78 | HR 84 | Ht 62.5 in | Wt 154.0 lb

## 2022-06-25 DIAGNOSIS — Z719 Counseling, unspecified: Secondary | ICD-10-CM

## 2022-06-25 NOTE — Progress Notes (Signed)

## 2022-07-08 ENCOUNTER — Other Ambulatory Visit: Payer: Self-pay | Admitting: Internal Medicine

## 2022-07-08 DIAGNOSIS — F988 Other specified behavioral and emotional disorders with onset usually occurring in childhood and adolescence: Secondary | ICD-10-CM

## 2022-07-09 ENCOUNTER — Other Ambulatory Visit: Payer: Self-pay | Admitting: Internal Medicine

## 2022-07-09 DIAGNOSIS — F988 Other specified behavioral and emotional disorders with onset usually occurring in childhood and adolescence: Secondary | ICD-10-CM

## 2022-07-09 MED ORDER — AMPHETAMINE-DEXTROAMPHETAMINE 10 MG PO TABS: 10.0000 mg | ORAL_TABLET | Freq: Two times a day (BID) | ORAL | 0 refills | Status: DC

## 2022-07-09 NOTE — Telephone Encounter (Signed)
Left message on machine for patient to return our call.  Patient needs an office visit.  Can be virtual.

## 2022-07-10 MED ORDER — AMPHETAMINE-DEXTROAMPHETAMINE 10 MG PO TABS: 10.0000 mg | ORAL_TABLET | Freq: Two times a day (BID) | ORAL | 0 refills | Status: DC

## 2022-07-12 ENCOUNTER — Other Ambulatory Visit: Payer: Self-pay | Admitting: Internal Medicine

## 2022-07-12 DIAGNOSIS — E1169 Type 2 diabetes mellitus with other specified complication: Secondary | ICD-10-CM

## 2022-07-15 NOTE — Progress Notes (Signed)
Subjective:    Patient ID: Carrie [name], female   DOB: Apr 29, 1960    MRN: [id number]  Brief patient profile:  41 yobf never smoker onset asthma age 62 and placed on shots for "everything that grows" needed to stay on inhalers / complicated by 3 admits requiring ventilator last time around age 73 and overall much better since then but intermittently on prednisone and as of 2011 required daily prednisone so referred to pulmonary clinic 02/13/2016 by Dr   Malka So allergy shots but off  since around 2000    History of Present Illness  02/13/2016 1st [facility] office visit/ Shawnmichael Parenteau  maint rx qvar 80 2bid/ singiulair/ pred 10  Chief Complaint  Patient presents with   Pulmonary Consult    Referred by Dr. Tawanna Cooler for eval of Asthma. Pt states that she has been on prednisone for years for her asthma. She is currently not having any symptoms.   for the last six years only comes off prednisone for a few days then back on it due to wheeze/ sob s much cough and presently on 10 mg daily and qvar 80 2 every 12 hours and no saba x sev weeks   Apparently doesn't taper below 10 in any increment Has intermittent hb on prn ppi  rec Pantoprazole (protonix) 40 mg   Take  30-60 min before first meal of the day and Pepcid (famotidine)  20 mg one @  bedtime until return to office    Qvar 80 Take 2 puffs first thing in am and then another 2 puffs about 12 hours later.  Work on inhaler technique:     Only use your albuterol as a rescue medication  Try Prednisone 5 mg daily x 2 weeks then  5 alternating with 2.5 mg (one-half pill) - if worse resume previous dose     04/16/2016  f/u ov/Daeron Carreno re: steroid dep asthma with pred down to 5 mg daily / qvar 80 2bid / singulair /gerd rx  Chief Complaint  Patient presents with   Follow-up    Breathing is overall doing well. She c/o minimal congestion "behind my nose"- just started today. She is on pred 5 mg daily and has not went any lower than that. She  has not had to use her proair.   Not limited by breathing from desired activities  / no saba use at all  Some nausea at the lower doses of pred but very happy with wt loss on the lower doses rec Take 5 mg one half daily x 2 weeks then one half even days x 2 weeks then one half every 3 days x 2 weeks and stop. If get worse nausea/ loss of appetite at any point go back up to higher dose  and alternate doses even/odd days   03/19/2018  f/u ov/Naftuli Dalsanto re: chronic asthma / off pred mid Dec 2019 / maint symb 160 2bid and no rescue on hand  Chief Complaint  Patient presents with   Follow-up    Breathing is doing well and she has not used her albuterol inhaler recently. No new co's.   Dyspnea:  Not limited by breathing from desired activities / but by L knee Cough: no issue  Sleeping: ok now flat/ one pillow  SABA use: no rescue  rec Plan A = Automatic = symbicort 160 Take 2 puffs first thing in am and then another 2 puffs about 12 hours later.  Plan B = Backup Only use  your albuterol inhaler as a rescue medication Plan C = Crisis - only use your albuterol nebulizer if you first try Plan B and it fails to help > ok to use the nebulizer up to every 4 hours but if start needing it regularly call for immediate appointment  03/22/2019  f/u ov/Mccartney Brucks re: asthma s/p covid infection outpt rx  Chief Complaint  Patient presents with   Follow-up    Asthma, no symptoms  Dyspnea:  Not limited by breathing from desired activities   Cough: none  Sleeping: poorly due to insomnia since covid  but no resp c/o's on one pillow SABA use: hfa rarely 02: none  rec Refer to allergy >never went  No change in medications Weight control is a matter of calorie balance  After spring season, if you are doing fine on the 160 you can try the 80       05/21/2021  f/u ov/Wolf Boulay re: annual eval of previously poorly controlled  asthma no longer pred dep/ symbicort 160 1 bid and singulair with no flares over last year  Chief  Complaint  Patient presents with   Follow-up    No concerns at this time  Dyspnea:  walking on job, steps are fine  Cough: tolerable  Sleeping: no resp cc  < 30 cc electric bed  SABA use: none  02: none  Covid status:   twice infected/ 3 x infected  Rec Plan A = Automatic = Always=    symbicort 1-2 every 12 hours and singulair each pm  Plan B = Backup (to supplement plan A, not to replace it) Only use your albuterol inhaler as a rescue medication  In the event of a flare of your symptoms > symbicort 160 is 2 every 12 hours for at least a week then taper   Please schedule a follow up visit in 12  months but call sooner if needed    07/16/2022  f/u ov/Shantana Christon re:  asthma  maint on symbicort 160 2 daily  / singulair  Chief Complaint  Patient presents with   Follow-up    Doing well.  Dyspnea:  improved with wt loss  Cough: none  Sleeping: no resp cc SABA use: none      No obvious day to day or daytime variability or assoc excess/ purulent sputum or mucus plugs or hemoptysis or cp or chest tightness, subjective wheeze or overt sinus or hb symptoms.   Sleeping  without nocturnal  or early am exacerbation  of respiratory  c/o's or need for noct saba. Also denies any obvious fluctuation of symptoms with weather or environmental changes or other aggravating or alleviating factors except as outlined above   No unusual exposure hx or h/o childhood pna/   knowledge of premature birth.  Current Allergies, Complete Past Medical History, Past Surgical History, Family History, and Social History were reviewed in [employer] record.  ROS  The following are not active complaints unless bolded Hoarseness, sore throat, dysphagia, dental problems, itching, sneezing,  nasal congestion or discharge of excess mucus or purulent secretions, ear ache,   fever, chills, sweats, unintended wt loss or wt gain, classically pleuritic or exertional cp,  orthopnea pnd or arm/hand swelling   or leg swelling, presyncope, palpitations, abdominal pain, anorexia, nausea, vomiting, diarrhea  or change in bowel habits or change in bladder habits, change in stools or change in urine, dysuria, hematuria,  rash, arthralgias, visual complaints, headache, numbness, weakness or ataxia or problems with walking  or coordination,  change in mood or  memory.        Current Meds  Medication Sig   albuterol (VENTOLIN HFA) 108 (90 Base) MCG/ACT inhaler INHALE 2 INHALATIONS BY MOUTH EVERY 6 HOURS AS NEEDED   amphetamine-dextroamphetamine (ADDERALL) 10 MG tablet Take 1 tablet (10 mg total) by mouth 2 (two) times daily.   atorvastatin (LIPITOR) 20 MG tablet TAKE 1 TABLET(20 MG) BY MOUTH DAILY   levothyroxine (SYNTHROID) 75 MCG tablet Take 75 mcg by mouth daily before breakfast.   pantoprazole (PROTONIX) 40 MG tablet TAKE 1 TABLET BY MOUTH 30 TO 60 MINUTES BEFORE FIRST MEAL OF THE DAY   tirzepatide (MOUNJARO) 7.5 MG/0.5ML Pen Inject 7.5 mg into the skin once a week.   [DISCONTINUED] budesonide-formoterol (SYMBICORT) 160-4.5 MCG/ACT inhaler Inhale 2 puffs into the lungs 2 (two) times daily.   [DISCONTINUED] montelukast (SINGULAIR) 10 MG tablet TAKE 1 TABLET(10 MG) BY MOUTH AT BEDTIME                     Objective:   Physical Exam   Wt  07/16/2022        154  05/21/2021         184  03/21/2020         201 03/22/2019         197   09/17/2018        203  03/19/2018         206  02/20/2018       203  12/27/2016     173  06/26/2016        188  04/16/2016       191   02/13/16 203 lb 3.2 oz (92.2 kg)  02/05/16 200 lb (90.7 kg)  01/24/16 200 lb (90.7 kg)    Vital signs reviewed  07/16/2022  - Note at rest 02 sats  100% on RA   General appearance:  amb pleasant bf   all smiles    HEENT : Oropharynx  clear   NECK :  without  apparent JVD/ palpable Nodes/TM    LUNGS: no acc muscle use,  Nl contour chest which is clear to A and P bilaterally without cough on insp or exp maneuvers   CV:  RRR  no s3 or  murmur or increase in P2, and no edema   ABD:  soft and nontender      NEURO:  alert, approp, nl sensorium with  no motor or cerebellar deficits apparent.            Assessment:

## 2022-07-16 ENCOUNTER — Encounter: Payer: Self-pay | Admitting: Internal Medicine

## 2022-07-16 ENCOUNTER — Ambulatory Visit (INDEPENDENT_AMBULATORY_CARE_PROVIDER_SITE_OTHER): Payer: BC Managed Care – PPO | Admitting: Internal Medicine

## 2022-07-16 VITALS — BP 125/75 | HR 88 | Temp 98.1°F | Wt 154.6 lb

## 2022-07-16 VITALS — BP 120/74 | HR 80 | Temp 98.2°F | Ht 63.0 in | Wt 154.6 lb

## 2022-07-16 DIAGNOSIS — K219 Gastro-esophageal reflux disease without esophagitis: Secondary | ICD-10-CM | POA: Diagnosis not present

## 2022-07-16 DIAGNOSIS — J4530 Mild persistent asthma, uncomplicated: Secondary | ICD-10-CM

## 2022-07-16 DIAGNOSIS — Z7985 Long-term (current) use of injectable non-insulin antidiabetic drugs: Secondary | ICD-10-CM

## 2022-07-16 DIAGNOSIS — E559 Vitamin D deficiency, unspecified: Secondary | ICD-10-CM

## 2022-07-16 DIAGNOSIS — E119 Type 2 diabetes mellitus without complications: Secondary | ICD-10-CM | POA: Insufficient documentation

## 2022-07-16 DIAGNOSIS — E1169 Type 2 diabetes mellitus with other specified complication: Secondary | ICD-10-CM

## 2022-07-16 DIAGNOSIS — E785 Hyperlipidemia, unspecified: Secondary | ICD-10-CM

## 2022-07-16 DIAGNOSIS — F988 Other specified behavioral and emotional disorders with onset usually occurring in childhood and adolescence: Secondary | ICD-10-CM | POA: Diagnosis not present

## 2022-07-16 LAB — POCT GLYCOSYLATED HEMOGLOBIN (HGB A1C): Hemoglobin A1C: 5.1 % (ref 4.0–5.6)

## 2022-07-16 MED ORDER — BUDESONIDE-FORMOTEROL FUMARATE 160-4.5 MCG/ACT IN AERO: 2.0000 | INHALATION_SPRAY | Freq: Two times a day (BID) | RESPIRATORY_TRACT | 3 refills | Status: DC

## 2022-07-16 NOTE — Patient Instructions (Signed)
No change in medications except try off singulair to see if you any difference   Please schedule a follow up visit in 12 months but call sooner if needed

## 2022-07-16 NOTE — Assessment & Plan Note (Signed)
Has been stable now for some time, continues to follow chronically with Dr. Sherene Sires.

## 2022-07-16 NOTE — Assessment & Plan Note (Signed)
On atorvastatin 20 mg daily, she is due for lipid recheck.

## 2022-07-16 NOTE — Assessment & Plan Note (Signed)
A1c of 5.1 demonstrates excellent diabetic management.  Continue Mounjaro, congratulated on weight loss. 

## 2022-07-16 NOTE — Assessment & Plan Note (Signed)
A1c of 5.1 demonstrates excellent diabetic management.  Continue Mounjaro, congratulated on weight loss.

## 2022-07-16 NOTE — Assessment & Plan Note (Signed)
Onset age 62 Spirometry 02/13/2016  Nl x mid flows reduced to 56% and min curvature  pre - saba on qvar 80 2bid and pred 10 mg daily > tapered off as of 06/26/2016 only on physiologic steroids per dr Janus Molder guidance  - 12/27/2016     continue qvar 80 2bid - Spirometry 02/20/2018  FEV1 1.6 (78%)  Ratio 68 with mild curvature > 6 h since last saba  - FENO 02/20/2018  =   147 on qvar 80 2bid  - 02/20/2018    try step up to symb 160 2bid from qvar and off chronic pred mid dec 2019 - 09/17/2018  After extensive coaching inhaler device,  effectiveness =    90% and continue singulair  All goals of chronic asthma control met including optimal function and elimination of symptoms with minimal need for rescue therapy.  Contingencies discussed in full including contacting this office immediately if not controlling the symptoms using the rule of two's.     Ok to try off singulair at this point to see if any upper or lower resp symptoms flare  F/u can be yearly, call sooner prn          Each maintenance medication was reviewed in detail including emphasizing most importantly the difference between maintenance and prns and under what circumstances the prns are to be triggered using an action plan format where appropriate.  Total time for H and P, chart review, counseling, reviewing hfa  device(s) and generating customized AVS unique to this office visit / same day charting = 20

## 2022-07-16 NOTE — Progress Notes (Signed)
Established Patient Office Visit     CC/Reason for Visit: Follow-up chronic conditions  HPI: Carrie [name] is a 62 y.o. female who is coming in today for the above mentioned reasons. Past Medical History is significant for: Type 2 diabetes, hyperlipidemia, obesity, vitamin D deficiency, ADHD and asthma.  She has been well-controlled now for some time.  She has lost some weight and Mounjaro.  She has no acute concerns.   Past Medical/Surgical History: Past Medical History:  Diagnosis Date   A-fib (HCC)    ADD (attention deficit disorder)    Adrenal abnormality (HCC)    Anemia, iron deficiency    Asthma    Fibromyalgia    GERD (gastroesophageal reflux disease)    Hiatal hernia    Hyperlipidemia    Numbness and tingling    Obese    Thyroid disease     Past Surgical History:  Procedure Laterality Date   ABDOMINAL HYSTERECTOMY     COLONOSCOPY     KNEE SURGERY     SHOULDER SURGERY     TONSILLECTOMY     TOTAL KNEE ARTHROPLASTY Left 01/15/2019   Procedure: TOTAL KNEE ARTHROPLASTY;  Surgeon: Eugenia Mcalpine, MD;  Location: [facility];  Service: Orthopedics;  Laterality: Left;    Social History:  reports that she has never smoked. She has never used smokeless tobacco. She reports current alcohol use. She reports that she does not use drugs.  Allergies: Allergies  Allergen Reactions   Aspirin     REACTION: asthma- on respirator for 28 days   Codeine Phosphate Hives   Penicillins Hives    REACTION: wheezing and hives  Did it involve swelling of the face/tongue/throat, SOB, or low BP? no Did it involve sudden or severe rash/hives, skin peeling, or any reaction on the inside of your mouth or nose? No Did you need to seek medical attention at a hospital or doctor's office? no When did it last happen?      Childhood If all above answers are "NO", may proceed with cephalosporin use.    Family History:  Family History  Problem Relation Age of Onset   Asthma Mother         seasonal   Colon cancer Mother    Esophageal cancer Father        smoker, mets to lungs   Colon polyps Father    Diabetes Sister    Lung cancer Maternal Uncle    Rectal cancer Neg Hx    Stomach cancer Neg Hx    Breast cancer Neg Hx      Current Outpatient Medications:    albuterol (VENTOLIN HFA) 108 (90 Base) MCG/ACT inhaler, INHALE 2 INHALATIONS BY MOUTH EVERY 6 HOURS AS NEEDED, Disp: 8.5 g, Rfl: 2   amphetamine-dextroamphetamine (ADDERALL) 10 MG tablet, Take 1 tablet (10 mg total) by mouth 2 (two) times daily with a meal., Disp: 60 tablet, Rfl: 0   amphetamine-dextroamphetamine (ADDERALL) 10 MG tablet, Take 1 tablet (10 mg total) by mouth 2 (two) times daily., Disp: 60 tablet, Rfl: 0   amphetamine-dextroamphetamine (ADDERALL) 10 MG tablet, Take 1 tablet (10 mg total) by mouth 2 (two) times daily., Disp: 60 tablet, Rfl: 0   atorvastatin (LIPITOR) 20 MG tablet, TAKE 1 TABLET(20 MG) BY MOUTH DAILY, Disp: 90 tablet, Rfl: 0   budesonide-formoterol (SYMBICORT) 160-4.5 MCG/ACT inhaler, Inhale 2 puffs into the lungs 2 (two) times daily., Disp: 30.6 g, Rfl: 3   levothyroxine (SYNTHROID) 75 MCG tablet, Take 75  mcg by mouth daily before breakfast., Disp: , Rfl:    montelukast (SINGULAIR) 10 MG tablet, TAKE 1 TABLET(10 MG) BY MOUTH AT BEDTIME, Disp: 90 tablet, Rfl: 3   pantoprazole (PROTONIX) 40 MG tablet, TAKE 1 TABLET BY MOUTH 30 TO 60 MINUTES BEFORE FIRST MEAL OF THE DAY, Disp: 90 tablet, Rfl: 3   tirzepatide (MOUNJARO) 7.5 MG/0.5ML Pen, Inject 7.5 mg into the skin once a week., Disp: 6 mL, Rfl: 3  Review of Systems:  Negative unless indicated in HPI.   Physical Exam: Vitals:   07/16/22 1412  BP: 125/75  Pulse: 88  Temp: 98.1 F (36.7 C)  TempSrc: Oral  SpO2: 96%  Weight: 154 lb 9.6 oz (70.1 kg)    Body mass index is 27.83 kg/m.   Physical Exam Vitals reviewed.  Constitutional:      Appearance: Normal appearance.  HENT:     Head: Normocephalic and atraumatic.  Eyes:      Conjunctiva/sclera: Conjunctivae normal.     Pupils: Pupils are equal, round, and reactive to light.  Cardiovascular:     Rate and Rhythm: Normal rate and regular rhythm.  Pulmonary:     Effort: Pulmonary effort is normal.     Breath sounds: Normal breath sounds.  Skin:    General: Skin is warm and dry.  Neurological:     General: No focal deficit present.     Mental Status: She is alert and oriented to person, place, and time.  Psychiatric:        Mood and Affect: Mood normal.        Behavior: Behavior normal.        Thought Content: Thought content normal.        Judgment: Judgment normal.      Impression and Plan:  Type 2 diabetes mellitus with other specified complication, without long-term current use of insulin (HCC) Assessment & Plan: A1c of 5.1 demonstrates excellent diabetic management.  Continue Mounjaro, congratulated on weight loss.  Orders: -     POCT glycosylated hemoglobin (Hb A1C)  Attention deficit disorder, unspecified hyperactivity presence Assessment & Plan: PDMP reviewed, no red flags, overdose risk score is 160.  She is not needing Adderall refills today.   Mild persistent asthma, uncomplicated Assessment & Plan: Has been stable now for some time, continues to follow chronically with Dr. Sherene Sires.   Gastroesophageal reflux disease with hiatal hernia  Hyperlipidemia associated with type 2 diabetes mellitus (HCC) Assessment & Plan: On atorvastatin 20 mg daily, she is due for lipid recheck.   Vitamin D deficiency     Time spent:31 minutes reviewing chart, interviewing and examining patient and formulating plan of care.     Chaya Jan, MD [facility]

## 2022-07-16 NOTE — Assessment & Plan Note (Signed)
PDMP reviewed, no red flags, overdose risk score is 160.  She is not needing Adderall refills today.

## 2022-08-14 DIAGNOSIS — E2749 Other adrenocortical insufficiency: Secondary | ICD-10-CM | POA: Diagnosis not present

## 2022-08-14 DIAGNOSIS — E119 Type 2 diabetes mellitus without complications: Secondary | ICD-10-CM | POA: Diagnosis not present

## 2022-08-14 DIAGNOSIS — E7800 Pure hypercholesterolemia, unspecified: Secondary | ICD-10-CM | POA: Diagnosis not present

## 2022-08-14 DIAGNOSIS — E039 Hypothyroidism, unspecified: Secondary | ICD-10-CM | POA: Diagnosis not present

## 2022-08-16 ENCOUNTER — Encounter: Payer: Self-pay | Admitting: Internal Medicine

## 2022-08-21 ENCOUNTER — Other Ambulatory Visit (HOSPITAL_COMMUNITY): Payer: Self-pay | Admitting: Endocrinology

## 2022-08-21 DIAGNOSIS — E2749 Other adrenocortical insufficiency: Secondary | ICD-10-CM | POA: Diagnosis not present

## 2022-08-21 DIAGNOSIS — E039 Hypothyroidism, unspecified: Secondary | ICD-10-CM | POA: Diagnosis not present

## 2022-08-21 DIAGNOSIS — E7800 Pure hypercholesterolemia, unspecified: Secondary | ICD-10-CM | POA: Diagnosis not present

## 2022-08-21 DIAGNOSIS — E119 Type 2 diabetes mellitus without complications: Secondary | ICD-10-CM

## 2022-09-18 ENCOUNTER — Telehealth: Payer: Self-pay | Admitting: Internal Medicine

## 2022-09-18 MED ORDER — TIRZEPATIDE 7.5 MG/0.5ML ~~LOC~~ SOAJ: 7.5000 mg | SUBCUTANEOUS | 3 refills | Status: DC

## 2022-09-18 NOTE — Telephone Encounter (Signed)
Prescription Request  09/18/2022  LOV: 07/16/2022  What is the name of the medication or equipment?  tirzepatide [facility]) 7.5 MG/0.5ML Pen   Pt states [facility] currently has this in stock. Pt says she is supposed to be on 10 mg. Pt was informed that MD is OOO. Pt is completely out and needs this by Sunday. Please advise.    Have you contacted your pharmacy to request a refill? Yes   Which pharmacy would you like this sent to?   [id number] - [place], [place] - [address] [place] [place] [address] Phone: [phone] Fax: [phone]   Patient notified that their request is being sent to the clinical staff for review and that they should receive a response within 2-3 business days.   Please advise at Mobile [phone] (mobile)

## 2022-09-18 NOTE — Telephone Encounter (Signed)
Pt stated that they have the 7.5 in stock they just need a refill order. Order sent to [facility]. Pt verbalized understanding.

## 2022-09-28 ENCOUNTER — Encounter: Payer: Self-pay | Admitting: Internal Medicine

## 2022-09-29 DIAGNOSIS — R21 Rash and other nonspecific skin eruption: Secondary | ICD-10-CM | POA: Diagnosis not present

## 2022-10-08 ENCOUNTER — Encounter: Payer: Self-pay | Admitting: Internal Medicine

## 2022-10-08 DIAGNOSIS — F988 Other specified behavioral and emotional disorders with onset usually occurring in childhood and adolescence: Secondary | ICD-10-CM

## 2022-10-09 NOTE — Telephone Encounter (Signed)
Refills pending.

## 2022-10-10 ENCOUNTER — Telehealth: Payer: Self-pay | Admitting: Internal Medicine

## 2022-10-10 MED ORDER — ALBUTEROL SULFATE HFA 108 (90 BASE) MCG/ACT IN AERS: INHALATION_SPRAY | RESPIRATORY_TRACT | 2 refills | Status: DC

## 2022-10-10 MED ORDER — AMPHETAMINE-DEXTROAMPHETAMINE 10 MG PO TABS
10.0000 mg | ORAL_TABLET | Freq: Two times a day (BID) | ORAL | 0 refills | Status: DC
Start: 2022-10-10 — End: 2023-01-22

## 2022-10-10 NOTE — Telephone Encounter (Signed)
Pt has not received her rescue inhaler pls advise

## 2022-10-10 NOTE — Telephone Encounter (Signed)
Prescription Request  10/10/2022  LOV: 07/16/2022  What is the name of the medication or equipment? amphetamine-dextroamphetamine (ADDERALL) 10 MG tablet   Have you contacted your pharmacy to request a refill? No   Which pharmacy would you like this sent to?  [id number] - [place], [place] - [address] [place] [address] Phone: [phone] Fax: [phone]     Patient notified that their request is being sent to the clinical staff for review and that they should receive a response within 2 business days.   Please advise at Mobile [phone] (mobile)

## 2022-10-10 NOTE — Telephone Encounter (Signed)
Refills sent

## 2022-10-25 ENCOUNTER — Ambulatory Visit (HOSPITAL_BASED_OUTPATIENT_CLINIC_OR_DEPARTMENT_OTHER)
Admission: RE | Admit: 2022-10-25 | Discharge: 2022-10-25 | Disposition: A | Discharge status: Home / Self Care | Payer: BC Managed Care – PPO | Source: Ambulatory Visit | Attending: Endocrinology | Admitting: Endocrinology

## 2022-10-25 DIAGNOSIS — Z136 Encounter for screening for cardiovascular disorders: Secondary | ICD-10-CM | POA: Diagnosis not present

## 2022-10-25 DIAGNOSIS — E119 Type 2 diabetes mellitus without complications: Secondary | ICD-10-CM | POA: Insufficient documentation

## 2022-11-19 ENCOUNTER — Encounter: Payer: Self-pay | Admitting: Internal Medicine

## 2022-11-21 MED ORDER — TIRZEPATIDE 10 MG/0.5ML ~~LOC~~ SOAJ: 10.0000 mg | SUBCUTANEOUS | 0 refills | Status: DC

## 2023-01-22 ENCOUNTER — Encounter: Payer: Self-pay | Admitting: Internal Medicine

## 2023-01-22 DIAGNOSIS — F909 Attention-deficit hyperactivity disorder, unspecified type: Secondary | ICD-10-CM

## 2023-01-23 MED ORDER — AMPHETAMINE-DEXTROAMPHETAMINE 10 MG PO TABS: 10.0000 mg | ORAL_TABLET | Freq: Two times a day (BID) | ORAL | 0 refills | Status: DC

## 2023-01-23 MED ORDER — TIRZEPATIDE 10 MG/0.5ML ~~LOC~~ SOAJ: 10.0000 mg | SUBCUTANEOUS | 0 refills | Status: DC

## 2023-01-23 MED ORDER — AMPHETAMINE-DEXTROAMPHETAMINE 10 MG PO TABS
10.0000 mg | ORAL_TABLET | Freq: Two times a day (BID) | ORAL | 0 refills | Status: DC
Start: 2023-01-23 — End: 2023-09-11

## 2023-01-23 NOTE — Addendum Note (Signed)
Addended by: Kern Reap B on: 01/23/2023 07:41 AM   Modules accepted: Orders

## 2023-02-11 DIAGNOSIS — E039 Hypothyroidism, unspecified: Secondary | ICD-10-CM | POA: Diagnosis not present

## 2023-02-11 DIAGNOSIS — E2749 Other adrenocortical insufficiency: Secondary | ICD-10-CM | POA: Diagnosis not present

## 2023-02-11 DIAGNOSIS — E119 Type 2 diabetes mellitus without complications: Secondary | ICD-10-CM | POA: Diagnosis not present

## 2023-02-11 DIAGNOSIS — E7800 Pure hypercholesterolemia, unspecified: Secondary | ICD-10-CM | POA: Diagnosis not present

## 2023-02-20 DIAGNOSIS — E039 Hypothyroidism, unspecified: Secondary | ICD-10-CM | POA: Diagnosis not present

## 2023-02-20 DIAGNOSIS — E7800 Pure hypercholesterolemia, unspecified: Secondary | ICD-10-CM | POA: Diagnosis not present

## 2023-02-20 DIAGNOSIS — E119 Type 2 diabetes mellitus without complications: Secondary | ICD-10-CM | POA: Diagnosis not present

## 2023-02-20 DIAGNOSIS — E2749 Other adrenocortical insufficiency: Secondary | ICD-10-CM | POA: Diagnosis not present

## 2023-02-24 ENCOUNTER — Other Ambulatory Visit: Payer: Self-pay | Admitting: Internal Medicine

## 2023-02-25 MED ORDER — TIRZEPATIDE 10 MG/0.5ML ~~LOC~~ SOAJ: 10.0000 mg | SUBCUTANEOUS | 0 refills | Status: DC

## 2023-02-27 ENCOUNTER — Other Ambulatory Visit (HOSPITAL_COMMUNITY): Payer: Self-pay

## 2023-02-27 ENCOUNTER — Telehealth: Payer: Self-pay

## 2023-02-27 ENCOUNTER — Telehealth: Payer: Self-pay | Admitting: Internal Medicine

## 2023-02-27 NOTE — Telephone Encounter (Signed)
Pt called to say  [id number] - [place], [place] - [address] Phone: [phone]  Fax: [phone]    is still waiting for the PA for the tirzepatide [facility]) 10 MG/0.5ML Pen

## 2023-02-27 NOTE — Telephone Encounter (Signed)
Pharmacy Patient Advocate Encounter   Received notification from Pt Calls Messages that prior authorization for Mounjaro 10MG /0.5ML auto-injectors is required/requested.   Insurance verification completed.   The patient is insured through [employer] .   Per test claim: PA required and submitted KEY/EOC/Request #: [id number] APPROVED from 01/28/23 to 02/27/24. Ran test claim, Copay is $44.10. This test claim was processed through [facility]- copay amounts may vary at other pharmacies due to pharmacy/plan contracts, or as the patient moves through the different stages of their insurance plan.    PA Case ID #: [id number]

## 2023-02-27 NOTE — Telephone Encounter (Signed)
PA request has been Approved. New Encounter created for follow up. For additional info see Pharmacy Prior Auth telephone encounter from 02/27/23.

## 2023-05-02 ENCOUNTER — Encounter: Payer: Self-pay | Admitting: Plastic Surgery

## 2023-05-02 ENCOUNTER — Ambulatory Visit (INDEPENDENT_AMBULATORY_CARE_PROVIDER_SITE_OTHER): Payer: Self-pay | Admitting: Plastic Surgery

## 2023-05-02 DIAGNOSIS — Z719 Counseling, unspecified: Secondary | ICD-10-CM

## 2023-05-02 NOTE — Progress Notes (Signed)

## 2023-06-29 ENCOUNTER — Other Ambulatory Visit: Payer: Self-pay | Admitting: Internal Medicine

## 2023-06-29 DIAGNOSIS — J4530 Mild persistent asthma, uncomplicated: Secondary | ICD-10-CM

## 2023-07-23 ENCOUNTER — Other Ambulatory Visit: Payer: Self-pay | Admitting: Internal Medicine

## 2023-07-23 DIAGNOSIS — Z1231 Encounter for screening mammogram for malignant neoplasm of breast: Secondary | ICD-10-CM

## 2023-07-24 ENCOUNTER — Ambulatory Visit: Admitting: Internal Medicine

## 2023-07-24 ENCOUNTER — Encounter: Payer: Self-pay | Admitting: Internal Medicine

## 2023-07-24 VITALS — BP 107/74 | HR 87 | Ht 63.0 in | Wt 156.0 lb

## 2023-07-24 DIAGNOSIS — Z7951 Long term (current) use of inhaled steroids: Secondary | ICD-10-CM

## 2023-07-24 DIAGNOSIS — J4530 Mild persistent asthma, uncomplicated: Secondary | ICD-10-CM

## 2023-07-24 DIAGNOSIS — J45909 Unspecified asthma, uncomplicated: Secondary | ICD-10-CM | POA: Diagnosis not present

## 2023-07-24 MED ORDER — BUDESONIDE-FORMOTEROL FUMARATE 80-4.5 MCG/ACT IN AERO: INHALATION_SPRAY | RESPIRATORY_TRACT | 12 refills | Status: AC

## 2023-07-24 MED ORDER — MONTELUKAST SODIUM 10 MG PO TABS: 10.0000 mg | ORAL_TABLET | Freq: Every day | ORAL | 11 refills | Status: AC

## 2023-07-24 NOTE — Assessment & Plan Note (Addendum)
 Onset age 63 Spirometry 02/13/2016  Nl x mid flows reduced to 56% and min curvature  pre - saba on qvar  80 2bid and pred 10 mg daily > tapered off as of 06/26/2016 only on physiologic steroids per dr Lorrie Rothman guidance  - 12/27/2016     continue qvar  80 2bid - Spirometry 02/20/2018  FEV1 1.6 (78%)  Ratio 68 with mild curvature > 6 h since last saba  - FENO 02/20/2018  =   147 on qvar  80 2bid  - 02/20/2018    try step up to symb 160 2bid from qvar  and off chronic pred mid dec 2019 - 09/17/2018  After extensive coaching inhaler device,  effectiveness =    90% and continue singulair   Mild flare of rhinitis off singulair  so rec restart maint rx with singulair  with zyrtec prn   Symbicort  on the other hand can now be used in the 80 strength prn Based on two studies from NEJM  378; 20 p 1865 (2018) and 380 : p2020-30 (2019) in pts with mild asthma it is reasonable to use low dose symbicort  eg 80 2bid "prn" flare in this setting but I emphasized this was only shown with symbicort  and takes advantage of the rapid onset of action but is not the same as "rescue therapy" but can be stopped once the acute symptoms have resolved and the need for rescue has been minimized (< 2 x weekly)    F/u can be yearly, sooner prn   Each maintenance medication was reviewed in detail including emphasizing most importantly the difference between maintenance and prns and under what circumstances the prns are to be triggered using an action plan format where appropriate.  Total time for H and P, chart review, counseling, reviewing hfa  device(s) and generating customized AVS unique to this office visit / same day charting = 30 min annual asthma/ rhinitis eval.

## 2023-07-24 NOTE — Patient Instructions (Addendum)
 Symbicort  (breyna ) 80 up to Take 2 puffs first thing in am and then another 2 puffs about 12 hours later.   Resume singulair  daily as a maintenance  When nasal symptoms flare can use zyrtec to back up singulair  (or allergra/ clariton) as needed   If any problem with breathing coughing wheezing be sure to stay of the full dose for at least a week before tapering.  Please schedule a follow up visit in 12  months but call sooner if needed

## 2023-07-24 NOTE — Progress Notes (Signed)
 Subjective:    Patient ID: Carrie [name], female   DOB: Apr 18, 1960    MRN: [id number]  Brief patient profile:  21 yobf never smoker onset asthma age 63 and placed on shots for "everything that grows" needed to stay on inhalers / complicated by 3 admits requiring ventilator last time around age 56 and overall much better since then but intermittently on prednisone  and as of 2011 required daily prednisone  so referred to pulmonary clinic 02/13/2016 by Dr   Irine Manning allergy shots but off  since around 2000    History of Present Illness  02/13/2016 1st [facility] office visit/ Carrie [name]  maint rx qvar  80 2bid/ singiulair/ pred 10  Chief Complaint  Patient presents with   Pulmonary Consult    Referred by Dr. Ena Harries for eval of Asthma. Pt states that she has been on prednisone  for years for her asthma. She is currently not having any symptoms.   for the last six years only comes off prednisone  for a few days then back on it due to wheeze/ sob s much cough and presently on 10 mg daily and qvar  80 2 every 12 hours and no saba x sev weeks   Apparently doesn't taper below 10 in any increment Has intermittent hb on prn ppi  rec Pantoprazole  (protonix ) 40 mg   Take  30-60 min before first meal of the day and Pepcid  (famotidine )  20 mg one @  bedtime until return to office    Qvar  80 Take 2 puffs first thing in am and then another 2 puffs about 12 hours later.  Work on inhaler technique:     Only use your albuterol  as a rescue medication  Try Prednisone  5 mg daily x 2 weeks then  5 alternating with 2.5 mg (one-half pill) - if worse resume previous dose    03/19/2018  f/u ov/Carrie [name] re: chronic asthma / off pred mid Dec 2019 / maint symb 160 2bid and no rescue on hand  Chief Complaint  Patient presents with   Follow-up    Breathing is doing well and she has not used her albuterol  inhaler recently. No new co's.   Dyspnea:  Not limited by breathing from desired activities / but by L  knee Cough: no issue  Sleeping: ok now flat/ one pillow  SABA use: no rescue  rec Plan A = Automatic = symbicort  160 Take 2 puffs first thing in am and then another 2 puffs about 12 hours later.  Plan B = Backup Only use your albuterol  inhaler as a rescue medication Plan C = Crisis - only use your albuterol  nebulizer if you first try Plan B and it fails to help > ok to use the nebulizer up to every 4 hours but if start needing it regularly call for immediate appointment      05/21/2021  f/u ov/Carrie [name] re: annual eval of previously poorly controlled  asthma no longer pred dep/ symbicort  160 1 bid and singulair  with no flares over last year  Chief Complaint  Patient presents with   Follow-up    No concerns at this time  Dyspnea:  walking on job, steps are fine  Cough: tolerable  Sleeping: no resp cc  < 30 cc electric bed  SABA use: none  02: none  Covid status:   twice infected/ 3 x infected  Rec Plan A = Automatic = Always=    symbicort  1-2 every 12 hours and singulair  each pm  Plan  B = Backup (to supplement plan A, not to replace it) Only use your albuterol  inhaler as a rescue medication  In the event of a flare of your symptoms > symbicort  160 is 2 every 12 hours for at least a week then taper   Please schedule a follow up visit in 12  months but call sooner if needed    07/16/2022  f/u ov/Carrie [name] re:  asthma  maint on symbicort  160 2 daily  / singulair   Chief Complaint  Patient presents with   Follow-up    Doing well.  Dyspnea:  improved with wt loss  Cough: none  Sleeping: no resp cc SABA use: none  Rec No change in medications except try off singulair  to see if you any difference        07/24/2023  f/u ov/Carrie [name] office/Carrie [name] re: asthma  maint on nothing x one month and worse rhinitis symptoms off singulair   Chief Complaint  Patient presents with   Asthma  Dyspnea:  never / lots of walking  Cough: no / some drippy nose  Sleeping: 30 degrees for neck one pillow     resp cc  SABA use: not needing     No obvious day to day or daytime variability or assoc excess/ purulent sputum or mucus plugs or hemoptysis or cp or chest tightness, subjective wheeze or overt sinus or hb symptoms.    Also denies any obvious fluctuation of symptoms with weather or environmental changes or other aggravating or alleviating factors except as outlined above   No unusual exposure hx or h/o childhood pna  or knowledge of premature birth.  Current Allergies, Complete Past Medical History, Past Surgical History, Family History, and Social History were reviewed in [employer] record.  ROS  The following are not active complaints unless bolded Hoarseness, sore throat, dysphagia, dental problems, itching, sneezing,  nasal congestion or discharge of excess mucus or purulent secretions, ear ache,   fever, chills, sweats, unintended wt loss or wt gain, classically pleuritic or exertional cp,  orthopnea pnd or arm/hand swelling  or leg swelling, presyncope, palpitations, abdominal pain, anorexia, nausea, vomiting, diarrhea  or change in bowel habits or change in bladder habits, change in stools or change in urine, dysuria, hematuria,  rash, arthralgias, visual complaints, headache, numbness, weakness or ataxia or problems with walking or coordination,  change in mood or  memory.        Current Meds  Medication Sig   albuterol  (VENTOLIN  HFA) 108 (90 Base) MCG/ACT inhaler INHALE 2 INHALATIONS BY MOUTH EVERY 6 HOURS AS NEEDED   amphetamine -dextroamphetamine  (ADDERALL) 10 MG tablet Take 1 tablet (10 mg total) by mouth 2 (two) times daily.   amphetamine -dextroamphetamine  (ADDERALL) 10 MG tablet Take 1 tablet (10 mg total) by mouth 2 (two) times daily.   amphetamine -dextroamphetamine  (ADDERALL) 10 MG tablet Take 1 tablet (10 mg total) by mouth 2 (two) times daily.   atorvastatin  (LIPITOR) 20 MG tablet TAKE 1 TABLET(20 MG) BY MOUTH DAILY   budesonide -formoterol  (SYMBICORT )  160-4.5 MCG/ACT inhaler Inhale 2 puffs into the lungs 2 (two) times daily.   levothyroxine  (SYNTHROID ) 75 MCG tablet Take 75 mcg by mouth daily before breakfast.   pantoprazole  (PROTONIX ) 40 MG tablet TAKE 1 TABLET BY MOUTH 30 TO 60 MINUTES BEFORE FIRST MEAL OF THE DAY   tirzepatide  (MOUNJARO ) 10 MG/0.5ML Pen Inject 10 mg into the skin once a week.  Objective:   Physical Exam   Wt  07/24/2023         156  07/16/2022       154  05/21/2021         184  03/21/2020         201 03/22/2019         197   09/17/2018        203  03/19/2018         206  02/20/2018       203  12/27/2016     173  06/26/2016        188  04/16/2016       191   02/13/16 203 lb 3.2 oz (92.2 kg)  02/05/16 200 lb (90.7 kg)  01/24/16 200 lb (90.7 kg)    Vital signs reviewed  07/24/2023  - Note at rest 02 sats  98% on RA   General appearance:    amb pleasant bf nad   HEENT : Oropharynx  clear      Nasal turbinates nl    NECK :  without  apparent JVD/ palpable Nodes/TM    LUNGS: no acc muscle use,  Nl contour chest which is clear to A and P bilaterally without cough on insp or exp maneuvers   CV:  RRR  no s3 or murmur or increase in P2, and no edema   ABD:  soft and nontender   MS:  Gait nl   ext warm without deformities Or obvious joint restrictions  calf tenderness, cyanosis or clubbing    SKIN: warm and dry without lesions    NEURO:  alert, approp, nl sensorium with  no motor or cerebellar deficits apparent.     Assessment:

## 2023-07-30 ENCOUNTER — Ambulatory Visit
Admission: RE | Admit: 2023-07-30 | Discharge: 2023-07-30 | Disposition: A | Discharge status: Home / Self Care | Source: Ambulatory Visit | Attending: Internal Medicine | Admitting: Internal Medicine

## 2023-07-30 DIAGNOSIS — Z1231 Encounter for screening mammogram for malignant neoplasm of breast: Secondary | ICD-10-CM

## 2023-08-13 ENCOUNTER — Other Ambulatory Visit: Payer: Self-pay | Admitting: Internal Medicine

## 2023-08-21 DIAGNOSIS — E538 Deficiency of other specified B group vitamins: Secondary | ICD-10-CM | POA: Diagnosis not present

## 2023-08-21 DIAGNOSIS — E039 Hypothyroidism, unspecified: Secondary | ICD-10-CM | POA: Diagnosis not present

## 2023-08-21 DIAGNOSIS — E119 Type 2 diabetes mellitus without complications: Secondary | ICD-10-CM | POA: Diagnosis not present

## 2023-08-21 DIAGNOSIS — E7800 Pure hypercholesterolemia, unspecified: Secondary | ICD-10-CM | POA: Diagnosis not present

## 2023-08-21 LAB — LIPID PANEL
Cholesterol: 145 (ref 0–200)
HDL: 53 (ref 35–70)
LDL Cholesterol: 77
Triglycerides: 75 (ref 40–160)

## 2023-08-21 LAB — BASIC METABOLIC PANEL WITH GFR: Glucose: 140

## 2023-08-21 LAB — VITAMIN B12: Vitamin B-12: 872

## 2023-08-21 LAB — TSH: TSH: 3.24 (ref 0.41–5.90)

## 2023-08-21 LAB — HEMOGLOBIN A1C: Hemoglobin A1C: 5.5

## 2023-08-28 DIAGNOSIS — E7800 Pure hypercholesterolemia, unspecified: Secondary | ICD-10-CM | POA: Diagnosis not present

## 2023-08-28 DIAGNOSIS — E039 Hypothyroidism, unspecified: Secondary | ICD-10-CM | POA: Diagnosis not present

## 2023-08-28 DIAGNOSIS — E119 Type 2 diabetes mellitus without complications: Secondary | ICD-10-CM | POA: Diagnosis not present

## 2023-08-28 DIAGNOSIS — E2749 Other adrenocortical insufficiency: Secondary | ICD-10-CM | POA: Diagnosis not present

## 2023-09-01 ENCOUNTER — Encounter: Payer: Self-pay | Admitting: Internal Medicine

## 2023-09-10 ENCOUNTER — Encounter: Payer: Self-pay | Admitting: Internal Medicine

## 2023-09-10 DIAGNOSIS — F909 Attention-deficit hyperactivity disorder, unspecified type: Secondary | ICD-10-CM

## 2023-09-11 MED ORDER — AMPHETAMINE-DEXTROAMPHETAMINE 10 MG PO TABS: 10.0000 mg | ORAL_TABLET | Freq: Two times a day (BID) | ORAL | 0 refills | Status: DC

## 2023-11-19 ENCOUNTER — Other Ambulatory Visit: Payer: Self-pay | Admitting: Internal Medicine

## 2024-01-06 ENCOUNTER — Encounter: Payer: Self-pay | Admitting: Internal Medicine

## 2024-01-06 DIAGNOSIS — F909 Attention-deficit hyperactivity disorder, unspecified type: Secondary | ICD-10-CM

## 2024-01-07 MED ORDER — AMPHETAMINE-DEXTROAMPHETAMINE 10 MG PO TABS: 10.0000 mg | ORAL_TABLET | Freq: Two times a day (BID) | ORAL | 0 refills | Status: DC

## 2024-01-26 ENCOUNTER — Other Ambulatory Visit (HOSPITAL_COMMUNITY): Payer: Self-pay

## 2024-01-26 ENCOUNTER — Telehealth: Payer: Self-pay

## 2024-01-26 NOTE — Telephone Encounter (Signed)
 Pharmacy Patient Advocate Encounter   Received notification from Onbase that prior authorization for Mounjaro  10 is required/requested.   Insurance verification completed.   The patient is insured through [employer].   Per test claim: Refill too soon. PA is not needed at this time. Medication was filled 01/08/24. Next eligible fill date is 02/05/24.   Patient has current approved PA that expires 02/27/24. Will submit for renewal closer to that time.

## 2024-01-29 ENCOUNTER — Other Ambulatory Visit (HOSPITAL_COMMUNITY): Payer: Self-pay

## 2024-01-30 ENCOUNTER — Other Ambulatory Visit (HOSPITAL_COMMUNITY): Payer: Self-pay

## 2024-02-06 ENCOUNTER — Telehealth: Payer: Self-pay

## 2024-02-06 ENCOUNTER — Other Ambulatory Visit (HOSPITAL_COMMUNITY): Payer: Self-pay

## 2024-02-06 NOTE — Telephone Encounter (Signed)
 Pharmacy Patient Advocate Encounter   Received notification from Onbase that prior authorization for Mounjaro  10 is due for renewal.   Insurance verification completed.   The patient is insured through [employer].  Action: PA required and submitted KEY/EOC/Request #: [id number] from 01/07/24 to 02/05/25. Ran test claim, Copay is $108.58. This test claim was processed through [facility]- copay amounts may vary at other pharmacies due to pharmacy/plan contracts, or as the patient moves through the different stages of their insurance plan.

## 2024-02-20 ENCOUNTER — Other Ambulatory Visit: Payer: Self-pay | Admitting: Internal Medicine

## 2024-02-23 NOTE — Telephone Encounter (Signed)
 07/16/22 last office visit.

## 2024-02-26 DIAGNOSIS — E7800 Pure hypercholesterolemia, unspecified: Secondary | ICD-10-CM | POA: Diagnosis not present

## 2024-02-26 DIAGNOSIS — E119 Type 2 diabetes mellitus without complications: Secondary | ICD-10-CM | POA: Diagnosis not present

## 2024-02-26 DIAGNOSIS — E039 Hypothyroidism, unspecified: Secondary | ICD-10-CM | POA: Diagnosis not present

## 2024-03-04 DIAGNOSIS — E7800 Pure hypercholesterolemia, unspecified: Secondary | ICD-10-CM | POA: Diagnosis not present

## 2024-03-04 DIAGNOSIS — E039 Hypothyroidism, unspecified: Secondary | ICD-10-CM | POA: Diagnosis not present

## 2024-03-04 DIAGNOSIS — E119 Type 2 diabetes mellitus without complications: Secondary | ICD-10-CM | POA: Diagnosis not present

## 2024-03-04 DIAGNOSIS — E2749 Other adrenocortical insufficiency: Secondary | ICD-10-CM | POA: Diagnosis not present

## 2024-04-07 ENCOUNTER — Encounter: Payer: Self-pay | Admitting: Internal Medicine

## 2024-04-08 ENCOUNTER — Other Ambulatory Visit: Payer: Self-pay | Admitting: *Deleted

## 2024-04-08 DIAGNOSIS — F909 Attention-deficit hyperactivity disorder, unspecified type: Secondary | ICD-10-CM

## 2024-04-08 MED ORDER — AMPHETAMINE-DEXTROAMPHETAMINE 10 MG PO TABS: 10.0000 mg | ORAL_TABLET | Freq: Two times a day (BID) | ORAL | 0 refills | Status: DC

## 2024-04-08 NOTE — Telephone Encounter (Signed)
 Copied from CRM #[id number]. Topic: Clinical - Medication Refill >> Apr 08, 2024 10:51 AM Montie POUR wrote: Medication:  amphetamine -dextroamphetamine  (ADDERALL) 10 MG tablet  Has the patient contacted their pharmacy? Yes (Agent: If no, request that the patient contact the pharmacy for the refill. If patient does not wish to contact the pharmacy document the reason why and proceed with request.) (Agent: If yes, when and what did the pharmacy advise?)Pharmacy needs order to refill This is the patient's preferred pharmacy:  [id number] - [place], [place] - [address] [place] [address] Phone: [phone] Fax: [phone]  Is this the correct pharmacy for this prescription? Yes If no, delete pharmacy and type the correct one.   Has the prescription been filled recently? No  Is the patient out of the medication? No - She will be out on 04/13/24 and would like Dr. Coralie  to go ahead and send in medication refill so she can it up on 04/13/24  Has the patient been seen for an appointment in the last year OR does the patient have an upcoming appointment? Yes - She made an appointment for 04/13/24  Can we respond through MyChart? Yes  Agent: Please be advised that Rx refills may take up to 3 business days. We ask that you follow-up with your pharmacy.   Patient has scheduled an appointment for 04/13/24

## 2024-04-13 ENCOUNTER — Encounter: Payer: Self-pay | Admitting: Internal Medicine

## 2024-04-13 ENCOUNTER — Telehealth: Payer: Self-pay | Admitting: Internal Medicine

## 2024-04-13 ENCOUNTER — Telehealth: Admitting: Internal Medicine

## 2024-04-13 DIAGNOSIS — F909 Attention-deficit hyperactivity disorder, unspecified type: Secondary | ICD-10-CM | POA: Diagnosis not present

## 2024-04-13 MED ORDER — AMPHETAMINE-DEXTROAMPHETAMINE 10 MG PO TABS: 10.0000 mg | ORAL_TABLET | Freq: Two times a day (BID) | ORAL | 0 refills | Status: AC

## 2024-04-13 NOTE — Progress Notes (Signed)
 "   Virtual Visit via Video Note  I connected with Carrie [name] on 04/13/24 at  1:30 PM EST by a video enabled telemedicine application and verified that I am speaking with the correct person using two identifiers.  Location patient: home Location provider: work office Persons participating in the virtual visit: patient, provider  I discussed the limitations of evaluation and management by telemedicine and the availability of in person appointments. The patient expressed understanding and agreed to proceed.   HPI: This visit has been scheduled for medication refills.  She has a history of ADHD and takes 10 mg of Adderall daily.  She has scheduled her annual physical for April.  Is feeling well in general.   ROS: Negative unless indicated in HPI.  Past Medical History:  Diagnosis Date   A-fib (HCC)    ADD (attention deficit disorder)    Adrenal abnormality    Anemia, iron deficiency    Asthma    Fibromyalgia    GERD (gastroesophageal reflux disease)    Hiatal hernia    Hyperlipidemia    Numbness and tingling    Obese    Thyroid  disease     Past Surgical History:  Procedure Laterality Date   ABDOMINAL HYSTERECTOMY     COLONOSCOPY     KNEE SURGERY     SHOULDER SURGERY     TONSILLECTOMY     TOTAL KNEE ARTHROPLASTY Left 01/15/2019   Procedure: TOTAL KNEE ARTHROPLASTY;  Surgeon: Gerome Charleston, MD;  Location: [facility];  Service: Orthopedics;  Laterality: Left;    Family History  Problem Relation Age of Onset   Asthma Mother        seasonal   Colon cancer Mother    Esophageal cancer Father        smoker, mets to lungs   Colon polyps Father    Diabetes Sister    Lung cancer Maternal Uncle    Rectal cancer Neg Hx    Stomach cancer Neg Hx    Breast cancer Neg Hx     SOCIAL HX:   reports that she has never smoked. She has never used smokeless tobacco. She reports current alcohol use. She reports that she does not use drugs.  Current Medications[1]  EXAM:    VITALS per patient if applicable: None reported  GENERAL: alert, oriented, appears well and in no acute distress  HEENT: atraumatic, conjunttiva clear, no obvious abnormalities on inspection of external nose and ears  NECK: normal movements of the head and neck  LUNGS: on inspection no signs of respiratory distress, breathing rate appears normal, no obvious gross increased work of breathing, gasping or wheezing  CV: no obvious cyanosis  MS: moves all visible extremities without noticeable abnormality  PSYCH/NEURO: pleasant and cooperative, no obvious depression or anxiety, speech and thought processing grossly intact  ASSESSMENT AND PLAN:   Attention deficit hyperactivity disorder (ADHD), unspecified ADHD type - Plan: amphetamine -dextroamphetamine  (ADDERALL) 10 MG tablet, amphetamine -dextroamphetamine  (ADDERALL) 10 MG tablet, amphetamine -dextroamphetamine  (ADDERALL) 10 MG tablet  -PDMP reviewed, no red flags, overdose risk core is 160. -Refill Adderall 10 mg to take daily for total of 30 tablets a month x 3 months.   I discussed the assessment and treatment plan with the patient. The patient was provided an opportunity to ask questions and all were answered. The patient agreed with the plan and demonstrated an understanding of the instructions.   The patient was advised to call back or seek an in-person evaluation if the symptoms worsen  or if the condition fails to improve as anticipated.    Tully Theophilus Andrews, MD  [facility]    [1]  Current Outpatient Medications:    atorvastatin  (LIPITOR) 20 MG tablet, TAKE 1 TABLET(20 MG) BY MOUTH DAILY, Disp: 90 tablet, Rfl: 0   budesonide -formoterol  (SYMBICORT ) 80-4.5 MCG/ACT inhaler, Take 2 puffs first thing in am and then another 2 puffs about 12 hours later., Disp: 1 each, Rfl: 12   levothyroxine  (SYNTHROID ) 75 MCG tablet, Take 75 mcg by mouth daily before breakfast., Disp: , Rfl:    montelukast   (SINGULAIR ) 10 MG tablet, Take 1 tablet (10 mg total) by mouth at bedtime., Disp: 30 tablet, Rfl: 11   MOUNJARO  10 MG/0.5ML Pen, INJECT 0.5ml (10MG ) INTO THE SKIN ONCE WEEKLY, Disp: 6 mL, Rfl: 0   pantoprazole  (PROTONIX ) 40 MG tablet, TAKE 1 TABLET BY MOUTH 30 TO 60 MINUTES BEFORE FIRST MEAL OF THE DAY, Disp: 90 tablet, Rfl: 3   amphetamine -dextroamphetamine  (ADDERALL) 10 MG tablet, Take 1 tablet (10 mg total) by mouth 2 (two) times daily., Disp: 60 tablet, Rfl: 0   amphetamine -dextroamphetamine  (ADDERALL) 10 MG tablet, Take 1 tablet (10 mg total) by mouth 2 (two) times daily., Disp: 60 tablet, Rfl: 0   amphetamine -dextroamphetamine  (ADDERALL) 10 MG tablet, Take 1 tablet (10 mg total) by mouth 2 (two) times daily., Disp: 60 tablet, Rfl: 0  "

## 2024-04-13 NOTE — Progress Notes (Signed)
 Per patient no change in vitals since last visit, unable to obtain new vitals due to telehealth visit.

## 2024-04-13 NOTE — Telephone Encounter (Signed)
 LVM for pt to call office to reschedule physical appt for a sooner date.  This was ok'd by Dr. Hernandez//tes

## 2024-07-14 ENCOUNTER — Encounter: Admitting: Internal Medicine
